# Patient Record
Sex: Female | Born: 1965
Health system: Southern US, Community
[De-identification: ages and names within clinical notes are randomized; demographics above are authoritative.]

## PROBLEM LIST (undated history)

## (undated) DIAGNOSIS — G4700 Insomnia, unspecified: Secondary | ICD-10-CM

## (undated) DIAGNOSIS — M8430XA Stress fracture, unspecified site, initial encounter for fracture: Secondary | ICD-10-CM

## (undated) DIAGNOSIS — N946 Dysmenorrhea, unspecified: Secondary | ICD-10-CM

## (undated) DIAGNOSIS — E782 Mixed hyperlipidemia: Secondary | ICD-10-CM

## (undated) DIAGNOSIS — F411 Generalized anxiety disorder: Secondary | ICD-10-CM

## (undated) DIAGNOSIS — F329 Major depressive disorder, single episode, unspecified: Secondary | ICD-10-CM

## (undated) DIAGNOSIS — K760 Fatty (change of) liver, not elsewhere classified: Secondary | ICD-10-CM

## (undated) DIAGNOSIS — F32A Depression, unspecified: Secondary | ICD-10-CM

## (undated) DIAGNOSIS — T7840XA Allergy, unspecified, initial encounter: Secondary | ICD-10-CM

## (undated) DIAGNOSIS — N951 Menopausal and female climacteric states: Secondary | ICD-10-CM

## (undated) DIAGNOSIS — D259 Leiomyoma of uterus, unspecified: Secondary | ICD-10-CM

## (undated) DIAGNOSIS — M47816 Spondylosis without myelopathy or radiculopathy, lumbar region: Secondary | ICD-10-CM

## (undated) DIAGNOSIS — R1013 Epigastric pain: Secondary | ICD-10-CM

## (undated) DIAGNOSIS — J302 Other seasonal allergic rhinitis: Secondary | ICD-10-CM

## (undated) DIAGNOSIS — O24419 Gestational diabetes mellitus in pregnancy, unspecified control: Secondary | ICD-10-CM

## (undated) DIAGNOSIS — M542 Cervicalgia: Secondary | ICD-10-CM

## (undated) DIAGNOSIS — B009 Herpesviral infection, unspecified: Secondary | ICD-10-CM

## (undated) DIAGNOSIS — N920 Excessive and frequent menstruation with regular cycle: Secondary | ICD-10-CM

## (undated) DIAGNOSIS — R140 Abdominal distension (gaseous): Secondary | ICD-10-CM

## (undated) DIAGNOSIS — K589 Irritable bowel syndrome without diarrhea: Secondary | ICD-10-CM

## (undated) DIAGNOSIS — Z86010 Personal history of colonic polyps: Secondary | ICD-10-CM

## (undated) DIAGNOSIS — M533 Sacrococcygeal disorders, not elsewhere classified: Secondary | ICD-10-CM

## (undated) HISTORY — DX: Insomnia, unspecified: G47.00

## (undated) HISTORY — DX: Excessive and frequent menstruation with regular cycle: N92.0

## (undated) HISTORY — DX: Major depressive disorder, single episode, unspecified: F32.9

## (undated) HISTORY — DX: Generalized anxiety disorder: F41.1

## (undated) HISTORY — DX: Abdominal distension (gaseous): R14.0

## (undated) HISTORY — DX: Gestational diabetes mellitus in pregnancy, unspecified control: O24.419

## (undated) HISTORY — DX: Leiomyoma of uterus, unspecified: D25.9

## (undated) HISTORY — DX: Personal history of colonic polyps: Z86.010

## (undated) HISTORY — DX: Herpesviral infection, unspecified: B00.9

## (undated) HISTORY — DX: Sacrococcygeal disorders, not elsewhere classified: M53.3

## (undated) HISTORY — DX: Fatty (change of) liver, not elsewhere classified: K76.0

## (undated) HISTORY — DX: Irritable bowel syndrome, unspecified: K58.9

## (undated) HISTORY — DX: Menopausal and female climacteric states: N95.1

## (undated) HISTORY — DX: Allergy, unspecified, initial encounter: T78.40XA

## (undated) HISTORY — DX: Dysmenorrhea, unspecified: N94.6

## (undated) HISTORY — DX: Depression, unspecified: F32.A

## (undated) HISTORY — DX: Epigastric pain: R10.13

## (undated) HISTORY — PX: EYE SURGERY: SHX253

## (undated) HISTORY — DX: Stress fracture, unspecified site, initial encounter for fracture: M84.30XA

## (undated) HISTORY — DX: Cervicalgia: M54.2

## (undated) HISTORY — DX: Mixed hyperlipidemia: E78.2

## (undated) HISTORY — DX: Spondylosis without myelopathy or radiculopathy, lumbar region: M47.816

## (undated) HISTORY — DX: Other seasonal allergic rhinitis: J30.2

---

## 1998-01-11 ENCOUNTER — Other Ambulatory Visit: Admission: RE | Admit: 1998-01-11 | Discharge: 1998-01-11 | Payer: Self-pay | Admitting: Gynecology

## 1999-01-31 ENCOUNTER — Other Ambulatory Visit: Admission: RE | Admit: 1999-01-31 | Discharge: 1999-01-31 | Payer: Self-pay | Admitting: Gynecology

## 2000-02-09 ENCOUNTER — Other Ambulatory Visit: Admission: RE | Admit: 2000-02-09 | Discharge: 2000-02-09 | Payer: Self-pay | Admitting: Obstetrics and Gynecology

## 2001-04-04 ENCOUNTER — Other Ambulatory Visit: Admission: RE | Admit: 2001-04-04 | Discharge: 2001-04-04 | Payer: Self-pay | Admitting: Gynecology

## 2002-04-24 ENCOUNTER — Other Ambulatory Visit: Admission: RE | Admit: 2002-04-24 | Discharge: 2002-04-24 | Payer: Self-pay | Admitting: Obstetrics and Gynecology

## 2002-11-26 ENCOUNTER — Encounter: Payer: Self-pay | Admitting: Gynecology

## 2002-11-26 ENCOUNTER — Ambulatory Visit (HOSPITAL_COMMUNITY): Admission: RE | Admit: 2002-11-26 | Discharge: 2002-11-26 | Payer: Self-pay | Admitting: Gynecology

## 2003-06-15 ENCOUNTER — Other Ambulatory Visit: Admission: RE | Admit: 2003-06-15 | Discharge: 2003-06-15 | Payer: Self-pay | Admitting: Gynecology

## 2004-07-06 ENCOUNTER — Other Ambulatory Visit: Admission: RE | Admit: 2004-07-06 | Discharge: 2004-07-06 | Payer: Self-pay | Admitting: Gynecology

## 2004-07-06 ENCOUNTER — Ambulatory Visit (HOSPITAL_COMMUNITY): Admission: RE | Admit: 2004-07-06 | Discharge: 2004-07-06 | Payer: Self-pay | Admitting: Obstetrics and Gynecology

## 2005-09-01 ENCOUNTER — Other Ambulatory Visit: Admission: RE | Admit: 2005-09-01 | Discharge: 2005-09-01 | Payer: Self-pay | Admitting: Gynecology

## 2006-01-26 ENCOUNTER — Ambulatory Visit (HOSPITAL_COMMUNITY): Admission: RE | Admit: 2006-01-26 | Discharge: 2006-01-26 | Payer: Self-pay | Admitting: Obstetrics and Gynecology

## 2006-07-03 DIAGNOSIS — M8430XA Stress fracture, unspecified site, initial encounter for fracture: Secondary | ICD-10-CM

## 2006-07-03 HISTORY — DX: Stress fracture, unspecified site, initial encounter for fracture: M84.30XA

## 2006-09-07 ENCOUNTER — Other Ambulatory Visit: Admission: RE | Admit: 2006-09-07 | Discharge: 2006-09-07 | Payer: Self-pay | Admitting: Gynecology

## 2007-04-22 ENCOUNTER — Ambulatory Visit (HOSPITAL_COMMUNITY): Admission: RE | Admit: 2007-04-22 | Discharge: 2007-04-22 | Payer: Self-pay | Admitting: Gynecology

## 2008-02-13 ENCOUNTER — Other Ambulatory Visit: Admission: RE | Admit: 2008-02-13 | Discharge: 2008-02-13 | Payer: Self-pay | Admitting: Gynecology

## 2008-05-04 ENCOUNTER — Ambulatory Visit: Payer: Self-pay | Admitting: Women's Health

## 2008-05-19 ENCOUNTER — Ambulatory Visit (HOSPITAL_COMMUNITY): Admission: RE | Admit: 2008-05-19 | Discharge: 2008-05-19 | Payer: Self-pay | Admitting: Gynecology

## 2008-06-30 ENCOUNTER — Ambulatory Visit: Payer: Self-pay | Admitting: Women's Health

## 2008-07-03 HISTORY — PX: ENDOMETRIAL ABLATION: SHX621

## 2008-07-16 ENCOUNTER — Ambulatory Visit: Payer: Self-pay | Admitting: Gynecology

## 2008-07-23 ENCOUNTER — Ambulatory Visit: Payer: Self-pay | Admitting: Gynecology

## 2008-08-27 ENCOUNTER — Ambulatory Visit: Payer: Self-pay | Admitting: Gynecology

## 2008-08-28 ENCOUNTER — Ambulatory Visit: Payer: Self-pay | Admitting: Gynecology

## 2008-09-15 ENCOUNTER — Ambulatory Visit: Payer: Self-pay | Admitting: Gynecology

## 2009-02-25 ENCOUNTER — Ambulatory Visit: Payer: Self-pay | Admitting: Women's Health

## 2009-02-25 ENCOUNTER — Other Ambulatory Visit: Admission: RE | Admit: 2009-02-25 | Discharge: 2009-02-25 | Payer: Self-pay | Admitting: Gynecology

## 2009-02-25 ENCOUNTER — Encounter: Payer: Self-pay | Admitting: Women's Health

## 2009-06-02 ENCOUNTER — Ambulatory Visit (HOSPITAL_COMMUNITY): Admission: RE | Admit: 2009-06-02 | Discharge: 2009-06-02 | Payer: Self-pay | Admitting: Gynecology

## 2010-01-31 LAB — CONVERTED CEMR LAB

## 2010-03-02 ENCOUNTER — Other Ambulatory Visit: Admission: RE | Admit: 2010-03-02 | Discharge: 2010-03-02 | Payer: Self-pay | Admitting: Gynecology

## 2010-03-02 ENCOUNTER — Encounter (INDEPENDENT_AMBULATORY_CARE_PROVIDER_SITE_OTHER): Payer: Self-pay | Admitting: *Deleted

## 2010-03-02 ENCOUNTER — Ambulatory Visit: Payer: Self-pay | Admitting: Women's Health

## 2010-03-02 LAB — CONVERTED CEMR LAB
Glucose, Bld: 86 mg/dL
HCT: 40.5 %
Hemoglobin: 13.3 g/dL
Lymphocytes, automated: 27 %
MCV: 90.1 fL
Platelets: 329 10*3/uL
RBC: 4.5 M/uL
RDW: 13.8 %
TSH: 0.54 microintl units/mL
WBC: 7 10*3/uL

## 2010-03-28 ENCOUNTER — Ambulatory Visit: Payer: Self-pay | Admitting: Women's Health

## 2010-06-15 ENCOUNTER — Ambulatory Visit (HOSPITAL_COMMUNITY)
Admission: RE | Admit: 2010-06-15 | Discharge: 2010-06-15 | Payer: Self-pay | Source: Home / Self Care | Attending: Gynecology | Admitting: Gynecology

## 2010-06-15 LAB — HM MAMMOGRAPHY: HM Mammogram: NEGATIVE

## 2010-07-05 ENCOUNTER — Ambulatory Visit
Admission: RE | Admit: 2010-07-05 | Discharge: 2010-07-05 | Payer: Self-pay | Source: Home / Self Care | Attending: Family Medicine | Admitting: Family Medicine

## 2010-07-05 ENCOUNTER — Encounter: Payer: Self-pay | Admitting: Family Medicine

## 2010-07-05 ENCOUNTER — Telehealth: Payer: Self-pay | Admitting: Family Medicine

## 2010-07-05 DIAGNOSIS — J329 Chronic sinusitis, unspecified: Secondary | ICD-10-CM | POA: Insufficient documentation

## 2010-07-05 DIAGNOSIS — F341 Dysthymic disorder: Secondary | ICD-10-CM | POA: Insufficient documentation

## 2010-07-12 ENCOUNTER — Encounter (INDEPENDENT_AMBULATORY_CARE_PROVIDER_SITE_OTHER): Payer: Self-pay | Admitting: *Deleted

## 2010-07-12 ENCOUNTER — Telehealth (INDEPENDENT_AMBULATORY_CARE_PROVIDER_SITE_OTHER): Payer: Self-pay | Admitting: *Deleted

## 2010-08-02 ENCOUNTER — Encounter: Payer: Self-pay | Admitting: Family Medicine

## 2010-08-04 NOTE — Letter (Signed)
Summary: Records Dated 09-04-96 thru 06-15-10/[facility]  Records Dated 09-04-96 thru 06-15-10/[facility]   Imported By: Lanelle Bal 07/20/2010 08:28:38  _____________________________________________________________________  External Attachment:    Type:   Image     Comment:   External Document

## 2010-08-04 NOTE — Progress Notes (Signed)
Summary: Lab results    Mammogram  Procedure date:  06/15/2010  Findings:       Assessment: BIRADS 1-negative   Comments:      Screening mammogram in 1 year.     Mammogram  Procedure date:  06/02/2009  Findings:       Assessment: BIRADS 1-negative   Comments:      Screening mammogram in 1 year.     -  Date:  03/02/2010    BG Random: 86    WBC: 7.0    HGB: 13.3    HCT: 40.5    RBC: 4.50    PLT: 329    MCV: 90.1    RDW: 13.8    Lymphs: 27.0    TSH: 0.54

## 2010-08-04 NOTE — Progress Notes (Signed)
Summary: Medical Record Request  Phone Note Outgoing Call Call back at [phone]   Call placed by: Lannette Donath,  July 05, 2010 5:01 PM Summary of Call: Faxed medical record request 07-05-10 Initial call taken by: Lannette Donath,  July 05, 2010 5:02 PM

## 2010-08-04 NOTE — Assessment & Plan Note (Signed)
Summary: CPX/VFW   Vital Signs:  Patient profile:   45 year old female Menstrual status:  regular LMP:     06/28/2010 Height:      63.25 inches (160.66 cm) Weight:      123.50 pounds (56.14 kg) BMI:     21.78 O2 Sat:      97 % on Room air Temp:     98.4 degrees F (36.89 degrees C) oral Pulse rate:   74 / minute BP sitting:   130 / 84  (right arm) Cuff size:   regular  Vitals Entered By: Josph Macho RMA (July 05, 2010 1:20 PM)  O2 Flow:  Room air CC: Establish new patient/ physical - no pap/ CF Is Patient Diabetic? No LMP (date): 06/28/2010     Menstrual Status regular Enter LMP: 06/28/2010 Last PAP Result historical   History of Present Illness: patient is a 45 year old Caucasian female in today to establish care. She previously has been seen by [facility] gynecology pregnancy down and has been seen in urgent care several times over the last couple years for sinus infections. At this point she feels she needs a primary care doctor to help her manage her conditions. Her biggest problem over the last year subsequent stress and anxiety. She is having a lot of marital stress segment elevations on for counseling and she's also. Her 2 teenagers in her home and working as well. She found counseling was helpful at a time but does feel she's doing better at this time. Her gynecologist was kind enough to put her on some sertraline 50 mg daily and she takes a very infrequent dose of Xanax and that she feels is controlling her symptoms well. She has a strong family history of anxiety and depression as well as substance abuse and is aware that she needs to be proactive regarding these conditions. She reports otherwise been in good health other than her recurrent sinus infections. She notes several sinus infections requiring antibiotics occurred in the last couple years and she is considering referral to ENT for further evaluation. She does not want one yet today secondary to she hasn't had  an infection in several months. She denies fevers, chills, headache, congestion, chest pain, palpitations, shortness of breath, GI or GU complaints at this time. She did struggle with Gestational Diabetes, was diet controlled and she has not had any futher trouble with her sugar since then. Reports her previous fasting labs have always been acceptable.  Preventive Screening-Counseling & Management  Alcohol-Tobacco     Smoking Status: never  Caffeine-Diet-Exercise     Does Patient Exercise: yes      Drug Use:  no.    Current Medications (verified): 1)  Xanax 0.25 Mg Tabs (Alprazolam) .... As Needed 2)  Zoloft 50 Mg Tabs (Sertraline Hcl) .... Once Daily  Allergies (verified): No Known Drug Allergies  Past History:  Past Surgical History: endometrial ablation, 2010 for menorrhaghia Caesarean section x 1 in 1997 right eye surgery for strabismus at age 45   Family History: Father: 37, A&W, SAD Mother: 75, DMII, HTN, hyperlipidemia, elevated BMI, depression Siblings:  Brother: 65, multiple addictions, cocaine, vicodin, pain killers, bipolar disorder MGM: deceased in 44s, gyn cancer possibly cervical or uterine MGF: deceased@82 , DMII, elevated BMI, HTN, depression, substance dependence. PGM: deceased in 81s or 25s, unknown causes PGF: deceased in 45s or 82s, alcoholism Children: Son: 47 yo, ADHD, dysgraphia, depression Daughter: 70yo, A&W  Social History: Occupation: Psychologist, prison and probation services  Married Never Smoked Drug  use-no Regular exercise-no Alcohol use-yes, rare, special occasions Wears seat belt No dietary restrictionsOccupation:  employed Smoking Status:  never Drug Use:  no Does Patient Exercise:  yes  Review of Systems       The patient complains of depression.  The patient denies anorexia, fever, weight loss, weight gain, vision loss, decreased hearing, hoarseness, chest pain, syncope, dyspnea on exertion, peripheral edema, prolonged cough, headaches, hemoptysis,  abdominal pain, melena, hematochezia, severe indigestion/heartburn, hematuria, incontinence, genital sores, muscle weakness, suspicious skin lesions, transient blindness, difficulty walking, unusual weight change, abnormal bleeding, and enlarged lymph nodes.    Physical Exam  General:  Well-developed,well-nourished,in no acute distress; alert,appropriate and cooperative throughout examination Head:  Normocephalic and atraumatic without obvious abnormalities. No apparent alopecia or balding. Eyes:  No corneal or conjunctival inflammation noted. EOMI. Perrla. Funduscopic exam benign, without hemorrhages, exudates or papilledema. Vision grossly normal. Nose:  External nasal examination shows no deformity or inflammation. Nasal mucosa are pink and moist without lesions or exudates. Mouth:  Oral mucosa and oropharynx without lesions or exudates.  Teeth in good repair. Neck:  No deformities, masses, or tenderness noted. Lungs:  Normal respiratory effort, chest expands symmetrically. Lungs are clear to auscultation, no crackles or wheezes. Heart:  Normal rate and regular rhythm. S1 and S2 normal without gallop, murmur, click, rub or other extra sounds. Abdomen:  Bowel sounds positive,abdomen soft and non-tender without masses, organomegaly or hernias noted. Msk:  No deformity or scoliosis noted of thoracic or lumbar spine.   Pulses:  R and L carotid,radial,femoral,dorsalis pedis and posterior tibial pulses are full and equal bilaterally Extremities:  No clubbing, cyanosis, edema, or deformity noted with normal full range of motion of all joints.   Neurologic:  No cranial nerve deficits noted. Station and gait are normal. Plantar reflexes are down-going bilaterally. DTRs are symmetrical throughout. Sensory, motor and coordinative functions appear intact. Skin:  Intact without suspicious lesions or rashes Cervical Nodes:  No lymphadenopathy noted Psych:  Cognition and judgment appear intact. Alert and  cooperative with normal attention span and concentration. No apparent delusions, illusions, hallucinations   Impression & Recommendations:  Problem # 1:  ANXIETY DEPRESSION (ICD-300.4) Patient feels her symptoms are well controlled at this time on Sertraline 50mg  by mouth once daily and a very rare dose of Alprazolam, both of which are prescribed by her OB/GYN. She has done some counselling in the past year when she was struggling with some marital difficulties but at this time feels she is doing better and is not undergoing any further counselling she will return to this as needed.  Problem # 2:  SINUSITIS, RECURRENT (ICD-473.9) Has been seen in Urgent Care several times over the past several years for sinus infections. Is considering a referral to ENT for futher evaluation but has not had an infection in several months so would like to wait on referral unless another infection develops.  Complete Medication List: 1)  Xanax 0.25 Mg Tabs (Alprazolam) .... As needed 2)  Zoloft 50 Mg Tabs (Sertraline hcl) .... Once daily  Patient Instructions: 1)  Please schedule a follow-up appointment in 1 year or as needed. 2)  Release of Records from OB/GYN   Orders Added: 1)  New Patient Level III [29528]    Preventive Care Screening  Mammogram:    Date:  06/02/2010    Results:  historical   Pap Smear:    Date:  01/31/2010    Results:  historical

## 2010-10-20 ENCOUNTER — Other Ambulatory Visit: Payer: Self-pay | Admitting: Plastic Surgery

## 2010-12-02 HISTORY — PX: OTHER SURGICAL HISTORY: SHX169

## 2011-01-16 ENCOUNTER — Ambulatory Visit (INDEPENDENT_AMBULATORY_CARE_PROVIDER_SITE_OTHER): Payer: BC Managed Care – PPO | Admitting: Family Medicine

## 2011-01-16 ENCOUNTER — Encounter: Payer: Self-pay | Admitting: Family Medicine

## 2011-01-16 VITALS — BP 105/65 | HR 89 | Temp 98.3°F | Ht 63.25 in | Wt 127.0 lb

## 2011-01-16 DIAGNOSIS — H60399 Other infective otitis externa, unspecified ear: Secondary | ICD-10-CM

## 2011-01-16 DIAGNOSIS — H6090 Unspecified otitis externa, unspecified ear: Secondary | ICD-10-CM

## 2011-01-16 DIAGNOSIS — H6092 Unspecified otitis externa, left ear: Secondary | ICD-10-CM

## 2011-01-16 MED ORDER — HYDROCORTISONE-ACETIC ACID 1-2 % OT SOLN: 3.0000 [drp] | Freq: Two times a day (BID) | OTIC | Status: AC

## 2011-01-16 NOTE — Progress Notes (Signed)
Karen Oconnell [id number] [birth year] 01/16/2011      Progress Note-Follow Up  Subjective  Chief Complaint  Chief Complaint  Patient presents with  . Otitis Media    X 1 week - both ears mostly left    HPI  Patient is a 45 year old Caucasian female who is in today for evaluation of ear symptoms. She'll been struggling with left ear pressure and a sense of some swelling for about a week. She describes is a muffled sound. Has been swimming a lot and in and out of the water. Has some mild itching is also noted but no discharge. Some pain when she presses on it is noted. She denies similar symptoms on the right until the last day or so when she's begun to have some mild pressure in the right ear. No tinnitus no change in hearing. No fevers, chills, headache. She does have some mild nasal congestion but no rhinorrhea. Denies sore throat, cough, chest pain, palpitations, shortness of breath, GI or GU complaints. She has tried cleaning her ears with Q-tips and alcohol and this has not been notably helpful so she is here today for evaluation.  Past Medical History  Diagnosis Date  . Depression   . Anxiety   . Allergy     seasonal  . Chicken pox as a child  . Gestational diabetes   . Otitis externa 01/16/2011    Past Surgical History  Procedure Date  . Cesarean section 1997    X 1  . Eye surgery age 66    right eye for strabismus  . Endometrial ablation 2010    menorrhaghia  . Cyst removed from back of right leg 6-12    Family History  Problem Relation Age of Onset  . Diabetes Mother     Type 2  . Hypertension Mother   . Hyperlipidemia Mother   . Depression Mother   . Seasonal affective disorder Father   . Bipolar disorder Brother   . Drug abuse Brother     cocaine, vicodin, pain killers  . ADD / ADHD Son   . Depression Son   . Cancer Maternal Grandmother     gyn possibly cervical or uterine  . Diabetes Maternal Grandfather     Type 2  . Hypertension Maternal Grandfather    . Depression Maternal Grandfather   . Alcohol abuse Paternal Grandfather     History   Social History  . Marital Status: Married    Spouse Name: N/A    Number of Children: N/A  . Years of Education: N/A   Occupational History  . Not on file.   Social History Main Topics  . Smoking status: Never Smoker   . Smokeless tobacco: Never Used  . Alcohol Use: 0.0 oz/week    0 drink(s) per week     occasionaly  . Drug Use: No  . Sexually Active: Not on file   Other Topics Concern  . Not on file   Social History Narrative  . No narrative on file    Current Outpatient Prescriptions on File Prior to Visit  Medication Sig Dispense Refill  . ALPRAZolam (XANAX) 0.25 MG tablet Take 0.25 mg by mouth at bedtime as needed.        . sertraline (ZOLOFT) 50 MG tablet Take 50 mg by mouth daily.          No Known Allergies  Review of Systems  Review of Systems  Constitutional: Negative for fever, chills and malaise/fatigue.  HENT:  Positive for ear pain and congestion. Negative for hearing loss, nosebleeds, sore throat, tinnitus and ear discharge.        Has been swimming frequently and the pressure in the left ear has been building for a week andnow the right ear is having some mild increased pressure as well  Eyes: Negative for pain and discharge.  Respiratory: Negative for cough, hemoptysis, sputum production and shortness of breath.   Cardiovascular: Negative for chest pain, palpitations and leg swelling.  Gastrointestinal: Negative for nausea, abdominal pain and diarrhea.  Genitourinary: Negative for dysuria.  Musculoskeletal: Negative for falls.  Skin: Negative for rash.  Neurological: Negative for loss of consciousness and headaches.  Endo/Heme/Allergies: Negative for polydipsia.  Psychiatric/Behavioral: Negative for depression and suicidal ideas. The patient is not nervous/anxious and does not have insomnia.     Objective  BP 105/65  Pulse 89  Temp(Src) 98.3 F (36.8 C)  (Oral)  Ht 5' 3.25" (1.607 m)  Wt 127 lb (57.607 kg)  BMI 22.32 kg/m2  SpO2 97%  LMP 01/02/2011  Physical Exam  Physical Exam  Constitutional: She is oriented to person, place, and time and well-developed, well-nourished, and in no distress. No distress.  HENT:  Head: Normocephalic and atraumatic.  Right Ear: External ear normal.  Left Ear: External ear normal.  Nose: Nose normal.  Mouth/Throat: Oropharynx is clear and moist. No oropharyngeal exudate.       Slight clear fluid behind b/l TMs, TMs retracted but not dull or erythematous. Skin in left external canal mildly injected and swollen  Eyes: Conjunctivae are normal.  Neck: Neck supple. No thyromegaly present.  Cardiovascular: Normal rate, regular rhythm and normal heart sounds.   No murmur heard. Pulmonary/Chest: Effort normal and breath sounds normal. She has no wheezes.  Abdominal: She exhibits no distension and no mass.  Musculoskeletal: She exhibits no edema.  Lymphadenopathy:    She has no cervical adenopathy.  Neurological: She is alert and oriented to person, place, and time.  Skin: Skin is warm and dry. No rash noted. She is not diaphoretic.  Psychiatric: Memory, affect and judgment normal.    Lab Results  Component Value Date   TSH 0.54 03/02/2010   Lab Results  Component Value Date   WBC 7.0 03/02/2010   HGB 13.3 03/02/2010   HCT 40.5 03/02/2010   MCV 90.1 03/02/2010   PLT 329 03/02/2010     Assessment & Plan  Otitis externa Mild, given an rx for Vosol HC, if this becomes recurrent try Swimmer's ear drops, is struggling with mild nasal congestion as well but no rhinorrhea or pruritus, try nasal saline prn for this and call if symptoms worsen.

## 2011-01-16 NOTE — Patient Instructions (Signed)
Swimmer's Ear (Otitis Externa) Otitis externa ("swimmer's ear") is a germ (bacterial) or fungal infection of the outer ear canal (from the eardrum to the outside of the ear). Swimming in dirty water may cause swimmer's ear. It also may be caused by moisture in the ear from water remaining after swimming or bathing. Often the first signs of infection may be itching in the ear canal. This may progress to ear canal swelling, redness, and pus drainage which may be signs of infection. HOME CARE INSTRUCTIONS  Apply the antibiotic drops to the ear canal as prescribed by your doctor.   This can be a very painful medical condition. A strong pain reliever may be prescribed.   Only take over-the-counter or prescription medicines for pain, discomfort, or fever as directed by your caregiver.   If your caregiver has given you a follow-up appointment, it is very important to keep that appointment. Not keeping the appointment could result in a chronic or permanent injury, pain, hearing loss and disability. If there is any problem keeping the appointment, you must call back to this facility for assistance.  PREVENTION  It is important to keep your ear dry. Use the corner of a towel to wick water out of the ear canal after swimming or bathing.   Avoid scratching in your ear. This can damage the ear canal or remove the protective wax lining the canal and make it easier for germs (bacteria) or a fungus to grow.   You may use ear drops made of rubbing alcohol and vinegar after swimming to prevent future "swimmer ear" infections. Make up a small bottle of equal parts white vinegar and alcohol. Put 3 or 4 drops into each ear after swimming.   Avoid swimming in lakes, polluted water, or poorly chlorinated pools.  SEEK MEDICAL CARE IF:  An oral temperature above 104 develops.   Your ear is still painful after 3 days and shows signs of getting worse (redness, swelling, pain, or pus).  MAKE SURE YOU:   Understand  these instructions.   Will watch your condition.   Will get help right away if you are not doing well or get worse.  Document Released: 06/19/2005 Document Re-Released: 06/01/2008 [facility] Patient Information 2011 [place], [place].  Consider nasal saline twice daily for nasal congestion In future if this recures consider Swimmer's ear drops after each swim

## 2011-01-16 NOTE — Assessment & Plan Note (Signed)
Mild, given an rx for Vosol HC, if this becomes recurrent try Swimmer's ear drops, is struggling with mild nasal congestion as well but no rhinorrhea or pruritus, try nasal saline prn for this and call if symptoms worsen.

## 2011-02-06 ENCOUNTER — Encounter: Payer: Self-pay | Admitting: Family Medicine

## 2011-02-06 ENCOUNTER — Ambulatory Visit (INDEPENDENT_AMBULATORY_CARE_PROVIDER_SITE_OTHER): Payer: BC Managed Care – PPO | Admitting: Family Medicine

## 2011-02-06 DIAGNOSIS — M542 Cervicalgia: Secondary | ICD-10-CM

## 2011-02-06 DIAGNOSIS — T7840XA Allergy, unspecified, initial encounter: Secondary | ICD-10-CM

## 2011-02-06 DIAGNOSIS — H6090 Unspecified otitis externa, unspecified ear: Secondary | ICD-10-CM

## 2011-02-06 DIAGNOSIS — H60399 Other infective otitis externa, unspecified ear: Secondary | ICD-10-CM

## 2011-02-06 HISTORY — DX: Cervicalgia: M54.2

## 2011-02-06 MED ORDER — LORATADINE 10 MG PO TABS: 10.0000 mg | ORAL_TABLET | Freq: Every day | ORAL | Status: DC | PRN

## 2011-02-06 MED ORDER — MELOXICAM 7.5 MG PO TABS: ORAL_TABLET | ORAL | Status: DC

## 2011-02-06 MED ORDER — CYCLOBENZAPRINE HCL 10 MG PO TABS: 10.0000 mg | ORAL_TABLET | Freq: Three times a day (TID) | ORAL | Status: DC | PRN

## 2011-02-06 MED ORDER — GUAIFENESIN ER 600 MG PO TB12: ORAL_TABLET | ORAL | Status: DC

## 2011-02-06 NOTE — Progress Notes (Signed)
Karen Oconnell [id number] [birth year] 02/06/2011      Progress Note-Follow Up  Subjective  Chief Complaint  Chief Complaint  Patient presents with  . Neck Pain    hurts to turn neck either way X 1 week    HPI  45 year old Caucasian female who is in today for evaluation of neck pain. 2 weeks ago she was in [place] visiting her in-laws and does not about mid week ago when she developed a mild sore throat. She did not have any pain fevers or chills but she did have some nasal congestion with pruritus. She denies cough, chest pain, palpitations, shortness of breath or GI complaints at that time. She does believe the apartment was very dusty and she had a response. She was taking some Claritin but is no longer taking that now. The sore throat is improving somewhat but the congestion persists. Neck pain began about a week ago. She did drive to and from [place] and shortly after coming home started having spells in the left side of her neck. She notes it is difficult to find a comfortable position she denies any radicular symptoms. She notes turning her head is difficult and driving is becoming more difficult as a result. She has trouble finding a comfortable position at night and when she changes positions it will awaken her. She denies any acute trauma or injury So far she has tried Aleve 440mg  bid with only partial relief and she had some left over Morphine at home from a Dermatology procedure and that did help her sleep through the night one night but then the pain was still present in the am.  Past Medical History  Diagnosis Date  . Depression   . Anxiety   . Allergy     seasonal  . Chicken pox as a child  . Gestational diabetes   . Otitis externa 01/16/2011  . Allergic state 02/06/2011  . Neck pain, musculoskeletal 02/06/2011    Past Surgical History  Procedure Date  . Cesarean section 1997    X 1  . Eye surgery age 5    right eye for strabismus  . Endometrial ablation 2010   menorrhaghia  . Cyst removed from back of right leg 6-12    Family History  Problem Relation Age of Onset  . Diabetes Mother     Type 2  . Hypertension Mother   . Hyperlipidemia Mother   . Depression Mother   . Seasonal affective disorder Father   . Bipolar disorder Brother   . Drug abuse Brother     cocaine, vicodin, pain killers  . ADD / ADHD Son   . Depression Son   . Cancer Maternal Grandmother     gyn possibly cervical or uterine  . Diabetes Maternal Grandfather     Type 2  . Hypertension Maternal Grandfather   . Depression Maternal Grandfather   . Alcohol abuse Paternal Grandfather     History   Social History  . Marital Status: Married    Spouse Name: N/A    Number of Children: N/A  . Years of Education: N/A   Occupational History  . Not on file.   Social History Main Topics  . Smoking status: Never Smoker   . Smokeless tobacco: Never Used  . Alcohol Use: 0.0 oz/week    0 drink(s) per week     occasionaly  . Drug Use: No  . Sexually Active: Not on file   Other Topics Concern  .  Not on file   Social History Narrative  . No narrative on file    Current Outpatient Prescriptions on File Prior to Visit  Medication Sig Dispense Refill  . ALPRAZolam (XANAX) 0.25 MG tablet Take 0.25 mg by mouth at bedtime as needed.        . sertraline (ZOLOFT) 50 MG tablet Take 50 mg by mouth daily.          No Known Allergies  Review of Systems  Review of Systems  Constitutional: Negative for fever, chills and malaise/fatigue.  HENT: Positive for congestion, sore throat and neck pain. Negative for ear pain, nosebleeds and ear discharge.   Eyes: Negative for discharge.  Respiratory: Negative for cough, sputum production and shortness of breath.   Cardiovascular: Negative for chest pain, palpitations and leg swelling.  Gastrointestinal: Negative for nausea, abdominal pain and diarrhea.  Genitourinary: Negative for dysuria.  Musculoskeletal: Negative for back pain  and falls.  Skin: Negative for rash.  Neurological: Negative for tingling, sensory change, focal weakness, loss of consciousness and headaches.  Endo/Heme/Allergies: Negative for polydipsia.  Psychiatric/Behavioral: Negative for depression and suicidal ideas. The patient is not nervous/anxious and does not have insomnia.     Objective  BP 127/87  Pulse 83  Temp(Src) 98.3 F (36.8 C) (Oral)  Ht 5' 3.25" (1.607 m)  Wt 26 lb 6.4 oz (11.975 kg)  BMI 4.64 kg/m2  SpO2 97%  LMP 01/30/2011  Physical Exam  Physical Exam  Constitutional: She is oriented to person, place, and time and well-developed, well-nourished, and in no distress. No distress.  HENT:  Head: Normocephalic and atraumatic.  Eyes: Conjunctivae are normal.  Neck: Neck supple. No thyromegaly present.       Left cervical LN enlarged and mildly tender to palp. SCM muscle tight and tender to palp on left side of neck  Cardiovascular: Normal rate, regular rhythm and normal heart sounds.   No murmur heard. Pulmonary/Chest: Effort normal and breath sounds normal. She has no wheezes.  Abdominal: She exhibits no distension and no mass.  Musculoskeletal: She exhibits no edema.  Lymphadenopathy:    She has no cervical adenopathy.  Neurological: She is alert and oriented to person, place, and time.  Skin: Skin is warm and dry. No rash noted. She is not diaphoretic.  Psychiatric: Memory, affect and judgment normal.    Lab Results  Component Value Date   TSH 0.54 03/02/2010   Lab Results  Component Value Date   WBC 7.0 03/02/2010   HGB 13.3 03/02/2010   HCT 40.5 03/02/2010   MCV 90.1 03/02/2010   PLT 329 03/02/2010     Assessment & Plan  Allergic state From a trip to [place] to visit her in months and had an allergic flare with lots of dust in the environment. She is encouraged to take Claritin daily for the next 1-2 weeks and Mucinex twice a day as well. Call if symptoms worsen congestion persists fevers green  rhinorrhea develop for further treatment.  Otitis externa Resolved, ear looks clear today  Neck pain, musculoskeletal Pain has been present about a week, muscle spasm noted in SCM muscle, encouraged moist heat and gentle stretching. Meloxicam and Flexeril are prescribed and call if symptoms persist

## 2011-02-06 NOTE — Assessment & Plan Note (Signed)
Pain has been present about a week, muscle spasm noted in SCM muscle, encouraged moist heat and gentle stretching. Meloxicam and Flexeril are prescribed and call if symptoms persist

## 2011-02-06 NOTE — Assessment & Plan Note (Signed)
From a trip to [place] to visit her in months and had an allergic flare with lots of dust in the environment. She is encouraged to take Claritin daily for the next 1-2 weeks and Mucinex twice a day as well. Call if symptoms worsen congestion persists fevers green rhinorrhea develop for further treatment.

## 2011-02-06 NOTE — Patient Instructions (Signed)
Back Pain (Lumbosacral Strain) Back pain is one of the most common causes of pain. There are many causes of back pain. Most are not serious conditions.  CAUSES Your backbone (spinal column) is made up of 24 main vertebral bodies, the sacrum, and the coccyx. These are held together by muscles and tough, fibrous tissue (ligaments). Nerve roots pass through the openings between the vertebrae. A sudden move or injury to the back may cause injury to, or pressure on, these nerves. This may result in localized back pain or pain movement (radiation) into the buttocks, down the leg, and into the foot. Sharp, shooting pain from the buttock down the back of the leg (sciatica) is frequently associated with a ruptured (herniated) disc. Pain may be caused by muscle spasm alone. Your caregiver can often find the cause of your pain by the details of your symptoms and an exam. In some cases, you may need tests (such as X-rays). Your caregiver will work with you to decide if any tests are needed based on your specific exam. HOME CARE INSTRUCTIONS  Avoid an underactive lifestyle. Active exercise, as directed by your caregiver, is your greatest weapon against back pain.   Avoid hard physical activities (tennis, racquetball, water-skiing) if you are not in proper physical condition for it. This may aggravate and/or create problems.   If you have a back problem, avoid sports requiring sudden body movements. Swimming and walking are generally safer activities.   Maintain good posture.   Avoid becoming overweight (obese).   Use bed rest for only the most extreme, sudden (acute) episode. Your caregiver will help you determine how much bed rest is necessary.   For acute conditions, you may put ice on the injured area.   Put ice in a plastic bag.   Place a towel between your skin and the bag.   Leave the ice on for 15 minutes at a time, every 2 hours, or as needed.   After you are improved and more active, it may  help to apply heat for 30 minutes before activities.  See your caregiver if you are having pain that lasts longer than expected. Your caregiver can advise appropriate exercises and/or therapy if needed. With conditioning, most back problems can be avoided. SEEK IMMEDIATE MEDICAL CARE IF:  You have numbness, tingling, weakness, or problems with the use of your arms or legs.   You experience severe back pain not relieved with medicines.   There is a change in bowel or bladder control.   You have increasing pain in any area of the body, including your belly (abdomen).   You notice shortness of breath, dizziness, or feel faint.   You feel sick to your stomach (nauseous), are throwing up (vomiting), or become sweaty.   You notice discoloration of your toes or legs, or your feet get very cold.   Your back pain is getting worse.   You have an oral temperature above 104, not controlled by medicine.  MAKE SURE YOU:   Understand these instructions.   Will watch your condition.   Will get help right away if you are not doing well or get worse.  Document Released: 03/29/2005 Document Re-Released: 09/13/2009   Start with moist heat and gentle stretching twice daily. Consider massage, chiropractic or PT if symptoms persist. Call with any concerns or if your respiratory symptoms worsen [facility] Patient Information 2011 [place], [place].

## 2011-02-06 NOTE — Assessment & Plan Note (Signed)
Resolved, ear looks clear today

## 2011-02-28 DIAGNOSIS — B009 Herpesviral infection, unspecified: Secondary | ICD-10-CM | POA: Insufficient documentation

## 2011-02-28 DIAGNOSIS — O24419 Gestational diabetes mellitus in pregnancy, unspecified control: Secondary | ICD-10-CM | POA: Insufficient documentation

## 2011-02-28 DIAGNOSIS — T7840XA Allergy, unspecified, initial encounter: Secondary | ICD-10-CM | POA: Insufficient documentation

## 2011-03-08 ENCOUNTER — Ambulatory Visit (INDEPENDENT_AMBULATORY_CARE_PROVIDER_SITE_OTHER): Payer: BC Managed Care – PPO | Admitting: Women's Health

## 2011-03-08 ENCOUNTER — Other Ambulatory Visit (HOSPITAL_COMMUNITY)
Admission: RE | Admit: 2011-03-08 | Discharge: 2011-03-08 | Disposition: A | Discharge status: Home / Self Care | Payer: BC Managed Care – PPO | Source: Ambulatory Visit | Attending: Obstetrics and Gynecology | Admitting: Obstetrics and Gynecology

## 2011-03-08 ENCOUNTER — Encounter: Payer: Self-pay | Admitting: Women's Health

## 2011-03-08 VITALS — BP 130/70 | Ht 63.5 in | Wt 126.0 lb

## 2011-03-08 DIAGNOSIS — Z01419 Encounter for gynecological examination (general) (routine) without abnormal findings: Secondary | ICD-10-CM | POA: Insufficient documentation

## 2011-03-08 DIAGNOSIS — F32A Depression, unspecified: Secondary | ICD-10-CM

## 2011-03-08 DIAGNOSIS — F329 Major depressive disorder, single episode, unspecified: Secondary | ICD-10-CM

## 2011-03-08 DIAGNOSIS — Z833 Family history of diabetes mellitus: Secondary | ICD-10-CM

## 2011-03-08 MED ORDER — ALPRAZOLAM 0.25 MG PO TABS: 0.2500 mg | ORAL_TABLET | Freq: Every evening | ORAL | Status: DC | PRN

## 2011-03-08 MED ORDER — SERTRALINE HCL 100 MG PO TABS: 100.0000 mg | ORAL_TABLET | Freq: Every day | ORAL | Status: DC

## 2011-03-08 NOTE — Progress Notes (Signed)
Karen Oconnell June 14, 1966 [id number]    History:    The patient presents for annual exam.  Director of a preschool program. This past year her 45 yo son Karen Oconnell has had some problems with depression with a suicide attempt. In counseling and on medication and is stable at this time.   Past medical history, past surgical history, family history and social history were all reviewed and documented in the EPIC chart.   ROS:  A  ROS was performed and pertinent positives and negatives are included in the history.  Exam:  Filed Vitals:   03/08/11 1441  BP: 130/70    General appearance:  Normal Head/Neck:  Normal, without cervical or supraclavicular adenopathy. Thyroid:  Symmetrical, normal in size, without palpable masses or nodularity. Respiratory  Effort:  Normal  Auscultation:  Clear without wheezing or rhonchi Cardiovascular  Auscultation:  Regular rate, without rubs, murmurs or gallops  Edema/varicosities:  Not grossly evident Abdominal  Soft,nontender, without masses, guarding or rebound.  Liver/spleen:  No organomegaly noted  Hernia:  None appreciated  Skin  Inspection:  Grossly normal  Palpation:  Grossly normal Neurologic/psychiatric  Orientation:  Normal with appropriate conversation.  Mood/affect:  Normal  Genitourinary    Breasts: Examined lying and sitting.     Right: Without masses, retractions, discharge or axillary adenopathy.     Left: Without masses, retractions, discharge or axillary adenopathy.   Inguinal/mons:  Normal without inguinal adenopathy  External genitalia:  Normal  BUS/Urethra/Skene's glands:  Normal  Bladder:  Normal  Vagina:  Normal  Cervix:  Normal  Uterus:   normal in size, shape and contour.  Midline and mobile  Adnexa/parametria:     Rt: Without masses or tenderness.   Lt: Without masses or tenderness.  Anus and perineum: Normal  Digital rectal exam: Normal sphincter tone without palpated masses or tenderness  Assessment/Plan:  45  y.o. MWF G2P2 for annual exam. Monthly 3-5 day cycle medium flow, had HER option ablation February of 2010 with good relief. Husband with vasectomy. Has had problems with anxiety and depression, currently on this Zoloft 50 mg, requested to have dose increased to 100 mg. She is currently in counseling due to family issues with her 3 yo son., no feelings of harming self.  Normal GYN exam, anxiety and depression.  Zoloft 100 mg by mouth daily prescription proper use was given will call if no relief, will continue with counseling. Uses an occasional Xanax 0.25 for rest. Prescription proper use was given reviewed, reviewed addictive properties, use  sparingly and keep it in safe place. Reviewed importance of sleep hygiene. SBEs, has had normal mammograms in the past, due in December of 2012. Continue exercise, calcium rich diet encouraged. Encourage leisure activities. CBC, glucose, UA and Pap she had a normal lipid profile and TSH in 2011.  Harrington Challenger [facility], 4:52 PM 03/08/2011

## 2011-06-20 ENCOUNTER — Encounter: Payer: Self-pay | Admitting: Family Medicine

## 2011-06-20 ENCOUNTER — Ambulatory Visit (INDEPENDENT_AMBULATORY_CARE_PROVIDER_SITE_OTHER): Payer: BC Managed Care – PPO | Admitting: Family Medicine

## 2011-06-20 DIAGNOSIS — R6889 Other general symptoms and signs: Secondary | ICD-10-CM

## 2011-06-20 DIAGNOSIS — J019 Acute sinusitis, unspecified: Secondary | ICD-10-CM

## 2011-06-20 DIAGNOSIS — J111 Influenza due to unidentified influenza virus with other respiratory manifestations: Secondary | ICD-10-CM

## 2011-06-20 MED ORDER — AMOXICILLIN-POT CLAVULANATE 875-125 MG PO TABS: 1.0000 | ORAL_TABLET | Freq: Two times a day (BID) | ORAL | Status: AC

## 2011-06-20 MED ORDER — HYDROCODONE-HOMATROPINE 5-1.5 MG/5ML PO SYRP: ORAL_SOLUTION | ORAL | Status: AC

## 2011-06-20 MED ORDER — OSELTAMIVIR PHOSPHATE 75 MG PO CAPS: ORAL_CAPSULE | ORAL | Status: DC

## 2011-06-20 NOTE — Progress Notes (Signed)
OFFICE NOTE  06/20/2011  CC:  Chief Complaint  Patient presents with  . URI     HPI: Patient is a 45 y.o. Caucasian female who is here for flu-like illness. Reports having sinusitis dx'd 06/05/11 and was rx'd amoxil and flonase.  Symptoms were improving but never completely resolved. About 6 days ago began getting worsening of nasal mucous/facial and head congestion feeling, sinus HA, and then started getting significant cough a couple of days later.  Then last night developed temp 100-101, diffuse muscle achiness, extreme fatigue. GI sx's: yes, a couple of loose BMs per day the last couple of days.  No n/v.  No abd pains.  Prominent myalgias: yes Prominent fatigue: yes. Flu vaccine received this season at least 2 weeks ago: no. 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  ROS:  no rash, no neck stiffness, no shortness of breath, no chest pain.  LMP 06/16/11 (regular menses)    Pertinent PMH:  Past Medical History  Diagnosis Date  . Depression   . Allergy     seasonal  . Chicken pox as a child  . Otitis externa 01/16/2011  . Allergic state 02/06/2011  . Neck pain, musculoskeletal 02/06/2011  . Anxiety   . Stress fracture 2008  . Gestational diabetes   . HSV infection   . Stress fracture 2008    left ankle   Past Surgical History  Procedure Date  . Cesarean section 1997    X 1  . Eye surgery age 34    right eye for strabismus  . Cyst removed from back of right leg 6-12  . Endometrial ablation 2010    HER OPTION ABLATION FOR MENORRHAGIA   Pertinent Meds: Ibuprofen 400mg  bid-tid prn, flonase qd, generic afrin prn, xanax qhs prn, zoloft 100mg  qd  PE: Blood pressure 107/79, pulse 115, temperature 100.2 F (37.9 C), temperature source Temporal, height 5' 3.25" (1.607 m), weight 122 lb (55.339 kg), SpO2 98.00%. VS: noted. Gen: alert, NAD, tired/sick but NONTOXIC APPEARING. HEENT: eyes with mild diffuse injection.  No drainage  or swelling.  Ears: EACs clear, TMs with normal light reflex and landmarks.  Nose: Clear rhinorrhea, with some dried, crusty exudate adherent to mildly injected mucosa.  No purulent d/c.  Diffuse paranasal and frontal sinus TTP.  No facial swelling.  Throat and mouth without focal lesion.  No pharyngial swelling, erythema, or exudate.   Neck: supple, no LAD.   LUNGS: CTA bilat, nonlabored resps.   CV: RRR, no m/r/g. EXT: no c/c/e SKIN: no rash  LABS: none today  IMPRESSION AND PLAN: 1) Flu-like illness, with recurrent sinusitis as well. Discussed daytime symptomatic care with fluids, nasal spray, ibuprofen, and mucinex DM.  Rx'd hycodan susp 1-2 tsp for nighttime cough use and after discussion of risks/benefits decided to start both tamiflu 75mg  bid x 5d and augmentin 875mg  bid x 7d.  Therapeutic expectations and side effect profile of medication discussed today.  Patient's questions answered.  Needs to return for flu vaccine when feeling well.  FOLLOW UP: prn

## 2011-07-04 HISTORY — PX: BREAST BIOPSY: SHX20

## 2011-09-06 ENCOUNTER — Encounter: Payer: Self-pay | Admitting: Family Medicine

## 2011-09-06 ENCOUNTER — Ambulatory Visit (INDEPENDENT_AMBULATORY_CARE_PROVIDER_SITE_OTHER): Payer: BC Managed Care – PPO | Admitting: Family Medicine

## 2011-09-06 VITALS — BP 118/85 | HR 73 | Temp 99.2°F | Ht 63.25 in | Wt 122.4 lb

## 2011-09-06 DIAGNOSIS — J329 Chronic sinusitis, unspecified: Secondary | ICD-10-CM

## 2011-09-06 MED ORDER — GUAIFENESIN ER 600 MG PO TB12: 600.0000 mg | ORAL_TABLET | Freq: Two times a day (BID) | ORAL | Status: AC

## 2011-09-06 MED ORDER — CETIRIZINE HCL 10 MG PO TABS: 10.0000 mg | ORAL_TABLET | Freq: Every day | ORAL | Status: DC

## 2011-09-06 MED ORDER — AMOXICILLIN-POT CLAVULANATE 875-125 MG PO TABS: 1.0000 | ORAL_TABLET | Freq: Two times a day (BID) | ORAL | Status: AC

## 2011-09-06 NOTE — Assessment & Plan Note (Signed)
Referred to ENT for further evaluation. Started on Augmentin XR 875 mg po bid, Mucinex bid, Cetirizine daily and push clear fluids

## 2011-09-06 NOTE — Assessment & Plan Note (Signed)
Patient using Flonase and a Netty pot routinely but not an Antihistamine, is encouraged to start some Cetirizine once to twice daily until seen by ENT

## 2011-09-06 NOTE — Progress Notes (Signed)
Patient ID: Karen Oconnell, female   DOB: 19-Aug-1965, 46 y.o.   MRN: [id number] KETURA SIREK [id number] March 10, 1966 09/06/2011      Progress Note-Follow Up  Subjective  Chief Complaint  Chief Complaint  Patient presents with  . Sinusitis    X 7 days- head congestion  . Cough     X 2 days w/ phlegm (green)  . Otalgia    left ear X 7 days  . Sore Throat    X 7 days    HPI  Patient is an 58 and 12 Caucasian female who is in today with a one-week history of worsening head congestion and discomfort. Symptoms she says are generally localized on the left and that is true then this time. She describes pain and pressure in the sinus above her eye but also behind her eyes and in her cheeks this time. She denies any fevers or chills but has had an increasing cough and nasal congestion productive of greenish phlegm over the last 2 days. She notes a mild sore throat. She's also had some conjunctivitis with yellow discharge from her eyes but denies photophobia or eye pain. She is using her Flonase daily. She did try some Mucinex DM and says it caused her agitation. She denies any chest pain, palpitations, shortness of breath, GI or GU complaints otherwise. She is frustrated about the recurrent nature of her sinus infections and is willing to accept referral today for further evaluation. She has been using Sudafed frequently to help manage her symptoms and does find that helps temporarily.  Past Medical History  Diagnosis Date  . Depression   . Allergy     seasonal  . Chicken pox as a child  . Otitis externa 01/16/2011  . Allergic state 02/06/2011  . Neck pain, musculoskeletal 02/06/2011  . Anxiety   . Stress fracture 2008  . Gestational diabetes   . HSV infection   . Stress fracture 2008    left ankle    Past Surgical History  Procedure Date  . Cesarean section 1997    X 1  . Eye surgery age 48    right eye for strabismus  . Cyst removed from back of right leg 6-12  . Endometrial  ablation 2010    HER OPTION ABLATION FOR MENORRHAGIA    Family History  Problem Relation Age of Onset  . Diabetes Mother     Type 2  . Hypertension Mother   . Hyperlipidemia Mother   . Depression Mother   . Seasonal affective disorder Father   . Cancer Father     MELANOMA  . Bipolar disorder Brother   . Drug abuse Brother     cocaine, vicodin, pain killers  . ADD / ADHD Son   . Depression Son   . Cancer Maternal Grandmother     gyn possibly cervical or uterine  . Diabetes Maternal Grandfather     Type 2  . Hypertension Maternal Grandfather   . Depression Maternal Grandfather   . Alcohol abuse Paternal Grandfather     History   Social History  . Marital Status: Married    Spouse Name: N/A    Number of Children: N/A  . Years of Education: N/A   Occupational History  . Not on file.   Social History Main Topics  . Smoking status: Never Smoker   . Smokeless tobacco: Never Used  . Alcohol Use: 0.0 oz/week    0 drink(s) per week  occasionaly  . Drug Use: No  . Sexually Active: Yes -- Female partner(s)    Birth Control/ Protection: Other-see comments     husband vasectomy   Other Topics Concern  . Not on file   Social History Narrative  . No narrative on file    Current Outpatient Prescriptions on File Prior to Visit  Medication Sig Dispense Refill  . ALPRAZolam (XANAX) 0.25 MG tablet Take 1 tablet (0.25 mg total) by mouth at bedtime as needed.  30 tablet  1  . fluticasone (FLONASE) 50 MCG/ACT nasal spray Place 2 sprays into both nostrils daily.        Marland Kitchen ibuprofen (ADVIL,MOTRIN) 200 MG tablet Take 600 mg by mouth every 6 (six) hours as needed.        Marland Kitchen oxymetazoline (AFRIN) 0.05 % nasal spray Place 2 sprays into the nose 2 (two) times daily.        . sertraline (ZOLOFT) 100 MG tablet Take 1 tablet (100 mg total) by mouth daily.  30 tablet  12    No Known Allergies  Review of Systems  Review of Systems  Constitutional: Negative for fever, chills and  malaise/fatigue.  HENT: Positive for ear pain, congestion and sore throat.   Eyes: Negative for discharge.  Respiratory: Positive for sputum production. Negative for shortness of breath.   Cardiovascular: Negative for chest pain, palpitations and leg swelling.  Gastrointestinal: Negative for nausea, abdominal pain and diarrhea.  Genitourinary: Negative for dysuria.  Musculoskeletal: Negative for falls.  Skin: Negative for rash.  Neurological: Positive for headaches. Negative for loss of consciousness.  Endo/Heme/Allergies: Negative for polydipsia.  Psychiatric/Behavioral: Negative for depression and suicidal ideas. The patient is not nervous/anxious and does not have insomnia.     Objective  BP 118/85  Pulse 73  Temp(Src) 99.2 F (37.3 C) (Temporal)  Ht 5' 3.25" (1.607 m)  Wt 122 lb 6.4 oz (55.52 kg)  BMI 21.51 kg/m2  SpO2 96%  LMP 09/03/2011  Physical Exam  Physical Exam  Constitutional: She is oriented to person, place, and time and well-developed, well-nourished, and in no distress. No distress.  HENT:  Head: Normocephalic and atraumatic.       Nasal mucosa is erythematous and boggy  Eyes: Conjunctivae are normal.  Neck: Neck supple. No thyromegaly present.  Cardiovascular: Normal rate, regular rhythm and normal heart sounds.   No murmur heard. Pulmonary/Chest: Effort normal and breath sounds normal. She has no wheezes.  Abdominal: She exhibits no distension and no mass.  Musculoskeletal: She exhibits no edema.  Lymphadenopathy:    She has no cervical adenopathy.  Neurological: She is alert and oriented to person, place, and time.  Skin: Skin is warm and dry. No rash noted. She is not diaphoretic.  Psychiatric: Memory, affect and judgment normal.    Lab Results  Component Value Date   TSH 0.54 03/02/2010   Lab Results  Component Value Date   WBC 7.0 03/02/2010   HGB 13.3 03/02/2010   HCT 40.5 03/02/2010   MCV 90.1 03/02/2010   PLT 329 03/02/2010       Assessment & Plan  SINUSITIS, RECURRENT Referred to ENT for further evaluation. Started on Augmentin XR 875 mg po bid, Mucinex bid, Cetirizine daily and push clear fluids  Allergy Patient using Flonase and a Netty pot routinely but not an Antihistamine, is encouraged to start some Cetirizine once to twice daily until seen by ENT

## 2011-09-06 NOTE — Patient Instructions (Signed)

## 2011-09-07 ENCOUNTER — Other Ambulatory Visit: Payer: Self-pay | Admitting: Women's Health

## 2011-09-07 DIAGNOSIS — Z1231 Encounter for screening mammogram for malignant neoplasm of breast: Secondary | ICD-10-CM

## 2011-10-10 ENCOUNTER — Other Ambulatory Visit: Payer: BC Managed Care – PPO

## 2011-10-10 ENCOUNTER — Ambulatory Visit (HOSPITAL_COMMUNITY)
Admission: RE | Admit: 2011-10-10 | Discharge: 2011-10-10 | Disposition: A | Discharge status: Home / Self Care | Payer: BC Managed Care – PPO | Source: Ambulatory Visit | Attending: Women's Health | Admitting: Women's Health

## 2011-10-10 ENCOUNTER — Other Ambulatory Visit: Payer: Self-pay | Admitting: Otolaryngology

## 2011-10-10 DIAGNOSIS — Z1231 Encounter for screening mammogram for malignant neoplasm of breast: Secondary | ICD-10-CM | POA: Insufficient documentation

## 2011-10-11 ENCOUNTER — Ambulatory Visit
Admission: RE | Admit: 2011-10-11 | Discharge: 2011-10-11 | Disposition: A | Discharge status: Home / Self Care | Payer: BC Managed Care – PPO | Source: Ambulatory Visit | Attending: Otolaryngology | Admitting: Otolaryngology

## 2012-03-21 ENCOUNTER — Other Ambulatory Visit: Payer: Self-pay | Admitting: Women's Health

## 2012-03-28 ENCOUNTER — Encounter: Payer: Self-pay | Admitting: Women's Health

## 2012-03-28 ENCOUNTER — Ambulatory Visit (INDEPENDENT_AMBULATORY_CARE_PROVIDER_SITE_OTHER): Payer: BC Managed Care – PPO | Admitting: Women's Health

## 2012-03-28 VITALS — BP 120/78 | Ht 64.0 in | Wt 123.0 lb

## 2012-03-28 DIAGNOSIS — Z13220 Encounter for screening for lipoid disorders: Secondary | ICD-10-CM

## 2012-03-28 DIAGNOSIS — F329 Major depressive disorder, single episode, unspecified: Secondary | ICD-10-CM

## 2012-03-28 DIAGNOSIS — Z833 Family history of diabetes mellitus: Secondary | ICD-10-CM

## 2012-03-28 DIAGNOSIS — Z01419 Encounter for gynecological examination (general) (routine) without abnormal findings: Secondary | ICD-10-CM

## 2012-03-28 DIAGNOSIS — F32A Depression, unspecified: Secondary | ICD-10-CM

## 2012-03-28 LAB — CBC WITH DIFFERENTIAL/PLATELET
Basophils Absolute: 0 10*3/uL (ref 0.0–0.1)
Basophils Relative: 0 % (ref 0–1)
Eosinophils Absolute: 0.1 10*3/uL (ref 0.0–0.7)
Eosinophils Relative: 1 % (ref 0–5)
HCT: 40.7 % (ref 36.0–46.0)
Hemoglobin: 13.8 g/dL (ref 12.0–15.0)
Lymphocytes Relative: 37 % (ref 12–46)
Lymphs Abs: 3.3 10*3/uL (ref 0.7–4.0)
MCH: 30 pg (ref 26.0–34.0)
MCHC: 33.9 g/dL (ref 30.0–36.0)
MCV: 88.5 fL (ref 78.0–100.0)
Monocytes Absolute: 0.5 10*3/uL (ref 0.1–1.0)
Monocytes Relative: 6 % (ref 3–12)
Neutro Abs: 5.1 10*3/uL (ref 1.7–7.7)
Neutrophils Relative %: 56 % (ref 43–77)
Platelets: 321 10*3/uL (ref 150–400)
RBC: 4.6 MIL/uL (ref 3.87–5.11)
RDW: 13.1 % (ref 11.5–15.5)
WBC: 9 10*3/uL (ref 4.0–10.5)

## 2012-03-28 LAB — LIPID PANEL
Cholesterol: 236 mg/dL — ABNORMAL HIGH (ref 0–200)
HDL: 86 mg/dL (ref >39)
LDL Cholesterol: 132 mg/dL — ABNORMAL HIGH (ref 0–99)
Total CHOL/HDL Ratio: 2.7 Ratio
Triglycerides: 89 mg/dL (ref <150)
VLDL: 18 mg/dL (ref 0–40)

## 2012-03-28 LAB — GLUCOSE, RANDOM: Glucose, Bld: 86 mg/dL (ref 70–99)

## 2012-03-28 MED ORDER — SERTRALINE HCL 100 MG PO TABS: 100.0000 mg | ORAL_TABLET | Freq: Every day | ORAL | Status: DC

## 2012-03-28 NOTE — Patient Instructions (Signed)

## 2012-03-28 NOTE — Progress Notes (Signed)
Karen Oconnell 05-06-1966 [id number]    History:    The patient presents for annual exam.  Light monthly cycle/HER option endometrial ablation in 2010/vasectomy. History of allergies and has had some problems with chronic sinusitis/negative MRI currently on Zyrtec. History of depression stable on Zoloft 100 daily. Has had counseling with Berniece Andreas and returns periodically. History of normal Paps and mammograms.  Past medical history, past surgical history, family history and social history were all reviewed and documented in the EPIC chart. 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 Daughter Allie 15,doing well. Both have had gardasil. Father history of melanoma has had negative yearly skin checks. History of GDM, mother history of adult onset diabetes.  ROS:  A  ROS was performed and pertinent positives and negatives are included in the history.  Exam:  Filed Vitals:   03/28/12 1354  BP: 120/78    General appearance:  Normal Head/Neck:  Normal, without cervical or supraclavicular adenopathy. Thyroid:  Symmetrical, normal in size, without palpable masses or nodularity. Respiratory  Effort:  Normal  Auscultation:  Clear without wheezing or rhonchi Cardiovascular  Auscultation:  Regular rate, without rubs, murmurs or gallops  Edema/varicosities:  Not grossly evident Abdominal  Soft,nontender, without masses, guarding or rebound.  Liver/spleen:  No organomegaly noted  Hernia:  None appreciated  Skin  Inspection:  Grossly normal  Palpation:  Grossly normal Neurologic/psychiatric  Orientation:  Normal with appropriate conversation.  Mood/affect:  Normal  Genitourinary    Breasts: Examined lying and sitting.     Right: Without masses, retractions, discharge or axillary adenopathy.     Left: Without masses, retractions, discharge or axillary adenopathy.   Inguinal/mons:  Normal  without inguinal adenopathy  External genitalia:  Normal  BUS/Urethra/Skene's glands:  Normal  Bladder:  Normal  Vagina:  Normal  Cervix:  Normal  Uterus:   normal in size, shape and contour.  Midline and mobile  Adnexa/parametria:     Rt: Without masses or tenderness.   Lt: Without masses or tenderness.  Anus and perineum: Normal  Digital rectal exam: Normal sphincter tone without palpated masses or tenderness  Assessment/Plan:  45 y.o. MWF G2 P2 for annual exam with no complaints.  Chronic sinusitis Normal GYN exam Depression stable on Zoloft 100  Plan: CBC, glucose, lipid panel, UA. No Pap history of normal Paps new screening guidelines reviewed. Zoloft 100 by mouth daily prescription, proper use, given and reviewed, counseling as needed encouraged. SBE's, continue annual mammogram, calcium rich diet, vitamin D 1000 daily, exercise encouraged. Continue annual skin checks/father history of melanoma.     Harrington Challenger WHNP, 2:26 PM 03/28/2012

## 2012-03-29 LAB — URINALYSIS W MICROSCOPIC + REFLEX CULTURE
Bacteria, UA: NONE SEEN
Bilirubin Urine: NEGATIVE
Casts: NONE SEEN
Crystals: NONE SEEN
Glucose, UA: NEGATIVE mg/dL
Hgb urine dipstick: NEGATIVE
Ketones, ur: NEGATIVE mg/dL
Leukocytes, UA: NEGATIVE
Nitrite: NEGATIVE
Protein, ur: NEGATIVE mg/dL
Specific Gravity, Urine: 1.014 (ref 1.005–1.030)
Squamous Epithelial / LPF: NONE SEEN
Urobilinogen, UA: 0.2 mg/dL (ref 0.0–1.0)
pH: 6 (ref 5.0–8.0)

## 2012-04-01 ENCOUNTER — Encounter: Payer: Self-pay | Admitting: Gynecology

## 2012-04-03 ENCOUNTER — Other Ambulatory Visit: Payer: Self-pay | Admitting: *Deleted

## 2012-04-03 DIAGNOSIS — F32A Depression, unspecified: Secondary | ICD-10-CM

## 2012-04-03 DIAGNOSIS — F329 Major depressive disorder, single episode, unspecified: Secondary | ICD-10-CM

## 2012-04-03 MED ORDER — ALPRAZOLAM 0.25 MG PO TABS: 0.2500 mg | ORAL_TABLET | Freq: Every evening | ORAL | Status: DC | PRN

## 2012-04-03 NOTE — Telephone Encounter (Signed)
Yes please call in for pt #[phone] with no refills

## 2012-04-03 NOTE — Telephone Encounter (Signed)
Pt calling requesting refill on xanax 0.25 mg tablets. Okay to fill?

## 2012-04-04 NOTE — Telephone Encounter (Signed)
rx called into [facility] 

## 2012-04-05 ENCOUNTER — Encounter: Payer: Self-pay | Admitting: Women's Health

## 2012-04-05 ENCOUNTER — Other Ambulatory Visit: Payer: Self-pay | Admitting: Women's Health

## 2012-04-11 ENCOUNTER — Ambulatory Visit (INDEPENDENT_AMBULATORY_CARE_PROVIDER_SITE_OTHER): Payer: BC Managed Care – PPO | Admitting: Gynecology

## 2012-04-11 ENCOUNTER — Other Ambulatory Visit: Payer: Self-pay | Admitting: Gynecology

## 2012-04-11 ENCOUNTER — Encounter: Payer: Self-pay | Admitting: Gynecology

## 2012-04-11 ENCOUNTER — Telehealth: Payer: Self-pay | Admitting: *Deleted

## 2012-04-11 VITALS — BP 108/68

## 2012-04-11 DIAGNOSIS — N6310 Unspecified lump in the right breast, unspecified quadrant: Secondary | ICD-10-CM | POA: Insufficient documentation

## 2012-04-11 DIAGNOSIS — Z23 Encounter for immunization: Secondary | ICD-10-CM

## 2012-04-11 DIAGNOSIS — N644 Mastodynia: Secondary | ICD-10-CM | POA: Insufficient documentation

## 2012-04-11 DIAGNOSIS — N63 Unspecified lump in unspecified breast: Secondary | ICD-10-CM

## 2012-04-11 NOTE — Telephone Encounter (Signed)
Message copied by Aura Camps on Thu Apr 11, 2012 11:46 AM ------      Message from: Ok Edwards      Created: Thu Apr 11, 2012 11:39 AM       Please schedule a diagnostic mammogram at breast center next week. Right breast mass ant 11 o'clock position 2 fingerbreadths from areolar region. All c/o axillary (r) tenderness.

## 2012-04-11 NOTE — Patient Instructions (Addendum)
You can purchase vitamin E 600 units. Take one daily and cut down on caffeine to help with tender breast.  Breast Tenderness Breast tenderness is a common complaint made by women of all ages. It is also called mastalgia or mastodynia, which means breast pain. The condition can range from mild discomfort to severe pain. It has a variety of causes. Your caregiver will find out the likely cause of your breast tenderness by examining your breasts, asking you about symptoms and perhaps ordering some tests. Breast tenderness usually does not mean you have breast cancer. CAUSES  Breast tenderness has many possible causes. They include:  Premenstrual changes. A week to 10 days before your period, your breasts might ache or feel tender.  Other hormonal causes. These include:  When sexual and physical traits mature (puberty).  Pregnancy.  The time right before and the year after menopause (perimenopause).  The day when it has been 12 months since your last period (menopause).  Large breasts.  Infection (also called mastitis).  Birth control pills.  Breastfeeding. Tenderness can occur if the breasts are overfull with milk or if a milk duct is blocked.  Injury.  Fibrocystic breast changes. This is not cancer (benign). It causes painful breasts that feel lumpy.  Fluid-filled sacs (cysts). Often cysts can be drained in your healthcare provider's office.  Fibroadenoma. This is a tumor that is not cancerous.  Medication side effects. Blood pressure drugs and diuretics (which increase urine flow) sometimes cause breast tenderness.  Previous breast surgery, such as a breast reduction.  Breast cancer. Cancer is rarely the reason breasts are tender. In most women, tenderness is caused by something else. DIAGNOSIS  Several methods can be used to find out why your breasts are tender. They include:  Visual inspection of the breasts.  Examination by hand.  Tests, such  as:  Mammogram.  Ultrasound.  Biopsy.  Lab test of any fluid coming from the nipple.  Blood tests.  MRI. TREATMENT  Treatment is directed to the cause of the breast tenderness from doing nothing for minor discomfort, wearing a good support bra but also may include:  Taking over-the-counter medicines for pain or discomfort as directed by your caregiver.  Prescription medicine for breast tenderness related to:  Premenstrual.  Fibrocystic.  Puberty.  Pregnancy.  Menopause.  Previous breast surgery.  Large breasts.  Antibiotics for infection.  Birth control pills for fibrocystic and premenstrual changes.  More frequent feedings or pumping of the breasts and warm compresses for breast engorgement when nursing.  Cold and warm compresses and a good support bra for most breast injuries.  Breast cysts are sometimes drained with a needle (aspiration) or removed with minor surgery.  Fibroadenomas are usually removed with minor surgery.  Changing or stopping the medicine when it is responsible for causing the breast tenderness.  When breast cancer is present with or without causing pain, it is usually treated with major surgery (with or without radiation) and chemotherapy. HOME CARE INSTRUCTIONS  Breast tenderness often can be handled at home. You can try:  Getting fitted for a new bra that provides more support, especially during exercise.  Wearing a more supportive or sports bra while sleeping when your breasts are very tender.  If you have a breast injury, using an ice pack for 15 to 20 minutes. Wrap the pack in a towel. Do not put the ice pack directly on your breast.  If your breasts are too full of milk as a result of breastfeeding,  try:  Expressing milk either by hand or with a breast pump.  Applying a warm compress for relief.  Taking over-the-counter pain relievers, if this is OK with your caregiver.  Taking medicine that your caregiver prescribes. These  might include antibiotics or birth control pills. Over the long term, your breast tenderness might be eased if you:  Cut down on caffeine.  Reduce the amount of fat in your diet. Also, learn how to do breast examinations at home. This will help you tell when you have an unusual growth or lump that could cause tenderness. And keep a log of the days and times when your breasts are most tender. This will help you and your caregiver find the right solution. SEEK MEDICAL CARE IF:   Any part of your breast is hard, red and hot to the touch. This could be a sign of infection.  Fluid is coming out of your nipples (and you are not breastfeeding). Especially watch for blood or pus.  You have a fever as well as breast tenderness.  You have a new or painful lump in your breast that remains after your period ends.  You have tried to take care of the pain at home, but it has not gone away.  Your breast pain is getting worse. Or, the pain is making it hard to do the things you usually do during your day. Document Released: 06/01/2008 Document Revised: 09/11/2011 Document Reviewed: 06/01/2008 [facility] Patient Information 2013 [place], [place].

## 2012-04-11 NOTE — Telephone Encounter (Signed)
Order place for diag. Mammo.

## 2012-04-11 NOTE — Progress Notes (Signed)
Patient is a 46 year old who presented to the office stating that 3 days ago she noticed tenderness on her upper outer quadrant of the right breast and she specifically felt a tender area and thought she felt a lump there as well. Patient had a normal mammogram in April of this year. Patient denies any family history of breast cancer. Patient denies any recent trauma or nipple discharge.  Exam: The breasts were examined in sitting and supine position her left breast is slightly larger than the right from childbirth. There was no nipple retraction no skin discoloration there was no supraclavicular or axillary lymphadenopathy left breast no palpable masses or tenderness right breast at the 11:00 position 2 fingerbreadths from the areolar region was an irregular ridge area a centimeter and a half in size and tender on palpation as was the right tail of Spence.  Assessment/plan: Patient with mastodynia currently on her menses who noted a tender raised area on her right breast at the 11:00 position. A true mass not felt a slightly irregular ridge 1-1/2 cm in size and tender along her tail of Spence as well. She will be sent for diagnostic mammogram of this area. I will encourage her also to cut down or caffeine-containing products and begin taking vitamin D 600 units daily. She wanted to receive the flu vaccine and received after she was counseled.

## 2012-04-15 NOTE — Telephone Encounter (Signed)
Appointment 04/17/12 @ 10;20 am

## 2012-04-17 ENCOUNTER — Ambulatory Visit
Admission: RE | Admit: 2012-04-17 | Discharge: 2012-04-17 | Disposition: A | Discharge status: Home / Self Care | Payer: BC Managed Care – PPO | Source: Ambulatory Visit | Attending: Gynecology | Admitting: Gynecology

## 2012-04-17 ENCOUNTER — Other Ambulatory Visit: Payer: Self-pay | Admitting: Gynecology

## 2012-04-17 DIAGNOSIS — N6310 Unspecified lump in the right breast, unspecified quadrant: Secondary | ICD-10-CM

## 2012-04-24 ENCOUNTER — Ambulatory Visit
Admission: RE | Admit: 2012-04-24 | Discharge: 2012-04-24 | Disposition: A | Discharge status: Home / Self Care | Payer: BC Managed Care – PPO | Source: Ambulatory Visit | Attending: Gynecology | Admitting: Gynecology

## 2012-04-24 ENCOUNTER — Other Ambulatory Visit: Payer: Self-pay | Admitting: Gynecology

## 2012-04-24 ENCOUNTER — Other Ambulatory Visit: Payer: BC Managed Care – PPO

## 2012-04-24 DIAGNOSIS — N6310 Unspecified lump in the right breast, unspecified quadrant: Secondary | ICD-10-CM

## 2012-04-26 ENCOUNTER — Other Ambulatory Visit: Payer: BC Managed Care – PPO

## 2012-07-09 ENCOUNTER — Ambulatory Visit (INDEPENDENT_AMBULATORY_CARE_PROVIDER_SITE_OTHER): Payer: BC Managed Care – PPO | Admitting: Gynecology

## 2012-07-09 ENCOUNTER — Encounter: Payer: Self-pay | Admitting: Gynecology

## 2012-07-09 DIAGNOSIS — N390 Urinary tract infection, site not specified: Secondary | ICD-10-CM

## 2012-07-09 DIAGNOSIS — R300 Dysuria: Secondary | ICD-10-CM

## 2012-07-09 DIAGNOSIS — N898 Other specified noninflammatory disorders of vagina: Secondary | ICD-10-CM

## 2012-07-09 DIAGNOSIS — L293 Anogenital pruritus, unspecified: Secondary | ICD-10-CM

## 2012-07-09 LAB — WET PREP FOR TRICH, YEAST, CLUE
Clue Cells Wet Prep HPF POC: NONE SEEN
Trich, Wet Prep: NONE SEEN
Yeast Wet Prep HPF POC: NONE SEEN

## 2012-07-09 LAB — URINALYSIS W MICROSCOPIC + REFLEX CULTURE
Bilirubin Urine: NEGATIVE
Casts: NONE SEEN
Crystals: NONE SEEN
Glucose, UA: NEGATIVE mg/dL
Nitrite: POSITIVE — AB
Protein, ur: 30 mg/dL — AB
Specific Gravity, Urine: 1.025 (ref 1.005–1.030)
Urobilinogen, UA: 0.2 mg/dL (ref 0.0–1.0)
pH: 6.5 (ref 5.0–8.0)

## 2012-07-09 MED ORDER — NITROFURANTOIN MONOHYD MACRO 100 MG PO CAPS: 100.0000 mg | ORAL_CAPSULE | Freq: Two times a day (BID) | ORAL | Status: DC

## 2012-07-09 NOTE — Progress Notes (Signed)
Patient is a 47 year old who presented to the office today complaining of some frequency and dysuria or the past few days. Patient was also having some suprapubic tenderness. Patient any fever chills nausea or vomiting and very little back discomfort. Last week she was treated yeast infection with Diflucan and has some mild vaginal discomfort in one to make sure the infection that cleared. She is in a monogamous relationship. Her partner has had a vasectomy. Her menstrual cycles are regular.  Exam: Back: No CVA tenderness Abdomen: Soft nontender no rebound or guarding some slight suprapubic discomfort Pelvic: Bartholin urethra Skene was within normal limits Vagina: No lesions or discharge Cervix: No lesions or discharge  Wet prep negative  Urinalysis: White blood cells 21-50, red blood cell 7-10, bacteria many  Assessment/plan: Urinary tract infection. Patient will be treated with Macrobid one by mouth twice a day for 7 days. As an anti-spasmodic agent she will be given sample of Uribell to take 1 by mouth 4 times a day for 2 days. She was encouraged to increase her fluid intake. If she would develop fever chills with a without back pain she should report to the office immediately or to the emergency room after hours.

## 2012-07-09 NOTE — Patient Instructions (Addendum)
Asymptomatic Bacteriuria, Female  Your urine study shows bacteria in your urine. You do not have the usual symptoms of burning or frequent urination. This is why it is called asymptomatic. You may need treatment with antibiotics. Treatment is especially important if you are pregnant. Sometimes this condition can progress to a more severe bladder or kidney infection. Symptoms include burning when urinating, back pain, fever, nausea, or vomiting.  Take your antibiotics as directed. Finish them even if you start to feel better. Drink enough water and fluids to keep your urine clear or pale yellow. Go to the bathroom more frequently to keep your bladder empty. Keep the area around the vagina and rectum clean. Wipe yourself from front to back after urinating. Call your caregiver to arrange for follow-up care.   SEEK IMMEDIATE MEDICAL CARE IF:   You develop repeated vomiting.   You develop severe back or abdominal pain.   You have abnormal vaginal discharge or bleeding.   You have blood in the urine.   You develop cramping or abdominal pain.   You have a fever.  If you are pregnant and develop any of the above problems see your caregiver or seek care immediately.  Document Released: 06/19/2005 Document Revised: 09/11/2011 Document Reviewed: 05/05/2009  [employer] Patient Information 2013 [employer].

## 2012-07-12 LAB — URINE CULTURE: Colony Count: >=100000

## 2012-08-23 ENCOUNTER — Other Ambulatory Visit: Payer: Self-pay | Admitting: Women's Health

## 2012-08-23 NOTE — Telephone Encounter (Signed)
rx called into pharmacy

## 2012-10-10 ENCOUNTER — Ambulatory Visit (INDEPENDENT_AMBULATORY_CARE_PROVIDER_SITE_OTHER): Payer: BC Managed Care – PPO | Admitting: Women's Health

## 2012-10-10 DIAGNOSIS — N760 Acute vaginitis: Secondary | ICD-10-CM

## 2012-10-10 DIAGNOSIS — B9689 Other specified bacterial agents as the cause of diseases classified elsewhere: Secondary | ICD-10-CM

## 2012-10-10 DIAGNOSIS — A499 Bacterial infection, unspecified: Secondary | ICD-10-CM

## 2012-10-10 LAB — URINALYSIS W MICROSCOPIC + REFLEX CULTURE
Bilirubin Urine: NEGATIVE
Glucose, UA: NEGATIVE mg/dL
Hgb urine dipstick: NEGATIVE
Ketones, ur: NEGATIVE mg/dL
Leukocytes, UA: NEGATIVE
Nitrite: NEGATIVE
Protein, ur: NEGATIVE mg/dL
Specific Gravity, Urine: 1.025 (ref 1.005–1.030)
Urobilinogen, UA: 0.2 mg/dL (ref 0.0–1.0)
pH: 5.5 (ref 5.0–8.0)

## 2012-10-10 LAB — WET PREP FOR TRICH, YEAST, CLUE
Trich, Wet Prep: NONE SEEN
Yeast Wet Prep HPF POC: NONE SEEN

## 2012-10-10 MED ORDER — METRONIDAZOLE 0.75 % VA GEL: VAGINAL | Status: DC

## 2012-10-10 NOTE — Patient Instructions (Addendum)
Bacterial Vaginosis Bacterial vaginosis (BV) is a vaginal infection where the normal balance of bacteria in the vagina is disrupted. The normal balance is then replaced by an overgrowth of certain bacteria. There are several different kinds of bacteria that can cause BV. BV is the most common vaginal infection in women of childbearing age. CAUSES   The cause of BV is not fully understood. BV develops when there is an increase or imbalance of harmful bacteria.  Some activities or behaviors can upset the normal balance of bacteria in the vagina and put women at increased risk including:  Having a new sex partner or multiple sex partners.  Douching.  Using an intrauterine device (IUD) for contraception.  It is not clear what role sexual activity plays in the development of BV. However, women that have never had sexual intercourse are rarely infected with BV. Women do not get BV from toilet seats, bedding, swimming pools or from touching objects around them.  SYMPTOMS   Grey vaginal discharge.  A fish-like odor with discharge, especially after sexual intercourse.  Itching or burning of the vagina and vulva.  Burning or pain with urination.  Some women have no signs or symptoms at all. DIAGNOSIS  Your caregiver must examine the vagina for signs of BV. Your caregiver will perform lab tests and look at the sample of vaginal fluid through a microscope. They will look for bacteria and abnormal cells (clue cells), a pH test higher than 4.5, and a positive amine test all associated with BV.  RISKS AND COMPLICATIONS   Pelvic inflammatory disease (PID).  Infections following gynecology surgery.  Developing HIV.  Developing herpes virus. TREATMENT  Sometimes BV will clear up without treatment. However, all women with symptoms of BV should be treated to avoid complications, especially if gynecology surgery is planned. Female partners generally do not need to be treated. However, BV may spread  between female sex partners so treatment is helpful in preventing a recurrence of BV.   BV may be treated with antibiotics. The antibiotics come in either pill or vaginal cream forms. Either can be used with nonpregnant or pregnant women, but the recommended dosages differ. These antibiotics are not harmful to the baby.  BV can recur after treatment. If this happens, a second round of antibiotics will often be prescribed.  Treatment is important for pregnant women. If not treated, BV can cause a premature delivery, especially for a pregnant woman who had a premature birth in the past. All pregnant women who have symptoms of BV should be checked and treated.  For chronic reoccurrence of BV, treatment with a type of prescribed gel vaginally twice a week is helpful. HOME CARE INSTRUCTIONS   Finish all medication as directed by your caregiver.  Do not have sex until treatment is completed.  Tell your sexual partner that you have a vaginal infection. They should see their caregiver and be treated if they have problems, such as a mild rash or itching.  Practice safe sex. Use condoms. Only have 1 sex partner. PREVENTION  Basic prevention steps can help reduce the risk of upsetting the natural balance of bacteria in the vagina and developing BV:  Do not have sexual intercourse (be abstinent).  Do not douche.  Use all of the medicine prescribed for treatment of BV, even if the signs and symptoms go away.  Tell your sex partner if you have BV. That way, they can be treated, if needed, to prevent reoccurrence. SEEK MEDICAL CARE IF:     Your symptoms are not improving after 3 days of treatment.  You have increased discharge, pain, or fever. MAKE SURE YOU:   Understand these instructions.  Will watch your condition.  Will get help right away if you are not doing well or get worse. FOR MORE INFORMATION  Division of STD Prevention (DSTDP), Centers for Disease Control and Prevention:  www.cdc.gov/std American Social Health Association (ASHA): www.ashastd.org  Document Released: 06/19/2005 Document Revised: 09/11/2011 Document Reviewed: 12/10/2008 [employer] Patient Information 2013 [employer].  

## 2012-10-10 NOTE — Addendum Note (Signed)
Addended by: Harrington Challenger on: 10/10/2012 12:48 PM   Modules accepted: Orders

## 2012-10-10 NOTE — Progress Notes (Signed)
Patient ID: Karen Oconnell, female   DOB: 02/16/1966, 47 y.o.   MRN: [id number] Presents with the complaint of vaginal discharge with questionable odor and some burning with urination. Questions if she has a tear near her clitoris. Denies a fever or abdominal pain. Regular monthly cycle.  Exam: Appears well, external genitalia slightly erythematous, and no visible tear or irritation, ulceration noted. Speculum exam moderate amount of a white discharge noted wet prep positive for clues and TNTC bacteria. Bimanual no CMT or adnexal fullness or tenderness.UA negative.  Bacteria vaginosis  Plan: MetroGel vaginal cream 1 applicator at bedtime x5, alcohol precautions reviewed. Instructed to call if no relief of symptoms.

## 2013-01-20 ENCOUNTER — Other Ambulatory Visit: Payer: Self-pay | Admitting: *Deleted

## 2013-01-20 ENCOUNTER — Other Ambulatory Visit: Payer: BC Managed Care – PPO

## 2013-01-20 DIAGNOSIS — N390 Urinary tract infection, site not specified: Secondary | ICD-10-CM

## 2013-01-21 LAB — URINALYSIS
Bilirubin Urine: NEGATIVE
Glucose, UA: NEGATIVE mg/dL
Hgb urine dipstick: NEGATIVE
Ketones, ur: NEGATIVE mg/dL
Leukocytes, UA: NEGATIVE
Nitrite: NEGATIVE
Protein, ur: NEGATIVE mg/dL
Specific Gravity, Urine: 1.01 (ref 1.005–1.030)
Urobilinogen, UA: 0.2 mg/dL (ref 0.0–1.0)
pH: 7.5 (ref 5.0–8.0)

## 2013-02-08 ENCOUNTER — Other Ambulatory Visit: Payer: Self-pay | Admitting: Women's Health

## 2013-04-30 ENCOUNTER — Other Ambulatory Visit: Payer: Self-pay | Admitting: Women's Health

## 2013-04-30 DIAGNOSIS — Z1231 Encounter for screening mammogram for malignant neoplasm of breast: Secondary | ICD-10-CM

## 2013-05-08 ENCOUNTER — Other Ambulatory Visit: Payer: Self-pay

## 2013-05-15 ENCOUNTER — Other Ambulatory Visit (HOSPITAL_COMMUNITY)
Admission: RE | Admit: 2013-05-15 | Discharge: 2013-05-15 | Disposition: A | Discharge status: Home / Self Care | Payer: BC Managed Care – PPO | Source: Ambulatory Visit | Attending: Gynecology | Admitting: Gynecology

## 2013-05-15 ENCOUNTER — Ambulatory Visit (HOSPITAL_COMMUNITY)
Admission: RE | Admit: 2013-05-15 | Discharge: 2013-05-15 | Disposition: A | Discharge status: Home / Self Care | Payer: BC Managed Care – PPO | Source: Ambulatory Visit | Attending: Women's Health | Admitting: Women's Health

## 2013-05-15 ENCOUNTER — Encounter: Payer: Self-pay | Admitting: Women's Health

## 2013-05-15 ENCOUNTER — Ambulatory Visit (INDEPENDENT_AMBULATORY_CARE_PROVIDER_SITE_OTHER): Payer: BC Managed Care – PPO | Admitting: Women's Health

## 2013-05-15 VITALS — BP 110/60 | Ht 63.0 in | Wt 128.8 lb

## 2013-05-15 DIAGNOSIS — F411 Generalized anxiety disorder: Secondary | ICD-10-CM

## 2013-05-15 DIAGNOSIS — E079 Disorder of thyroid, unspecified: Secondary | ICD-10-CM

## 2013-05-15 DIAGNOSIS — Z01419 Encounter for gynecological examination (general) (routine) without abnormal findings: Secondary | ICD-10-CM | POA: Insufficient documentation

## 2013-05-15 DIAGNOSIS — Z833 Family history of diabetes mellitus: Secondary | ICD-10-CM

## 2013-05-15 DIAGNOSIS — Z1231 Encounter for screening mammogram for malignant neoplasm of breast: Secondary | ICD-10-CM | POA: Insufficient documentation

## 2013-05-15 LAB — CBC WITH DIFFERENTIAL/PLATELET
Basophils Absolute: 0 10*3/uL (ref 0.0–0.1)
Basophils Relative: 0 % (ref 0–1)
Eosinophils Absolute: 0.1 10*3/uL (ref 0.0–0.7)
Eosinophils Relative: 1 % (ref 0–5)
HCT: 38.5 % (ref 36.0–46.0)
Hemoglobin: 13 g/dL (ref 12.0–15.0)
Lymphocytes Relative: 30 % (ref 12–46)
Lymphs Abs: 2.9 10*3/uL (ref 0.7–4.0)
MCH: 29.9 pg (ref 26.0–34.0)
MCHC: 33.8 g/dL (ref 30.0–36.0)
MCV: 88.5 fL (ref 78.0–100.0)
Monocytes Absolute: 0.6 10*3/uL (ref 0.1–1.0)
Monocytes Relative: 6 % (ref 3–12)
Neutro Abs: 6 10*3/uL (ref 1.7–7.7)
Neutrophils Relative %: 63 % (ref 43–77)
Platelets: 365 10*3/uL (ref 150–400)
RBC: 4.35 MIL/uL (ref 3.87–5.11)
RDW: 13.8 % (ref 11.5–15.5)
WBC: 9.6 10*3/uL (ref 4.0–10.5)

## 2013-05-15 LAB — GLUCOSE, RANDOM: Glucose, Bld: 78 mg/dL (ref 70–99)

## 2013-05-15 MED ORDER — SERTRALINE HCL 100 MG PO TABS: ORAL_TABLET | ORAL | Status: DC

## 2013-05-15 MED ORDER — ALPRAZOLAM 0.25 MG PO TABS: ORAL_TABLET | ORAL | Status: DC

## 2013-05-15 NOTE — Patient Instructions (Signed)

## 2013-05-15 NOTE — Progress Notes (Signed)
Karen Oconnell 1966-05-17 [id number]    History:    The patient presents for annual exam.  Monthly cycles/vasectomy. Her option Ablation 08/2008. Reports only one heavy day of cycle, manageable. Normal Pap and mammogram history. Had a benign breast biopsy 04/2012. Anxiety and depression on Zoloft 100 requests higher dose. States  has helped, also in counseling.  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 [age] year old depression issues, history of a suicide attempt, Allie 16 doing well. Father melanoma. Mother diabetes. Gestational diabetes.  ROS:  A  ROS was performed and pertinent positives and negatives are included in the history.  Exam:  Filed Vitals:   05/15/13 1514  BP: 110/60    General appearance:  Normal Head/Neck:  Normal, without cervical or supraclavicular adenopathy. Thyroid:  Symmetrical, normal in size, without palpable masses or nodularity. Respiratory  Effort:  Normal  Auscultation:  Clear without wheezing or rhonchi Cardiovascular  Auscultation:  Regular rate, without rubs, murmurs or gallops  Edema/varicosities:  Not grossly evident Abdominal  Soft,nontender, without masses, guarding or rebound.  Liver/spleen:  No organomegaly noted  Hernia:  None appreciated  Skin  Inspection:  Grossly normal  Palpation:  Grossly normal Neurologic/psychiatric  Orientation:  Normal with appropriate conversation.  Mood/affect:  Normal  Genitourinary    Breasts: Examined lying and sitting.     Right: Without masses, retractions, discharge or axillary adenopathy.     Left: Without masses, retractions, discharge or axillary adenopathy.   Inguinal/mons:  Normal without inguinal adenopathy  External genitalia:  Normal  BUS/Urethra/Skene's glands:  Normal  Bladder:  Normal  Vagina:  Normal  Cervix:  Normal  Uterus:   normal in size, shape and contour.  Midline and  mobile  Adnexa/parametria:     Rt: Without masses or tenderness.   Lt: Without masses or tenderness.  Anus and perineum: Normal  Digital rectal exam: Normal sphincter tone without palpated masses or tenderness  Assessment/Plan:  47 y.o. MWF G2P2 for annual exam.    Monthly cycles/vasectomy/ablation Anxiety/depression-Zoloft  Plan: SBE's, continue annual mammogram, 3-D tomography reviewed and encouraged dense breast. Will try Zoloft 150 prescription, proper use given and reviewed. Continue counseling. Uses rare Xanax prescription, proper use given and reviewed, aware of addictive properties. Regular exercise, calcium rich diet, vitamin D 2000 daily encouraged. CBC, glucose, TSH, UA, Pap. Pap normal 2012. Instructed to come fasting next year for lipid panel.   Harrington Challenger [facility], 4:37 PM 05/15/2013

## 2013-05-16 LAB — URINALYSIS W MICROSCOPIC + REFLEX CULTURE
Bacteria, UA: NONE SEEN
Bilirubin Urine: NEGATIVE
Casts: NONE SEEN
Glucose, UA: NEGATIVE mg/dL
Hgb urine dipstick: NEGATIVE
Ketones, ur: NEGATIVE mg/dL
Leukocytes, UA: NEGATIVE
Nitrite: NEGATIVE
Protein, ur: NEGATIVE mg/dL
Specific Gravity, Urine: 1.029 (ref 1.005–1.030)
Squamous Epithelial / LPF: NONE SEEN
Urobilinogen, UA: 0.2 mg/dL (ref 0.0–1.0)
pH: 6 (ref 5.0–8.0)

## 2013-05-16 LAB — TSH: TSH: 0.783 u[IU]/mL (ref 0.350–4.500)

## 2013-07-17 ENCOUNTER — Ambulatory Visit (INDEPENDENT_AMBULATORY_CARE_PROVIDER_SITE_OTHER): Payer: BC Managed Care – PPO | Admitting: Podiatry

## 2013-07-17 ENCOUNTER — Ambulatory Visit (INDEPENDENT_AMBULATORY_CARE_PROVIDER_SITE_OTHER): Payer: BC Managed Care – PPO

## 2013-07-17 ENCOUNTER — Ambulatory Visit: Payer: BC Managed Care – PPO | Admitting: Podiatry

## 2013-07-17 ENCOUNTER — Encounter: Payer: Self-pay | Admitting: Podiatry

## 2013-07-17 VITALS — BP 116/71 | HR 92 | Resp 16 | Ht 63.0 in | Wt 128.0 lb

## 2013-07-17 DIAGNOSIS — M722 Plantar fascial fibromatosis: Secondary | ICD-10-CM

## 2013-07-17 DIAGNOSIS — M2020 Hallux rigidus, unspecified foot: Secondary | ICD-10-CM

## 2013-07-17 NOTE — Progress Notes (Signed)
   Subjective:    Patient ID: Karen Oconnell, female    DOB: 08/08/1965, 48 y.o.   MRN: [id number]  HPI Comments: "I have pain in this heel"  Patient states that she has had burning on the lateral side of right heel for about 4-5 months. She notices its worse in the morning and after sitting for long periods of time. Walking and exercises like the treadmill aggravates it. She has tried "good" shoes, wears orthotics, avoids high heels, tried yoga and Aleve with no relief.  She is also concerned with the arthritis in the 1st MPJ right foot.  She states that the combination of heel pain and the pain around her right great toe joint reduce the amount of her elective physical activity especially the treadmill. She wears her existing orthotic and or athletic shoes on a daily basis.   Review of Systems  Constitutional: Positive for activity change.       Sweating   HENT: Positive for sore throat.   Musculoskeletal: Positive for arthralgias, back pain and gait problem.  Allergic/Immunologic: Positive for environmental allergies.  Hematological: Bruises/bleeds easily.  All other systems reviewed and are negative.       Objective:   Physical Exam  Orientated x70 48 year old white female  Vascular: The DP and PT pulses are two over four bilaterally.  Neurological: Knee and ankle reflexes equal and reactive bilaterally  Dermatological: Texture and turgor within normal limits  Musculoskeletal: Range of motion of the first MPJ or approximately 30-40 of dorsiflexion bilaterally without any crepitus. The right first MPJ is tender on range of motion and to palpation. The medial plantar right heel and mid fascial band on the right foot are tender to palpation without a palpable lesions.   X-ray report weightbearing right foot  Posterior and inferior heel spurs present. Small dorsal spurring noted on the head of the first metatarsal. The first metatarsal is relatively long. No obvious  decreased joint space noted in the first metatarsophalangeal joint. High-pitched calcaneus noted.  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 Posterior and inferior heel spurs No acute bony abnormality noted          Assessment:  Hallux limitus right Plantar fasciitis right  Plan: Patient was advised to that her right first metatarsal phalangeal joint was uncomfortable because of the relatively long first metatarsal resulting in jamming of the joint. Today I attached a pad to her existing orthotic to offload the right first MPJ. Had a discussion with patient about treatment options going forward including possible surgical correction.  The patient will continue to use the existing orthotics for fasciitis as well as wearing the fasciitis strap that was dispensed. Detailed pronounced arch provided for fasciitis. In addition I recommend patient take over-the-counter Aleve twice a day on a regular basis.  Because patient has some interest in possible surgical intervention in regards to the hallux limitus right I am going to refer to Dr. Paulla Dolly who she requested she requested for possible surgical evaluation and treatment.

## 2013-07-17 NOTE — Patient Instructions (Signed)

## 2013-08-03 HISTORY — PX: BUNIONECTOMY: SHX129

## 2013-08-14 ENCOUNTER — Ambulatory Visit (INDEPENDENT_AMBULATORY_CARE_PROVIDER_SITE_OTHER): Payer: BC Managed Care – PPO | Admitting: Podiatry

## 2013-08-14 ENCOUNTER — Encounter: Payer: Self-pay | Admitting: Podiatry

## 2013-08-14 VITALS — BP 115/76 | HR 94 | Resp 12

## 2013-08-14 DIAGNOSIS — M2020 Hallux rigidus, unspecified foot: Secondary | ICD-10-CM

## 2013-08-14 NOTE — Progress Notes (Signed)
Subjective:     Patient ID: Karen Oconnell, female   DOB: [birth year], 48 y.o.   MRN: [id number]  HPI patient presents stating my heel is doing pretty well but my big toe joint continues to bother me and I would like to go ahead and get it fixed. She states it's been going on for several years it is gradually getting worse and she knows she is losing motion in the joint with pain   Review of Systems     Objective:   Physical Exam Neurovascular status intact with muscle strength adequate and no other issues from the physical standpoint noted patient has limitation of motion first MPJ right with mild crepitus but no indications of joint impingement. 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 Plantar fasciitis improved upon palpation    Plan:     Reviewed conditions and x-ray. Patient would like to have this fixed and I discussed correction of the first metatarsal with biplanar osteotomy. I allowed patient to read consent form for correction and spent a great deal of time going over the osteotomy and correction and the fact that long-term she may require fusion or joint implantation procedure. She understands complications and the told recovery. We'll take 6 months to one year and there is no long-term guarantees of results. Cam Walker dispensed with instructions and scheduled for outpatient surgery in the next several weeks and is encouraged to call with any further questions

## 2013-08-14 NOTE — Patient Instructions (Signed)
Pre-Operative Instructions  Congratulations, you have decided to take an important step to improving your quality of life.  You can be assured that the doctors of [facility] will be with you every step of the way.  1. Plan to be at the surgery center/hospital at least 1 (one) hour prior to your scheduled time unless otherwise directed by the surgical center/hospital staff.  You must have a responsible adult accompany you, remain during the surgery and drive you home.  Make sure you have directions to the surgical center/hospital and know how to get there on time. 2. For hospital based surgery you will need to obtain a history and physical form from your family physician within 1 month prior to the date of surgery- we will give you a form for you primary physician.  3. We make every effort to accommodate the date you request for surgery.  There are however, times where surgery dates or times have to be moved.  We will contact you as soon as possible if a change in schedule is required.   4. No Aspirin/Ibuprofen for one week before surgery.  If you are on aspirin, any non-steroidal anti-inflammatory medications (Mobic, Aleve, Ibuprofen) you should stop taking it 7 days prior to your surgery.  You make take Tylenol  For pain prior to surgery.  5. Medications- If you are taking daily heart and blood pressure medications, seizure, reflux, allergy, asthma, anxiety, pain or diabetes medications, make sure the surgery center/hospital is aware before the day of surgery so they may notify you which medications to take or avoid the day of surgery. 6. No food or drink after midnight the night before surgery unless directed otherwise by surgical center/hospital staff. 7. No alcoholic beverages 24 hours prior to surgery.  No smoking 24 hours prior to or 24 hours after surgery. 8. Wear loose pants or shorts- loose enough to fit over bandages, boots, and casts. 9. No slip on shoes, sneakers are best. 10. Bring  your boot with you to the surgery center/hospital.  Also bring crutches or a walker if your physician has prescribed it for you.  If you do not have this equipment, it will be provided for you after surgery. 11. If you have not been contracted by the surgery center/hospital by the day before your surgery, call to confirm the date and time of your surgery. 12. Leave-time from work may vary depending on the type of surgery you have.  Appropriate arrangements should be made prior to surgery with your employer. 13. Prescriptions will be provided immediately following surgery by your doctor.  Have these filled as soon as possible after surgery and take the medication as directed. 14. Remove nail polish on the operative foot. 15. Wash the night before surgery.  The night before surgery wash the foot and leg well with the antibacterial soap provided and water paying special attention to beneath the toenails and in between the toes.  Rinse thoroughly with water and dry well with a towel.  Perform this wash unless told not to do so by your physician.  Enclosed: 1 Ice pack (please put in freezer the night before surgery)   1 Hibiclens skin cleaner   Pre-op Instructions  If you have any questions regarding the instructions, do not hesitate to call our office.  [place]: [address] [place], [place] [address] [phone]  [place]: [address] [place], [place] [address] [phone]  [place]: [address]  [place], [place] [address] [phone]  Dr. Richard   Tuchman DPM, Dr. Norman Regal DPM Dr. Richard Sikora DPM, Dr. M. Todd Hyatt DPM, Dr. Kathryn Egerton DPM 

## 2013-08-15 ENCOUNTER — Telehealth: Payer: Self-pay | Admitting: *Deleted

## 2013-08-15 NOTE — Telephone Encounter (Signed)
DR Paulla Dolly requested I call pt, to pick up a air fracture boot for surgery.  I informed pt and she will pick up a AFW for a size 5 women.  Small AFW placed at reception desk.

## 2013-08-21 ENCOUNTER — Encounter: Payer: Self-pay | Admitting: Family Medicine

## 2013-08-21 ENCOUNTER — Ambulatory Visit (INDEPENDENT_AMBULATORY_CARE_PROVIDER_SITE_OTHER): Payer: BC Managed Care – PPO | Admitting: Family Medicine

## 2013-08-21 VITALS — BP 128/88 | HR 73 | Temp 98.5°F | Resp 18 | Ht 63.25 in | Wt 130.0 lb

## 2013-08-21 DIAGNOSIS — H6990 Unspecified Eustachian tube disorder, unspecified ear: Secondary | ICD-10-CM | POA: Insufficient documentation

## 2013-08-21 DIAGNOSIS — H7290 Unspecified perforation of tympanic membrane, unspecified ear: Secondary | ICD-10-CM

## 2013-08-21 DIAGNOSIS — H6980 Other specified disorders of Eustachian tube, unspecified ear: Secondary | ICD-10-CM | POA: Insufficient documentation

## 2013-08-21 MED ORDER — NEOMYCIN-POLYMYXIN-HC 3.5-10000-1 OT SOLN: 4.0000 [drp] | Freq: Four times a day (QID) | OTIC | Status: DC

## 2013-08-21 NOTE — Progress Notes (Signed)
Pre visit review using our clinic review tool, if applicable. No additional management support is needed unless otherwise documented below in the visit note. 

## 2013-08-21 NOTE — Progress Notes (Signed)
OFFICE NOTE  08/21/2013  CC:  Chief Complaint  Patient presents with  . Otalgia    on a plane thinks ear popped     HPI: Patient is a 48 y.o. Caucasian female who is here for ear pain. Some congestion and ear stuffiness/popping and then went on airplane 6 d/a and felt sharp pain in right ear and it popped and ear began to drain.  Draining has stopped and ear feels better now but she has decreased hearing in right ear.  Left ear still feels full/muffled but not as bad as right ear. Right EAC with significant itching since she had the drainage, bothering the pt quite a bit.  Pertinent PMH:  Past medical, surgical, and social history reviewed and no changes are noted.  MEDS:  Outpatient Prescriptions Prior to Visit  Medication Sig Dispense Refill  . ALPRAZolam (XANAX) 0.25 MG tablet TAKE 1 TABLET BY MOUTH AT BEDTIME AS NEEDED  30 tablet  1  . fluticasone (FLONASE) 50 MCG/ACT nasal spray Place 2 sprays into both nostrils daily.        Marland Kitchen ibuprofen (ADVIL,MOTRIN) 200 MG tablet Take 600 mg by mouth every 6 (six) hours as needed.        Marland Kitchen oxymetazoline (AFRIN) 0.05 % nasal spray Place 2 sprays into the nose 2 (two) times daily.        . sertraline (ZOLOFT) 100 MG tablet Take 150mg  daily  60 tablet  12  . cetirizine (ZYRTEC) 10 MG tablet Take 1 tablet (10 mg total) by mouth daily.  30 tablet  0  . Pseudoephedrine-DM-GG (SUDAFED COUGH PO) Take by mouth.       No facility-administered medications prior to visit.    PE: Blood pressure 128/88, pulse 73, temperature 98.5 F (36.9 C), temperature source Temporal, resp. rate 18, height 5' 3.25" (1.607 m), weight 130 lb (58.968 kg), last menstrual period 08/01/2013, SpO2 99.00%. Gen: Alert, well appearing.  Patient is oriented to person, place, time, and situation. EACs appear normal, without d/c or drainage.  Left TM intact, normal landmarks, slightly bulging, no fluid in middle ear. Right TM intact with significant retracted appearance,  without middle ear fluid or TM erythema.  Central TM with questionable area of thinning/early scar. Nose: clear. Throat/mouth normal. Weber: lateralized to right ear ("bad" ear).  Rinne: air>bone conduction bilat.  IMPRESSION AND PLAN:  Eustachian tube dysfunction, with recent right TM perf associated with airplane flight. TM is now intact.  Ongoing itching in right EAC likely from recent irritation from the middle ear drainage. Cortisporin otic gtts rx'd today for right EAC itching. Reassured pt, told her to continue flonase and continue blowing against closed nostrils to open eust tubes up periodically.  An After Visit Summary was printed and given to the patient.  FOLLOW UP: prn

## 2013-08-26 ENCOUNTER — Encounter: Payer: Self-pay | Admitting: Podiatry

## 2013-08-26 DIAGNOSIS — M2020 Hallux rigidus, unspecified foot: Secondary | ICD-10-CM

## 2013-09-01 ENCOUNTER — Ambulatory Visit: Payer: BC Managed Care – PPO | Admitting: Podiatry

## 2013-09-01 ENCOUNTER — Ambulatory Visit (INDEPENDENT_AMBULATORY_CARE_PROVIDER_SITE_OTHER): Payer: BC Managed Care – PPO

## 2013-09-01 ENCOUNTER — Encounter: Payer: BC Managed Care – PPO | Admitting: Podiatry

## 2013-09-01 VITALS — BP 110/75 | HR 103 | Resp 15 | Ht 64.0 in | Wt 130.0 lb

## 2013-09-01 DIAGNOSIS — M2020 Hallux rigidus, unspecified foot: Secondary | ICD-10-CM

## 2013-09-01 DIAGNOSIS — Z9889 Other specified postprocedural states: Secondary | ICD-10-CM

## 2013-09-01 NOTE — Progress Notes (Signed)
Subjective:     Patient ID: Karen Oconnell, female   DOB: 31-Oct-1965, 48 y.o.   MRN: [id number]  HPI patient presents stating I'm doing very well with my foot surgery with mild discomfort and swelling at 5 been on my foot to much. 6 days after having biplanar Austin right   Review of Systems     Objective:   Physical Exam Neurovascular status intact with negative Homans sign noted and well-healing surgical site first metatarsal right with good range of motion of 25 dorsiflexion 20 plantar flexion with no pain no crepitus within the joint surface    Assessment:     Doing well post hallux limitus rigidus surgery right    Plan:     X-ray reviewed and advised on range of motion exercises and dispense Darco shoe and Ace wrap. Instructed on continued immobilization and reappoint 3 weeks earlier if any issues should occur

## 2013-09-01 NOTE — Progress Notes (Signed)
Pt present for POV 1 for 08/26/2013 surgery of right foot - austin bunionectomy.  Pt's right foot has slight swelling and bruising, with redness or drainage.

## 2013-09-08 NOTE — Progress Notes (Signed)
Surgery performed by Dr Paulla Dolly - Right Bi-planar Liane Comber bunionectomy with pin fixation.

## 2013-09-22 ENCOUNTER — Ambulatory Visit (INDEPENDENT_AMBULATORY_CARE_PROVIDER_SITE_OTHER): Payer: BC Managed Care – PPO | Admitting: Podiatry

## 2013-09-22 ENCOUNTER — Ambulatory Visit (INDEPENDENT_AMBULATORY_CARE_PROVIDER_SITE_OTHER): Payer: BC Managed Care – PPO

## 2013-09-22 VITALS — BP 113/72 | HR 96 | Resp 16 | Ht 64.0 in | Wt 130.0 lb

## 2013-09-22 DIAGNOSIS — Z9889 Other specified postprocedural states: Secondary | ICD-10-CM

## 2013-09-22 DIAGNOSIS — M2020 Hallux rigidus, unspecified foot: Secondary | ICD-10-CM

## 2013-09-22 NOTE — Progress Notes (Signed)
Subjective:     Patient ID: Karen Oconnell, female   DOB: Jun 19, 1966, 48 y.o.   MRN: [id number]  HPI patient presents stating she's doing pretty well but still having some swelling but so far the joint feels good. 4 weeks after surgery right big toe joint   Review of Systems     Objective:   Physical Exam Neurovascular status unchanged negative Homans sign was noted and well-healing surgical site right first metatarsal. Range of motion is excellent with 30 of dorsiflexion 20 of plantar flexion with no pain no crepitus noted upon    Assessment:     Doing well from foot surgery right for hallux limitus deformity    Plan:     Discussed results and advised this patient on continued range of motion exercises and gradual return to saw shoe gear. Reappoint her recheck again in 4 weeks

## 2013-09-22 NOTE — Progress Notes (Signed)
   Subjective:    Patient ID: Karen Oconnell, female    DOB: 12/03/1965, 48 y.o.   MRN: [id number] Pt states she doing fine, tried a tennis shoe on Sunday, but could tell it was still swollen. HPI    Review of Systems     Objective:   Physical Exam        Assessment & Plan:

## 2013-11-03 ENCOUNTER — Encounter: Payer: BC Managed Care – PPO | Admitting: Podiatry

## 2013-11-03 ENCOUNTER — Ambulatory Visit (INDEPENDENT_AMBULATORY_CARE_PROVIDER_SITE_OTHER): Payer: BC Managed Care – PPO | Admitting: Podiatry

## 2013-11-03 ENCOUNTER — Ambulatory Visit (INDEPENDENT_AMBULATORY_CARE_PROVIDER_SITE_OTHER): Payer: BC Managed Care – PPO

## 2013-11-03 ENCOUNTER — Encounter: Payer: Self-pay | Admitting: Podiatry

## 2013-11-03 VITALS — BP 122/76 | HR 88 | Resp 12

## 2013-11-03 DIAGNOSIS — M205X9 Other deformities of toe(s) (acquired), unspecified foot: Secondary | ICD-10-CM

## 2013-11-03 DIAGNOSIS — M779 Enthesopathy, unspecified: Secondary | ICD-10-CM

## 2013-11-03 NOTE — Progress Notes (Signed)
Subjective:     Patient ID: Karen Oconnell, female   DOB: 07-07-1965, 48 y.o.   MRN: [id number]  HPI patient presents stating I'm doing pretty well with some stiffness of the joint still noted at 5 been walking a lot   Review of Systems     Objective:   Physical Exam Neurovascular status intact with incision site first MPJ healing well with approximate 25 dorsiflexion 20 of plantarflexion with no crepitus within the joint. No pain when pressed into the joint does have moderate discomfort in the foot in general    Assessment:     Doing well post hallux limitus surgery right with good structural integrity noted and moderate diminishment of the arch with tendinitis    Plan:     Doing well post hallux limitus surgery with tendinitis and recommendation long-term orthotics to reduce stress against her feet. Patient is scanned for custom orthotics at this time

## 2013-11-25 ENCOUNTER — Other Ambulatory Visit: Payer: BC Managed Care – PPO

## 2013-12-15 ENCOUNTER — Encounter: Payer: Self-pay | Admitting: Podiatry

## 2013-12-15 ENCOUNTER — Ambulatory Visit (INDEPENDENT_AMBULATORY_CARE_PROVIDER_SITE_OTHER): Payer: BC Managed Care – PPO | Admitting: Podiatry

## 2013-12-15 VITALS — BP 104/64 | HR 93 | Resp 16

## 2013-12-15 DIAGNOSIS — M2010 Hallux valgus (acquired), unspecified foot: Secondary | ICD-10-CM

## 2013-12-15 NOTE — Progress Notes (Signed)
Subjective:     Patient ID: Karen Oconnell, female   DOB: September 13, 1965, 48 y.o.   MRN: [id number]  HPI patient presents she is doing well with orthotics and is satisfied with the correction of her first metatarsal right with significant diminishment of pain   Review of Systems     Objective:   Physical Exam Neurovascular status intact with first MPJ with incision sites have healed well with range of motion of approximate 25 dorsiflexion 15 plantar flexion with no pain or crepitus noted    Assessment:     Doing well post limitus surgery right with some restriction of motion still noted    Plan:     Reviewed the importance of exercises and trying to increase the motion of the joint. Instructed on the importance of this

## 2014-04-09 ENCOUNTER — Ambulatory Visit (INDEPENDENT_AMBULATORY_CARE_PROVIDER_SITE_OTHER): Payer: BC Managed Care – PPO | Admitting: Family Medicine

## 2014-04-09 ENCOUNTER — Encounter: Payer: Self-pay | Admitting: Family Medicine

## 2014-04-09 VITALS — BP 108/76 | HR 88 | Temp 98.6°F | Ht 63.5 in | Wt 132.0 lb

## 2014-04-09 DIAGNOSIS — R9431 Abnormal electrocardiogram [ECG] [EKG]: Secondary | ICD-10-CM

## 2014-04-09 DIAGNOSIS — Z23 Encounter for immunization: Secondary | ICD-10-CM

## 2014-04-09 DIAGNOSIS — M7582 Other shoulder lesions, left shoulder: Secondary | ICD-10-CM

## 2014-04-09 DIAGNOSIS — Z0000 Encounter for general adult medical examination without abnormal findings: Secondary | ICD-10-CM

## 2014-04-09 DIAGNOSIS — R002 Palpitations: Secondary | ICD-10-CM

## 2014-04-09 LAB — CBC WITH DIFFERENTIAL/PLATELET
Basophils Absolute: 0 10*3/uL (ref 0.0–0.1)
Basophils Relative: 0.5 % (ref 0.0–3.0)
Eosinophils Absolute: 0 10*3/uL (ref 0.0–0.7)
Eosinophils Relative: 0.9 % (ref 0.0–5.0)
HCT: 41.3 % (ref 36.0–46.0)
Hemoglobin: 13.6 g/dL (ref 12.0–15.0)
Lymphocytes Relative: 37.8 % (ref 12.0–46.0)
Lymphs Abs: 1.9 10*3/uL (ref 0.7–4.0)
MCHC: 32.9 g/dL (ref 30.0–36.0)
MCV: 90 fl (ref 78.0–100.0)
Monocytes Absolute: 0.3 10*3/uL (ref 0.1–1.0)
Monocytes Relative: 6.8 % (ref 3.0–12.0)
Neutro Abs: 2.8 10*3/uL (ref 1.4–7.7)
Neutrophils Relative %: 54 % (ref 43.0–77.0)
Platelets: 322 10*3/uL (ref 150.0–400.0)
RBC: 4.58 Mil/uL (ref 3.87–5.11)
RDW: 13.8 % (ref 11.5–15.5)
WBC: 5.1 10*3/uL (ref 4.0–10.5)

## 2014-04-09 LAB — COMPREHENSIVE METABOLIC PANEL
ALT: 16 U/L (ref 0–35)
AST: 21 U/L (ref 0–37)
Albumin: 3.9 g/dL (ref 3.5–5.2)
Alkaline Phosphatase: 40 U/L (ref 39–117)
BUN: 13 mg/dL (ref 6–23)
CO2: 28 mEq/L (ref 19–32)
Calcium: 9.2 mg/dL (ref 8.4–10.5)
Chloride: 105 mEq/L (ref 96–112)
Creatinine, Ser: 0.7 mg/dL (ref 0.4–1.2)
GFR: 96.34 mL/min (ref >60.00)
Glucose, Bld: 94 mg/dL (ref 70–99)
Potassium: 4.1 mEq/L (ref 3.5–5.1)
Sodium: 139 mEq/L (ref 135–145)
Total Bilirubin: 0.6 mg/dL (ref 0.2–1.2)
Total Protein: 7.1 g/dL (ref 6.0–8.3)

## 2014-04-09 LAB — LIPID PANEL
Cholesterol: 235 mg/dL — ABNORMAL HIGH (ref 0–200)
HDL: 87.7 mg/dL (ref >39.00)
LDL Cholesterol: 135 mg/dL — ABNORMAL HIGH (ref 0–99)
NonHDL: 147.3
Total CHOL/HDL Ratio: 3
Triglycerides: 61 mg/dL (ref 0.0–149.0)
VLDL: 12.2 mg/dL (ref 0.0–40.0)

## 2014-04-09 LAB — TSH: TSH: 0.48 u[IU]/mL (ref 0.35–4.50)

## 2014-04-09 NOTE — Progress Notes (Signed)
Pre visit review using our clinic review tool, if applicable. No additional management support is needed unless otherwise documented below in the visit note. 

## 2014-04-09 NOTE — Progress Notes (Signed)
Office Note 04/19/2014  CC:  Chief Complaint  Patient presents with  . Annual Exam    HPI:  Karen Oconnell is a 48 y.o. White female who is here for CPE.   Had bunion surgery 08/2013, gained some weight after and wants to start exercising and wanted physical first. Having some random "heavy palpitations" and intermittent left upper arm pain. No injury to left arm.  No known trigger to palpitations.  Arm hurts when she raises it for a couple of months at least, getting worse, bothers her at night only when she lays on left upper arm.  Some lighter pain extending down left arm.  Some paresthesias in same distribution.  Also some mild left sided stiffness in neck along the same timeline.  No meds taken, no stretching done, no heat or ice applied.  Palps last 30 sec, happen several times a day, and they make her feel anxious, they alter her breathing some. No preceding anxiety.  No presyncope, syncope, or falls.  No chest pain, no dizziness or lightheadedness.  Mild increased sweating.   She now lives in dread of this happening.  Drinks 2 cups coffee in morning, 1 cola in afternoon most days--no change from her pre-palpitation routine.  Drinks 3-4 alcoholic drinks per week, mostly on weekends.  Palp's occur with same frequency whether she drinks alcohol or not.   Past Medical History  Diagnosis Date  . Depression   . Neck pain, musculoskeletal 02/06/2011  . Anxiety   . Gestational diabetes   . HSV infection   . Stress fracture 2008    left ankle  . Seasonal allergic rhinitis   . SINUSITIS, RECURRENT 07/05/2010    Qualifier: Diagnosis of  By: Charlett Blake MD, Erline Levine      Past Surgical History  Procedure Laterality Date  . Cesarean section  1997    X 1  . Eye surgery  age 60    right eye for strabismus  . Cyst removed from back of right leg  6-12  . Endometrial ablation  2010    HER OPTION ABLATION FOR MENORRHAGIA  . Bunionectomy  08/2013    right    Family History  Problem  Relation Age of Onset  . Diabetes Mother     Type 2  . Hypertension Mother   . Hyperlipidemia Mother   . Depression Mother   . Seasonal affective disorder Father   . Cancer Father     MELANOMA  . Bipolar disorder Brother   . Drug abuse Brother     cocaine, vicodin, pain killers  . ADD / ADHD Son   . Depression Son   . Cancer Maternal Grandmother     gyn possibly cervical or uterine  . Diabetes Maternal Grandfather     Type 2  . Hypertension Maternal Grandfather   . Depression Maternal Grandfather   . Alcohol abuse Paternal Grandfather     History   Social History  . Marital Status: Married    Spouse Name: N/A    Number of Children: N/A  . Years of Education: N/A   Occupational History  . Not on file.   Social History Main Topics  . Smoking status: Never Smoker   . Smokeless tobacco: Never Used  . Alcohol Use: 0.0 oz/week    0 drink(s) per week     Comment: occasionaly  . Drug Use: No  . Sexual Activity: Yes    Partners: Male    Birth Control/ Protection: Other-see comments  Comment: husband vasectomy   Other Topics Concern  . Not on file   Social History Narrative  . No narrative on file    Outpatient Prescriptions Prior to Visit  Medication Sig Dispense Refill  . fluticasone (FLONASE) 50 MCG/ACT nasal spray Place 2 sprays into both nostrils daily.        Marland Kitchen ibuprofen (ADVIL,MOTRIN) 200 MG tablet Take 600 mg by mouth every 6 (six) hours as needed.        . sertraline (ZOLOFT) 100 MG tablet Take 150mg  daily  60 tablet  12  . ALPRAZolam (XANAX) 0.25 MG tablet TAKE 1 TABLET BY MOUTH AT BEDTIME AS NEEDED  30 tablet  1  . cetirizine (ZYRTEC) 10 MG tablet Take 1 tablet (10 mg total) by mouth daily.  30 tablet  0  . meperidine (DEMEROL) 50 MG tablet Take 50 mg by mouth every 4 (four) hours as needed for severe pain.      Marland Kitchen neomycin-polymyxin-hydrocortisone (CORTISPORIN) otic solution Place 4 drops into the right ear 4 (four) times daily.  10 mL  0  .  oxymetazoline (AFRIN) 0.05 % nasal spray Place 2 sprays into the nose 2 (two) times daily.        . promethazine (PHENERGAN) 25 MG tablet Take 25 mg by mouth every 6 (six) hours as needed for nausea or vomiting.      . Pseudoephedrine-DM-GG (SUDAFED COUGH PO) Take by mouth.       No facility-administered medications prior to visit.    No Known Allergies  ROS Review of Systems  Constitutional: Negative for fever, chills, appetite change and fatigue.  HENT: Negative for congestion, dental problem, ear pain and sore throat.   Eyes: Negative for discharge, redness and visual disturbance.  Respiratory: Negative for cough, chest tightness, shortness of breath and wheezing.   Cardiovascular: Positive for palpitations (as pe HPI). Negative for chest pain and leg swelling.  Gastrointestinal: Negative for nausea, vomiting, abdominal pain, diarrhea and blood in stool.  Genitourinary: Negative for dysuria, urgency, frequency, hematuria, flank pain and difficulty urinating.  Musculoskeletal: Positive for arthralgias (as per HPI). Negative for back pain, joint swelling, myalgias and neck stiffness.  Skin: Negative for pallor and rash.  Neurological: Negative for dizziness, speech difficulty, weakness and headaches.  Hematological: Negative for adenopathy. Does not bruise/bleed easily.  Psychiatric/Behavioral: Negative for confusion and sleep disturbance. The patient is nervous/anxious (general).    PE; Blood pressure 108/76, pulse 88, temperature 98.6 F (37 C), temperature source Temporal, height 5' 3.5" (1.613 m), weight 132 lb (59.875 kg), last menstrual period 03/20/2014, SpO2 95.00%. Gen: Alert, well appearing.  Patient is oriented to person, place, time, and situation. AFFECT: pleasant, lucid thought and speech. ENT: Ears: EACs clear, normal epithelium.  TMs with good light reflex and landmarks bilaterally.  Eyes: no injection, icteris, swelling, or exudate.  EOMI, PERRLA. Nose: no drainage or  turbinate edema/swelling.  No injection or focal lesion.  Mouth: lips without lesion/swelling.  Oral mucosa pink and moist.  Dentition intact and without obvious caries or gingival swelling.  Oropharynx without erythema, exudate, or swelling.  Neck: supple/nontender.  No LAD, mass, or TM.  Carotid pulses 2+ bilaterally, without bruits. CV: RRR, no m/r/g.   LUNGS: CTA bilat, nonlabored resps, good aeration in all lung fields. ABD: soft, NT, ND, BS normal.  No hepatospenomegaly or mass.  No bruits. EXT: no clubbing, cyanosis, or edema.  Musculoskeletal: no joint swelling, erythema, or warmth. She has TTP in left c-spine paraspinous  soft tissues, left trapezius muscle superiorly and area around left acromion hook and in posterolateral region of left deltoid.  UE strength intact prox/dist.  UE DTR's 2+ in all areas and symmetric.  Pain with passive ABduction and IR and with resistance against these same ROM's. Neg drop sign.  +empty can sign.  Spurling's neg. 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  No delta wave.  Otherwise normal. No old EKG available for comparison.  ASSESSMENT AND PLAN:   Palpitations These do sound benign, but with unclear significance of short PR syndrome on today's EKG will arrange for 24h Holter for further evaluation. Electrolytes and TSH check today as well.  Rotator cuff tendonitis Left. Discussed options, decided on conservative mgmt---rest, NSAIDS, ice prn, ROM exercises without and then with resistance.  Health maintenance examination Reviewed age and gender appropriate health maintenance issues (prudent diet, regular exercise, health risks of tobacco and excessive alcohol, use of seatbelts, fire alarms in home, use of sunscreen).  Also reviewed age and gender appropriate health screening as well as vaccine recommendations. HP labs  today. Tdap today and flu IM today. Mammogram normal 05/2013.  She has a GYN who will arrange for f/u mammo and also do cervical ca screening/pelvic. Colon ca screening starting at age 36 yrs.   An After Visit Summary was printed and given to the patient.  FOLLOW UP:  Return in about 6 months (around 10/09/2014) for routine chronic illness f/u.

## 2014-04-13 ENCOUNTER — Encounter (INDEPENDENT_AMBULATORY_CARE_PROVIDER_SITE_OTHER): Payer: BC Managed Care – PPO

## 2014-04-13 ENCOUNTER — Encounter: Payer: Self-pay | Admitting: *Deleted

## 2014-04-13 DIAGNOSIS — R002 Palpitations: Secondary | ICD-10-CM

## 2014-04-13 NOTE — Progress Notes (Signed)
Patient ID: Karen Oconnell, female   DOB: March 07, 1966, 48 y.o.   MRN: [id number] [employer] 24 hour holter monitor applied to patient.

## 2014-04-19 ENCOUNTER — Encounter: Payer: Self-pay | Admitting: Family Medicine

## 2014-04-19 DIAGNOSIS — R002 Palpitations: Secondary | ICD-10-CM | POA: Insufficient documentation

## 2014-04-19 DIAGNOSIS — M7580 Other shoulder lesions, unspecified shoulder: Secondary | ICD-10-CM | POA: Insufficient documentation

## 2014-04-19 NOTE — Assessment & Plan Note (Addendum)
Reviewed age and gender appropriate health maintenance issues (prudent diet, regular exercise, health risks of tobacco and excessive alcohol, use of seatbelts, fire alarms in home, use of sunscreen).  Also reviewed age and gender appropriate health screening as well as vaccine recommendations. HP labs today. Tdap today and flu IM today. Mammogram normal 05/2013.  She has a GYN who will arrange for f/u mammo and also do cervical ca screening/pelvic. Colon ca screening starting at age 78 yrs.

## 2014-04-19 NOTE — Assessment & Plan Note (Addendum)
These do sound benign, but with unclear significance of short PR syndrome on today's EKG will arrange for 24h Holter for further evaluation. Electrolytes and TSH check today as well.

## 2014-04-19 NOTE — Assessment & Plan Note (Signed)
Left. Discussed options, decided on conservative mgmt---rest, NSAIDS, ice prn, ROM exercises without and then with resistance.

## 2014-05-04 ENCOUNTER — Encounter: Payer: Self-pay | Admitting: Family Medicine

## 2014-05-20 ENCOUNTER — Other Ambulatory Visit (HOSPITAL_COMMUNITY)
Admission: RE | Admit: 2014-05-20 | Discharge: 2014-05-20 | Disposition: A | Discharge status: Home / Self Care | Payer: BC Managed Care – PPO | Source: Ambulatory Visit | Attending: Gynecology | Admitting: Gynecology

## 2014-05-20 ENCOUNTER — Ambulatory Visit (INDEPENDENT_AMBULATORY_CARE_PROVIDER_SITE_OTHER): Payer: BC Managed Care – PPO | Admitting: Women's Health

## 2014-05-20 ENCOUNTER — Encounter: Payer: Self-pay | Admitting: Women's Health

## 2014-05-20 VITALS — BP 110/80 | Ht 63.0 in | Wt 133.0 lb

## 2014-05-20 DIAGNOSIS — F411 Generalized anxiety disorder: Secondary | ICD-10-CM

## 2014-05-20 DIAGNOSIS — Z01419 Encounter for gynecological examination (general) (routine) without abnormal findings: Secondary | ICD-10-CM

## 2014-05-20 MED ORDER — ALPRAZOLAM 0.25 MG PO TABS: ORAL_TABLET | ORAL | Status: DC

## 2014-05-20 NOTE — Patient Instructions (Signed)

## 2014-05-20 NOTE — Progress Notes (Signed)
DALEY MOORADIAN Nov 21, 1965 [id number]    History:    Presents for annual exam.  Monthly cycle/vasectomy. History of normal Paps and mammograms. 2013 benign breast biopsy.GDM. Anxiety/depression stable on Zoloft per primary care.  Past medical history, past surgical history, family history and social history were all reviewed and documented in the EPIC chart. Works part-time at a preschool. Father melanoma. Mother diabetes. Son struggles with depression, doing better. Jon Gills 17 doing well process of choosing college.  ROS:  A  12 point ROS was performed and pertinent positives and negatives are included.  Exam:  Filed Vitals:   05/20/14 1004  BP: 110/80    General appearance:  Normal Thyroid:  Symmetrical, normal in size, without palpable masses or nodularity. Respiratory  Auscultation:  Clear without wheezing or rhonchi Cardiovascular  Auscultation:  Regular rate, without rubs, murmurs or gallops  Edema/varicosities:  Not grossly evident Abdominal  Soft,nontender, without masses, guarding or rebound.  Liver/spleen:  No organomegaly noted  Hernia:  None appreciated  Skin  Inspection:  Grossly normal   Breasts: Examined lying and sitting.     Right: Without masses, retractions, discharge or axillary adenopathy.     Left: Without masses, retractions, discharge or axillary adenopathy. Gentitourinary   Inguinal/mons:  Normal without inguinal adenopathy  External genitalia:  Normal  BUS/Urethra/Skene's glands:  Normal  Vagina:  Normal  Cervix:  Normal  Uterus:   normal in size, shape and contour.  Midline and mobile  Adnexa/parametria:     Rt: Without masses or tenderness.   Lt: Without masses or tenderness.  Anus and perineum: Normal  Digital rectal exam: Normal sphincter tone without palpated masses or tenderness  Assessment/Plan:  48 y.o. MWF G2P2 for annual exam.     Right foot problem has follow-up scheduled Normal GYN exam/vasectomy Anxiety/depression stable on  Zoloft per primary care Labs primary care  Plan: SBE's, continue annual mammogram, calcium rich diet, vitamin D 1000 daily encouraged.  UA, Pap normal 2014, new screening guidelines reviewed. Continue annual skin checks/father history of melanoma.    [place], 1:38 PM 05/20/2014

## 2014-05-21 LAB — URINALYSIS W MICROSCOPIC + REFLEX CULTURE
Bacteria, UA: NONE SEEN
Bilirubin Urine: NEGATIVE
Casts: NONE SEEN
Crystals: NONE SEEN
Glucose, UA: NEGATIVE mg/dL
Hgb urine dipstick: NEGATIVE
Ketones, ur: NEGATIVE mg/dL
Leukocytes, UA: NEGATIVE
Nitrite: NEGATIVE
Protein, ur: NEGATIVE mg/dL
Specific Gravity, Urine: 1.023 (ref 1.005–1.030)
Squamous Epithelial / LPF: NONE SEEN
Urobilinogen, UA: 0.2 mg/dL (ref 0.0–1.0)
pH: 5.5 (ref 5.0–8.0)

## 2014-05-21 LAB — CYTOLOGY - PAP

## 2014-06-10 ENCOUNTER — Other Ambulatory Visit: Payer: Self-pay

## 2014-06-10 DIAGNOSIS — F411 Generalized anxiety disorder: Secondary | ICD-10-CM

## 2014-06-10 MED ORDER — SERTRALINE HCL 100 MG PO TABS: ORAL_TABLET | ORAL | Status: DC

## 2014-07-17 ENCOUNTER — Other Ambulatory Visit: Payer: Self-pay | Admitting: Women's Health

## 2014-07-17 DIAGNOSIS — Z1231 Encounter for screening mammogram for malignant neoplasm of breast: Secondary | ICD-10-CM

## 2014-07-20 ENCOUNTER — Ambulatory Visit (HOSPITAL_COMMUNITY)
Admission: RE | Admit: 2014-07-20 | Discharge: 2014-07-20 | Disposition: A | Discharge status: Home / Self Care | Payer: BLUE CROSS/BLUE SHIELD | Source: Ambulatory Visit | Attending: Women's Health | Admitting: Women's Health

## 2014-07-20 DIAGNOSIS — Z1231 Encounter for screening mammogram for malignant neoplasm of breast: Secondary | ICD-10-CM | POA: Insufficient documentation

## 2014-07-21 ENCOUNTER — Encounter: Payer: Self-pay | Admitting: Women's Health

## 2014-07-30 ENCOUNTER — Other Ambulatory Visit: Payer: Self-pay

## 2014-07-30 DIAGNOSIS — F411 Generalized anxiety disorder: Secondary | ICD-10-CM

## 2014-07-30 MED ORDER — ALPRAZOLAM 0.25 MG PO TABS: ORAL_TABLET | ORAL | Status: DC

## 2014-07-30 NOTE — Telephone Encounter (Signed)
Called into pharmacy

## 2014-10-09 ENCOUNTER — Encounter: Payer: Self-pay | Admitting: Family Medicine

## 2014-10-09 ENCOUNTER — Ambulatory Visit (INDEPENDENT_AMBULATORY_CARE_PROVIDER_SITE_OTHER): Payer: No Typology Code available for payment source | Admitting: Family Medicine

## 2014-10-09 VITALS — BP 104/62 | HR 81 | Temp 98.8°F | Ht 63.5 in | Wt 139.0 lb

## 2014-10-09 DIAGNOSIS — N951 Menopausal and female climacteric states: Secondary | ICD-10-CM

## 2014-10-09 DIAGNOSIS — F32A Depression, unspecified: Secondary | ICD-10-CM

## 2014-10-09 DIAGNOSIS — F329 Major depressive disorder, single episode, unspecified: Secondary | ICD-10-CM

## 2014-10-09 DIAGNOSIS — M2550 Pain in unspecified joint: Secondary | ICD-10-CM | POA: Diagnosis not present

## 2014-10-09 DIAGNOSIS — F411 Generalized anxiety disorder: Secondary | ICD-10-CM | POA: Diagnosis not present

## 2014-10-09 DIAGNOSIS — F418 Other specified anxiety disorders: Secondary | ICD-10-CM

## 2014-10-09 DIAGNOSIS — G4700 Insomnia, unspecified: Secondary | ICD-10-CM | POA: Diagnosis not present

## 2014-10-09 DIAGNOSIS — F419 Anxiety disorder, unspecified: Secondary | ICD-10-CM

## 2014-10-09 MED ORDER — ZOLPIDEM TARTRATE 10 MG PO TABS: 10.0000 mg | ORAL_TABLET | Freq: Every evening | ORAL | Status: DC | PRN

## 2014-10-09 MED ORDER — ALPRAZOLAM 0.25 MG PO TABS: ORAL_TABLET | ORAL | Status: DC

## 2014-10-09 NOTE — Progress Notes (Signed)
Pre visit review using our clinic review tool, if applicable. No additional management support is needed unless otherwise documented below in the visit note. 

## 2014-10-09 NOTE — Progress Notes (Signed)
OFFICE NOTE  10/09/2014  CC:  Chief Complaint  Patient presents with  . Follow-up    6 month   HPI: Patient is a 49 y.o. Caucasian female who is here for 6 mo f/u anxiety and depression. Has been exercising a lot since last visit and is frustrated with the fact that she has gained some wt. Has some hot flashes on daily basis but still having regular menses (LMP 10/08/14).  Sleep is poor in quality (trouble initiating and maintaining).  Xanax helps anxiety but not sleep.  Benadryl has paradoxic effect. No daytime naps.  Describes good sleep hygiene.  No RLS.  No caffeine past 4 pm.  Some on/off shoulder pains as well as thumb and great toe pains.  Mom recently dx'd with RA. Pt says sometimes her thumbs swell and big toes swell. She wants just to keep an eye on this for now.  She feels like she is stable on the zoloft, doesn't want to change this. Takes xanax prn, rarely, last dose was 09/15/14 per her report today.  Asks for RF to have this on hand as it is helpful for prn anxiety use.  Pertinent PMH:  Past medical, surgical, social, and family history reviewed and no changes are noted since last office visit.  MEDS:  Outpatient Prescriptions Prior to Visit  Medication Sig Dispense Refill  . ALPRAZolam (XANAX) 0.25 MG tablet TAKE 1 TABLET BY MOUTH AT BEDTIME AS NEEDED 30 tablet 1  . ibuprofen (ADVIL,MOTRIN) 200 MG tablet Take 600 mg by mouth every 6 (six) hours as needed.      . sertraline (ZOLOFT) 100 MG tablet Take 150mg  daily 60 tablet 12  . fluticasone (FLONASE) 50 MCG/ACT nasal spray Place 2 sprays into both nostrils daily.       No facility-administered medications prior to visit.    PE: Blood pressure 104/62, pulse 81, temperature 98.8 F (37.1 C), temperature source Oral, height 5' 3.5" (1.613 m), weight 139 lb (63.05 kg), last menstrual period 10/08/2014, SpO2 96 %. Gen: Alert, well appearing.  Patient is oriented to person, place, time, and situation. Musculoskeletal: no  joint swelling, erythema, warmth, or tenderness.  ROM of all joints intact.   IMPRESSION AND PLAN:  1) Perimenopausal syndrome: reassurance given. Primary symptom that his bothering her is the sleep dysfunction/insomnia.  She would like to try a rx sleep aid so we decided to start trial of zolpidem 10mg  qhs prn.  2) Anxiety and depression: stable, she uses xanax occasionally and gets good benefit from this. Rx RF'd today.  She feels very comfortable with current sertraline dosing.  3) Arthralgia mult joints; no sign of inflammatory arthritis.  Mom recently dx'd with RA has pt worried but she wants to just keep an eye on her sx's, no testing at this time and I agree with this course of action.  An After Visit Summary was printed and given to the patient.  FOLLOW UP: 6 mo

## 2014-12-28 ENCOUNTER — Other Ambulatory Visit: Payer: Self-pay

## 2015-01-21 ENCOUNTER — Emergency Department (HOSPITAL_BASED_OUTPATIENT_CLINIC_OR_DEPARTMENT_OTHER): Payer: No Typology Code available for payment source

## 2015-01-21 ENCOUNTER — Emergency Department (HOSPITAL_BASED_OUTPATIENT_CLINIC_OR_DEPARTMENT_OTHER)
Admission: EM | Admit: 2015-01-21 | Discharge: 2015-01-21 | Disposition: A | Discharge status: Home / Self Care | Payer: No Typology Code available for payment source | Attending: Emergency Medicine | Admitting: Emergency Medicine

## 2015-01-21 ENCOUNTER — Encounter (HOSPITAL_BASED_OUTPATIENT_CLINIC_OR_DEPARTMENT_OTHER): Payer: Self-pay

## 2015-01-21 DIAGNOSIS — F419 Anxiety disorder, unspecified: Secondary | ICD-10-CM | POA: Diagnosis not present

## 2015-01-21 DIAGNOSIS — Y92009 Unspecified place in unspecified non-institutional (private) residence as the place of occurrence of the external cause: Secondary | ICD-10-CM | POA: Insufficient documentation

## 2015-01-21 DIAGNOSIS — Z8781 Personal history of (healed) traumatic fracture: Secondary | ICD-10-CM | POA: Diagnosis not present

## 2015-01-21 DIAGNOSIS — Z79899 Other long term (current) drug therapy: Secondary | ICD-10-CM | POA: Insufficient documentation

## 2015-01-21 DIAGNOSIS — S20221A Contusion of right back wall of thorax, initial encounter: Secondary | ICD-10-CM

## 2015-01-21 DIAGNOSIS — F329 Major depressive disorder, single episode, unspecified: Secondary | ICD-10-CM | POA: Insufficient documentation

## 2015-01-21 DIAGNOSIS — S99921A Unspecified injury of right foot, initial encounter: Secondary | ICD-10-CM | POA: Diagnosis present

## 2015-01-21 DIAGNOSIS — Z8632 Personal history of gestational diabetes: Secondary | ICD-10-CM | POA: Insufficient documentation

## 2015-01-21 DIAGNOSIS — S300XXA Contusion of lower back and pelvis, initial encounter: Secondary | ICD-10-CM | POA: Insufficient documentation

## 2015-01-21 DIAGNOSIS — Y9389 Activity, other specified: Secondary | ICD-10-CM | POA: Diagnosis not present

## 2015-01-21 DIAGNOSIS — Z8619 Personal history of other infectious and parasitic diseases: Secondary | ICD-10-CM | POA: Insufficient documentation

## 2015-01-21 DIAGNOSIS — Z8709 Personal history of other diseases of the respiratory system: Secondary | ICD-10-CM | POA: Insufficient documentation

## 2015-01-21 DIAGNOSIS — Y998 Other external cause status: Secondary | ICD-10-CM | POA: Diagnosis not present

## 2015-01-21 DIAGNOSIS — W108XXA Fall (on) (from) other stairs and steps, initial encounter: Secondary | ICD-10-CM | POA: Diagnosis not present

## 2015-01-21 MED ORDER — IBUPROFEN 400 MG PO TABS
600.0000 mg | ORAL_TABLET | Freq: Once | ORAL | Status: AC
  Administered 2015-01-21: 600 mg via ORAL
  Filled 2015-01-21 (×2): qty 1

## 2015-01-21 NOTE — ED Provider Notes (Signed)
CSN: [id number]     Arrival date & time 01/21/15  1252 History   First MD Initiated Contact with Patient 01/21/15 1301     Chief Complaint  Patient presents with  . Foot Injury     (Consider location/radiation/quality/duration/timing/severity/associated sxs/prior Treatment) HPI Comments: Pt. Is a 49 y/o F with a hx of arthritis / bunyonectomy of right foot here after experiencing a fall on the stairs at her home on 7/15. She says that she initially had bruising of her back, but on Monday (7/18) she says that she noticed swelling and pain in her right foot. She says that she did not have any bruising. She has been able to ambulate well, though she says that it is painful to ambulate. She has used ice and advil at home. She has been able to get around well enough, but was worried that she may have broken her foot given that it has been somewhat swollen since Monday. She has had surgery in that foot before for a bunyonectomy. She says she does not remember hitting her foot, twisting it, or landing on it at all whenever she fell on the 15th, and she says it did not initially bother her but only bothered her 3 days later on the 18th. She did not have any injuries in the interim.   The history is provided by the patient.    Past Medical History  Diagnosis Date  . Depression   . Neck pain, musculoskeletal 02/06/2011  . Anxiety   . Gestational diabetes   . HSV infection   . Stress fracture 2008    left ankle  . Seasonal allergic rhinitis   . SINUSITIS, RECURRENT 07/05/2010    Qualifier: Diagnosis of  By: Charlett Blake MD, Erline Levine     Past Surgical History  Procedure Laterality Date  . Cesarean section  1997    X 1  . Eye surgery  age 70    right eye for strabismus  . Cyst removed from back of right leg  6-12  . Endometrial ablation  2010    HER OPTION ABLATION FOR MENORRHAGIA  . Bunionectomy  08/2013    right   Family History  Problem Relation Age of Onset  . Diabetes Mother     Type 2  .  Hypertension Mother   . Hyperlipidemia Mother   . Depression Mother   . Seasonal affective disorder Father   . Cancer Father     MELANOMA  . Bipolar disorder Brother   . Drug abuse Brother     cocaine, vicodin, pain killers  . ADD / ADHD Son   . Depression Son   . Cancer Maternal Grandmother     gyn possibly cervical or uterine  . Diabetes Maternal Grandfather     Type 2  . Hypertension Maternal Grandfather   . Depression Maternal Grandfather   . Alcohol abuse Paternal Grandfather    History  Substance Use Topics  . Smoking status: Never Smoker   . Smokeless tobacco: Never Used  . Alcohol Use: 0.0 oz/week    0 drink(s) per week     Comment: occasionaly   OB History    Gravida Para Term Preterm AB TAB SAB Ectopic Multiple Living   2 2        2      Review of Systems  Constitutional: Negative.  Negative for fever, chills, activity change, fatigue and unexpected weight change.  HENT: Negative.  Negative for congestion.   Eyes: Negative.  Negative  for photophobia and pain.  Respiratory: Negative.  Negative for cough, chest tightness, shortness of breath and wheezing.   Cardiovascular: Negative.  Negative for chest pain, palpitations and leg swelling.  Gastrointestinal: Negative.  Negative for nausea, vomiting, abdominal pain, diarrhea and abdominal distention.  Endocrine: Negative.  Negative for cold intolerance, polydipsia and polyuria.  Genitourinary: Negative.  Negative for dysuria, urgency, frequency, hematuria, flank pain and dyspareunia.  Musculoskeletal: Positive for back pain, arthralgias and gait problem. Negative for myalgias and joint swelling.  Skin: Negative.  Negative for pallor and rash.  Allergic/Immunologic: Negative.   Neurological: Negative.  Negative for weakness and numbness.  Hematological: Negative.   Psychiatric/Behavioral: Negative.       Allergies  Review of patient's allergies indicates no known allergies.  Home Medications   Prior to  Admission medications   Medication Sig Start Date End Date Taking? Authorizing Provider  ALPRAZolam Duanne Moron) 0.25 MG tablet TAKE 1 TABLET BY MOUTH AT BEDTIME AS NEEDED 10/09/14   Tammi Sou, MD  ibuprofen (ADVIL,MOTRIN) 200 MG tablet Take 600 mg by mouth every 6 (six) hours as needed.      Historical Provider, MD  sertraline (ZOLOFT) 100 MG tablet Take 150mg  daily 06/10/14   Huel Cote, NP  zolpidem (AMBIEN) 10 MG tablet Take 1 tablet (10 mg total) by mouth at bedtime as needed for sleep. 10/09/14 11/08/14  Tammi Sou, MD   BP 154/87 mmHg  Pulse 80  Temp(Src) 98.2 F (36.8 C) (Oral)  Resp 18  Ht 5\' 4"  (1.626 m)  Wt 140 lb (63.504 kg)  BMI 24.02 kg/m2  SpO2 99%  LMP 01/20/2015 (Approximate) Physical Exam  Constitutional: She is oriented to person, place, and time. She appears well-developed and well-nourished. No distress.  HENT:  Head: Normocephalic and atraumatic.  Nose: Nose normal.  Mouth/Throat: Oropharynx is clear and moist. No oropharyngeal exudate.  Eyes: Conjunctivae and EOM are normal. Pupils are equal, round, and reactive to light.  Neck: Normal range of motion. No thyromegaly present.  Cardiovascular: Normal rate, regular rhythm, normal heart sounds and intact distal pulses.  Exam reveals no gallop and no friction rub.   No murmur heard. Pulmonary/Chest: Effort normal and breath sounds normal. No respiratory distress. She has no wheezes. She exhibits no tenderness.  Abdominal: Soft. Bowel sounds are normal. She exhibits no distension and no mass. There is no tenderness. There is no rebound and no guarding.  Musculoskeletal: Normal range of motion. She exhibits tenderness. She exhibits no edema.       Right ankle: She exhibits normal range of motion, no swelling and no ecchymosis. Tenderness. No lateral malleolus, no medial malleolus, no AITFL, no CF ligament, no posterior TFL, no head of 5th metatarsal and no proximal fibula tenderness found. Achilles tendon normal.        Lumbar back: She exhibits swelling. She exhibits normal range of motion, no tenderness, no bony tenderness, no edema, no deformity, no pain and no spasm.       Back:       Feet:  Lymphadenopathy:    She has no cervical adenopathy.  Neurological: She is alert and oriented to person, place, and time. No cranial nerve deficit. She exhibits normal muscle tone. Coordination normal.  Skin: Skin is warm and dry. No rash noted. She is not diaphoretic. No erythema.  Psychiatric: She has a normal mood and affect. Her behavior is normal.    ED Course  Procedures (including critical care time) Labs Review Labs  Reviewed - No data to display  Imaging Review Dg Foot Complete Right  01/21/2015   CLINICAL DATA:  Dorsal and plantar midfoot pain after falling 3 days ago. Initial encounter.  EXAM: RIGHT FOOT COMPLETE - 3+ VIEW  COMPARISON:  Office radiographs 11/03/2013 and 09/22/2013.  FINDINGS: The mineralization and alignment are normal. There is no evidence of acute fracture or dislocation. The alignment at the Lisfranc joint appears normal. There are postsurgical changes within the first metatarsal head with K-wires. There has been significant progression in arthropathic changes at the first metatarsal phalangeal joint with joint space loss, osteophytes and a erosions, especially in the first metatarsal head.  IMPRESSION: 1. No acute posttraumatic findings. 2. Significant progression of arthropathic changes at the first MTP joint suggesting gout. Correlate clinically.   Electronically Signed   By: Richardean Sale M.D.   On: 01/21/2015 13:32     EKG Interpretation None      MDM   Final diagnoses:  Right foot injury, initial encounter  Back contusion, right, initial encounter    Pt. Is a 49 y/o F here after injury of her right foot and back. She has some mild swelling of her right foot. XR of right foot without any acute fracture. No bony abnormalities. Likely soft tissue injury. RICE therapy  with NSAID's for inflammation and pain control. Low back on the right with slight soft tissue swelling and TTP. NSAIDS for pain. Follow up with PCP in 1 week to ensure that things are continuing to improve. Return precautions given. Safe for discharge to home.     Aquilla Hacker, MD 01/21/15 1421  Virgel Manifold, MD 01/22/15 [phone]

## 2015-01-21 NOTE — Discharge Instructions (Signed)
Contusion A contusion is a deep bruise. Contusions are the result of an injury that caused bleeding under the skin. The contusion may turn blue, purple, or yellow. Minor injuries will give you a painless contusion, but more severe contusions may stay painful and swollen for a few weeks.  CAUSES  A contusion is usually caused by a blow, trauma, or direct force to an area of the body. SYMPTOMS   Swelling and redness of the injured area.  Bruising of the injured area.  Tenderness and soreness of the injured area.  Pain. DIAGNOSIS  The diagnosis can be made by taking a history and physical exam. An X-ray, CT scan, or MRI may be needed to determine if there were any associated injuries, such as fractures. TREATMENT  Specific treatment will depend on what area of the body was injured. In general, the best treatment for a contusion is resting, icing, elevating, and applying cold compresses to the injured area. Over-the-counter medicines may also be recommended for pain control. Ask your caregiver what the best treatment is for your contusion. HOME CARE INSTRUCTIONS   Put ice on the injured area.  Put ice in a plastic bag.  Place a towel between your skin and the bag.  Leave the ice on for 15-20 minutes, 3-4 times a day, or as directed by your health care provider.  Only take over-the-counter or prescription medicines for pain, discomfort, or fever as directed by your caregiver. Your caregiver may recommend avoiding anti-inflammatory medicines (aspirin, ibuprofen, and naproxen) for 48 hours because these medicines may increase bruising.  Rest the injured area.  If possible, elevate the injured area to reduce swelling. SEEK IMMEDIATE MEDICAL CARE IF:   You have increased bruising or swelling.  You have pain that is getting worse.  Your swelling or pain is not relieved with medicines. MAKE SURE YOU:   Understand these instructions.  Will watch your condition.  Will get help right  away if you are not doing well or get worse. Document Released: 03/29/2005 Document Revised: 06/24/2013 Document Reviewed: 04/24/2011 [facility] Patient Information 2015 [place], [place]. This information is not intended to replace advice given to you by your health care provider. Make sure you discuss any questions you have with your health care provider.    RICE: Routine Care for Injuries The routine care of many injuries includes Rest, Ice, Compression, and Elevation (RICE). HOME CARE INSTRUCTIONS  Rest is needed to allow your body to heal. Routine activities can usually be resumed when comfortable. Injured tendons and bones can take up to 6 weeks to heal. Tendons are the cord-like structures that attach muscle to bone.  Ice following an injury helps keep the swelling down and reduces pain.  Put ice in a plastic bag.  Place a towel between your skin and the bag.  Leave the ice on for 15-20 minutes, 3-4 times a day, or as directed by your health care provider. Do this while awake, for the first 24 to 48 hours. After that, continue as directed by your caregiver.  Compression helps keep swelling down. It also gives support and helps with discomfort. If an elastic bandage has been applied, it should be removed and reapplied every 3 to 4 hours. It should not be applied tightly, but firmly enough to keep swelling down. Watch fingers or toes for swelling, bluish discoloration, coldness, numbness, or excessive pain. If any of these problems occur, remove the bandage and reapply loosely. Contact your caregiver if these problems continue.  Elevation  helps reduce swelling and decreases pain. With extremities, such as the arms, hands, legs, and feet, the injured area should be placed near or above the level of the heart, if possible. SEEK IMMEDIATE MEDICAL CARE IF:  You have persistent pain and swelling.  You develop redness, numbness, or unexpected weakness.  Your symptoms are getting worse rather  than improving after several days. These symptoms may indicate that further evaluation or further X-rays are needed. Sometimes, X-rays may not show a small broken bone (fracture) until 1 week or 10 days later. Make a follow-up appointment with your caregiver. Ask when your X-ray results will be ready. Make sure you get your X-ray results. Document Released: 10/01/2000 Document Revised: 06/24/2013 Document Reviewed: 11/18/2010 [facility] Patient Information 2015 [place], [place]. This information is not intended to replace advice given to you by your health care provider. Make sure you discuss any questions you have with your health care provider.

## 2015-01-21 NOTE — ED Notes (Signed)
Tripped over dog 6 days ago and fell down a few stairs injuring right foot and back.  Foot pain has worsened over the past 2 days.

## 2015-01-21 NOTE — ED Notes (Signed)
Denies numbness, tingling in extremities, bowel or bladder incontinence or saddle anesthesia. Limping gait due to right foot injury.

## 2015-03-12 ENCOUNTER — Encounter: Payer: Self-pay | Admitting: Gynecology

## 2015-03-12 ENCOUNTER — Ambulatory Visit (INDEPENDENT_AMBULATORY_CARE_PROVIDER_SITE_OTHER): Payer: No Typology Code available for payment source | Admitting: Gynecology

## 2015-03-12 VITALS — BP 118/78 | Ht 63.0 in | Wt 132.0 lb

## 2015-03-12 DIAGNOSIS — N898 Other specified noninflammatory disorders of vagina: Secondary | ICD-10-CM | POA: Diagnosis not present

## 2015-03-12 DIAGNOSIS — R350 Frequency of micturition: Secondary | ICD-10-CM

## 2015-03-12 LAB — URINALYSIS W MICROSCOPIC + REFLEX CULTURE
Bilirubin Urine: NEGATIVE
Casts: NONE SEEN [LPF]
Crystals: NONE SEEN [HPF]
Hgb urine dipstick: NEGATIVE
Leukocytes, UA: NEGATIVE
Nitrite: NEGATIVE
Protein, ur: NEGATIVE
RBC / HPF: NONE SEEN RBC/HPF (ref <=2)
Specific Gravity, Urine: 1.025 (ref 1.001–1.035)
Yeast: NONE SEEN [HPF]
pH: 5.5 (ref 5.0–8.0)

## 2015-03-12 LAB — WET PREP FOR TRICH, YEAST, CLUE
Clue Cells Wet Prep HPF POC: NONE SEEN
Trich, Wet Prep: NONE SEEN

## 2015-03-12 MED ORDER — SULFAMETHOXAZOLE-TRIMETHOPRIM 800-160 MG PO TABS: 1.0000 | ORAL_TABLET | Freq: Two times a day (BID) | ORAL | Status: DC

## 2015-03-12 MED ORDER — FLUCONAZOLE 150 MG PO TABS: 150.0000 mg | ORAL_TABLET | Freq: Once | ORAL | Status: DC

## 2015-03-12 NOTE — Patient Instructions (Signed)
Take the Septra antibiotic twice daily for 3 days Take the Diflucan pill daily for 3 days. Follow up if your symptoms persist, worsen or recur.

## 2015-03-12 NOTE — Progress Notes (Signed)
Karen Oconnell 07/12/1965 [id number]        49 y.o.  G2P2  Presents with one-week history of vaginal itching and discharge. Also notes some odor. Used OTC Monistat last week without relief.  Most recently over the last several days has developed frequency. No dysuria but some suprapubic discomfort. No low back pain or fevers.  Past medical history,surgical history, problem list, medications, allergies, family history and social history were all reviewed and documented in the EPIC chart.  Directed ROS with pertinent positives and negatives documented in the history of present illness/assessment and plan.  Exam: Wandra Scot assistant Filed Vitals:   03/12/15 1355  BP: 118/78  Height: 5\' 3"  (1.6 m)  Weight: 132 lb (59.875 kg)   General appearance:  Normal Spine straight without CVA tenderness Abdomen soft nontender without masses guarding rebound Pelvic external BUS vagina with white cakey discharge. Cervix normal. Uterus normal size midline mobile nontender. Adnexa without masses or tenderness.  Assessment/Plan:  49 y.o. G2P2 with symptoms, exam and wet prep consistent with yeast vaginitis. Urinalysis shows few bacteria. Going to cover her both for the yeast and possible early UTI as it is the weekend and will be several days before culture returns. Diflucan 150 mg 3 days Septra DS 1 by mouth twice a day 3 days. Follow up if symptoms persist, worsen or recur.    Anastasio Auerbach MD, 2:29 PM 03/12/2015

## 2015-03-15 LAB — URINE CULTURE: Colony Count: 7000

## 2015-04-06 ENCOUNTER — Other Ambulatory Visit: Payer: Self-pay | Admitting: *Deleted

## 2015-04-06 MED ORDER — ZOLPIDEM TARTRATE 10 MG PO TABS: 10.0000 mg | ORAL_TABLET | Freq: Every evening | ORAL | Status: DC | PRN

## 2015-04-06 NOTE — Telephone Encounter (Signed)
Rx faxed

## 2015-04-06 NOTE — Telephone Encounter (Signed)
RF request for zolpidem LOV: 10/09/14 Next ov: 04/09/15 Last written: unknown Please advise. Thanks.

## 2015-04-09 ENCOUNTER — Encounter: Payer: Self-pay | Admitting: Family Medicine

## 2015-04-09 ENCOUNTER — Ambulatory Visit (INDEPENDENT_AMBULATORY_CARE_PROVIDER_SITE_OTHER): Payer: No Typology Code available for payment source | Admitting: Family Medicine

## 2015-04-09 VITALS — BP 118/78 | HR 67 | Temp 98.3°F | Resp 16 | Ht 63.0 in | Wt 132.0 lb

## 2015-04-09 DIAGNOSIS — F411 Generalized anxiety disorder: Secondary | ICD-10-CM

## 2015-04-09 DIAGNOSIS — F418 Other specified anxiety disorders: Secondary | ICD-10-CM | POA: Diagnosis not present

## 2015-04-09 DIAGNOSIS — F329 Major depressive disorder, single episode, unspecified: Secondary | ICD-10-CM

## 2015-04-09 DIAGNOSIS — Z23 Encounter for immunization: Secondary | ICD-10-CM | POA: Diagnosis not present

## 2015-04-09 DIAGNOSIS — G4700 Insomnia, unspecified: Secondary | ICD-10-CM

## 2015-04-09 DIAGNOSIS — F32A Depression, unspecified: Secondary | ICD-10-CM

## 2015-04-09 DIAGNOSIS — F419 Anxiety disorder, unspecified: Secondary | ICD-10-CM

## 2015-04-09 MED ORDER — ALPRAZOLAM 0.25 MG PO TABS: ORAL_TABLET | ORAL | Status: DC

## 2015-04-09 NOTE — Addendum Note (Signed)
Addended by: Lanae Crumbly on: 04/09/2015 03:56 PM   Modules accepted: Orders

## 2015-04-09 NOTE — Progress Notes (Signed)
OFFICE VISIT  04/09/2015   CC:  Chief Complaint  Patient presents with  . Follow-up     HPI:    Patient is a 49 y.o. Caucasian female who presents for 6 mo f/u anxiety/depression and insomnia. Feeling really well since getting on ambien qhs.  Feels more energetic during day, mentally feels better off.  She is past due for routine GYN f/u, says she'll be making this appt soon. Still using xanax 0.25mg  qd prn--takes it most days, needs rx today. Doing well on current sertraline dosing.    Past Medical History  Diagnosis Date  . Depression   . Neck pain, musculoskeletal 02/06/2011  . Anxiety   . Gestational diabetes   . HSV infection   . Stress fracture 2008    left ankle  . Seasonal allergic rhinitis   . SINUSITIS, RECURRENT 07/05/2010    Qualifier: Diagnosis of  By: Charlett Blake MD, Erline Levine      Past Surgical History  Procedure Laterality Date  . Cesarean section  1997    X 1  . Eye surgery  age 72    right eye for strabismus  . Cyst removed from back of right leg  6-12  . Endometrial ablation  2010    HER OPTION ABLATION FOR MENORRHAGIA  . Bunionectomy  08/2013    right   MEDS: pt not taking diflucan or bactrim listed below Outpatient Prescriptions Prior to Visit  Medication Sig Dispense Refill  . ibuprofen (ADVIL,MOTRIN) 200 MG tablet Take 600 mg by mouth every 6 (six) hours as needed.      . sertraline (ZOLOFT) 100 MG tablet Take 150mg  daily 60 tablet 12  . zolpidem (AMBIEN) 10 MG tablet Take 1 tablet (10 mg total) by mouth at bedtime as needed. for sleep 30 tablet 5  . ALPRAZolam (XANAX) 0.25 MG tablet TAKE 1 TABLET BY MOUTH AT BEDTIME AS NEEDED 30 tablet 5  . fluconazole (DIFLUCAN) 150 MG tablet Take 1 tablet (150 mg total) by mouth once. (Patient not taking: Reported on 04/09/2015) 1 tablet 0  . sulfamethoxazole-trimethoprim (BACTRIM DS,SEPTRA DS) 800-160 MG per tablet Take 1 tablet by mouth 2 (two) times daily. (Patient not taking: Reported on 04/09/2015) 3 tablet 0    No facility-administered medications prior to visit.    No Known Allergies  ROS As per HPI  PE: Blood pressure 118/78, pulse 67, temperature 98.3 F (36.8 C), temperature source Oral, resp. rate 16, height 5\' 3"  (1.6 m), weight 132 lb (59.875 kg), last menstrual period 03/01/2015, SpO2 93 %. Gen: Alert, well appearing.  Patient is oriented to person, place, time, and situation. ZOX:WRUE: no injection, icteris, swelling, or exudate.  EOMI, PERRLA. Mouth: lips without lesion/swelling.  Oral mucosa pink and moist. Oropharynx without erythema, exudate, or swelling.  CV: RRR, no m/r/g.   LUNGS: CTA bilat, nonlabored resps, good aeration in all lung fields. EXT: no clubbing, cyanosis, or edema.    LABS:  none  IMPRESSION AND PLAN:  1) Anxiety/depression: doing well on current med regimen. Rx for alprazolam 0.25mg , 1 tab qd prn, #30, RF x 5 given to pt today.  2) Insomnia: doing very well on nightly ambien 10mg .  3) Prev health care: Tdap and flu vaccine today.  An After Visit Summary was printed and given to the patient.   FOLLOW UP: Return in about 6 months (around 10/08/2015) for annual CPE (fasting).

## 2015-04-09 NOTE — Progress Notes (Signed)
Pre visit review using our clinic review tool, if applicable. No additional management support is needed unless otherwise documented below in the visit note. 

## 2015-05-10 ENCOUNTER — Ambulatory Visit (INDEPENDENT_AMBULATORY_CARE_PROVIDER_SITE_OTHER): Payer: No Typology Code available for payment source

## 2015-05-10 ENCOUNTER — Encounter: Payer: Self-pay | Admitting: Podiatry

## 2015-05-10 ENCOUNTER — Ambulatory Visit (INDEPENDENT_AMBULATORY_CARE_PROVIDER_SITE_OTHER): Payer: No Typology Code available for payment source | Admitting: Podiatry

## 2015-05-10 VITALS — BP 124/88 | HR 81 | Resp 16

## 2015-05-10 DIAGNOSIS — S93601A Unspecified sprain of right foot, initial encounter: Secondary | ICD-10-CM

## 2015-05-10 DIAGNOSIS — S92301A Fracture of unspecified metatarsal bone(s), right foot, initial encounter for closed fracture: Secondary | ICD-10-CM | POA: Diagnosis not present

## 2015-05-10 DIAGNOSIS — M79671 Pain in right foot: Secondary | ICD-10-CM

## 2015-05-12 NOTE — Progress Notes (Signed)
Subjective:     Patient ID: Karen Oconnell, female   DOB: [birth year], 49 y.o.   MRN: [id number]  HPI patient presents with quite a bit of discomfort on the dorsum of the right foot that's been present for about a month and she does have significant obesity which is complicating and she does utilize a wheelchair   Review of Systems  All other systems reviewed and are negative.      Objective:   Physical Exam  Constitutional: She is oriented to person, place, and time.  Cardiovascular: Intact distal pulses.   Musculoskeletal: Normal range of motion.  Neurological: She is oriented to person, place, and time.  Skin: Skin is warm and dry.  Nursing note and vitals reviewed.  neurovascular status found to be intact with muscle strength reduced but adequate and range of motion diminished of the subtalar midtarsal joint. Patient's found to have quite a bit of discomfort in the dorsum of the right foot with inflammation of the tendon complex that's localized and does have reduced function of the extensor complex due to pain area she is found to have good digital perfusion is well oriented 3     Assessment:      inflammatory tendinitis dorsal right foot with moderate structural changes in both feet    Plan:      H&P and x-rays reviewed of both feet. Today I injected the dorsal sheath complex 3 mg Kenalog 5 mg Xylocaine advised on heat and ice therapy and reduced activity. We will see her back to judge results

## 2015-05-31 ENCOUNTER — Telehealth: Payer: Self-pay | Admitting: *Deleted

## 2015-05-31 ENCOUNTER — Ambulatory Visit (INDEPENDENT_AMBULATORY_CARE_PROVIDER_SITE_OTHER): Payer: No Typology Code available for payment source | Admitting: Podiatry

## 2015-05-31 DIAGNOSIS — S93601A Unspecified sprain of right foot, initial encounter: Secondary | ICD-10-CM | POA: Diagnosis not present

## 2015-05-31 DIAGNOSIS — S92301A Fracture of unspecified metatarsal bone(s), right foot, initial encounter for closed fracture: Secondary | ICD-10-CM | POA: Diagnosis not present

## 2015-05-31 MED ORDER — NONFORMULARY OR COMPOUNDED ITEM: Status: DC

## 2015-05-31 NOTE — Telephone Encounter (Signed)
Dr. Paulla Dolly ordered [facility] compound: Anti-inflammatory Cream:  Diclofenac 3%, Baclofen 2%, Cyclobenzaprine 2%, Lidocaine 2% dispense 120gram apply 1-2 grams to affected area 3-4 times daily +2refills. Faxed.

## 2015-06-01 NOTE — Progress Notes (Signed)
Subjective:     Patient ID: Karen Oconnell, female   DOB: [birth year], 49 y.o.   MRN: [id number]  HPI patient states she still having some discomfort but the pain is gradually improving and she is walking with a better gait pattern but without boot usage she still has pain   Review of Systems     Objective:   Physical Exam Neurovascular status intact mild dorsal edema noted within the tendon complex with no muscle strength loss or indication of tendon pathology. Moderate discomfort present    Assessment:     Tendinitis still present but improving    Plan:     Advised on physical therapy and we will begin topical anti-inflammatory cream and ice therapy and continued light immobilization with gradual reduction and reappoint to recheck

## 2015-06-04 ENCOUNTER — Other Ambulatory Visit: Payer: Self-pay

## 2015-06-15 ENCOUNTER — Encounter: Payer: Self-pay | Admitting: Family Medicine

## 2015-06-15 ENCOUNTER — Ambulatory Visit (INDEPENDENT_AMBULATORY_CARE_PROVIDER_SITE_OTHER): Payer: No Typology Code available for payment source | Admitting: Family Medicine

## 2015-06-15 VITALS — BP 119/83 | HR 86 | Temp 99.1°F | Resp 20 | Wt 130.5 lb

## 2015-06-15 DIAGNOSIS — J0100 Acute maxillary sinusitis, unspecified: Secondary | ICD-10-CM | POA: Diagnosis not present

## 2015-06-15 DIAGNOSIS — J320 Chronic maxillary sinusitis: Secondary | ICD-10-CM | POA: Insufficient documentation

## 2015-06-15 MED ORDER — NEOMYCIN-POLYMYXIN-HC 3.5-10000-1 OP SUSP: 3.0000 [drp] | Freq: Four times a day (QID) | OPHTHALMIC | Status: DC

## 2015-06-15 MED ORDER — AMOXICILLIN-POT CLAVULANATE 875-125 MG PO TABS: 1.0000 | ORAL_TABLET | Freq: Two times a day (BID) | ORAL | Status: DC

## 2015-06-15 NOTE — Progress Notes (Signed)
Subjective:    Patient ID: Karen Oconnell, female    DOB: 10/19/1965, 49 y.o.   MRN: [id number]  HPI   Cough: Pt presents to an acute office visit with complaints of sinus pressure, cough and left eye discomfort. Her symptoms started 1 week ago. She endorses cough and sinus green drainage, fever of 100F, red swollen matted shut eye (started this morning), nasal congestion, rhinorrhea and headache . She denies Nausea, Vomit, Diarrhea or Rash. She is tolerating PO.She works in a daycare. Husband is ill also with a sinus infection and has been prescribed abx. She has been taking mucinex DM and afrin for comfort.  She does wears contacts, but has not been wearing since the start of her symptoms 1 week ago, they are disposable. She does not have diabetes.   Past Medical History  Diagnosis Date  . Depression   . Neck pain, musculoskeletal 02/06/2011  . Anxiety   . Gestational diabetes   . HSV infection   . Stress fracture 2008    left ankle  . Seasonal allergic rhinitis   . SINUSITIS, RECURRENT 07/05/2010    Qualifier: Diagnosis of  By: Charlett Blake MD, Erline Levine     No Known Allergies Past Surgical History  Procedure Laterality Date  . Cesarean section  1997    X 1  . Eye surgery  age 89    right eye for strabismus  . Cyst removed from back of right leg  6-12  . Endometrial ablation  2010    HER OPTION ABLATION FOR MENORRHAGIA  . Bunionectomy  08/2013    right   Family History  Problem Relation Age of Onset  . Diabetes Mother     Type 2  . Hypertension Mother   . Hyperlipidemia Mother   . Depression Mother   . Seasonal affective disorder Father   . Cancer Father     MELANOMA  . Bipolar disorder Brother   . Drug abuse Brother     cocaine, vicodin, pain killers  . ADD / ADHD Son   . Depression Son   . Cancer Maternal Grandmother     gyn possibly cervical or uterine  . Diabetes Maternal Grandfather     Type 2  . Hypertension Maternal Grandfather   . Depression Maternal Grandfather    . Alcohol abuse Paternal Grandfather    Social History   Social History  . Marital Status: Married    Spouse Name: N/A  . Number of Children: N/A  . Years of Education: N/A   Occupational History  . Not on file.   Social History Main Topics  . Smoking status: Never Smoker   . Smokeless tobacco: Never Used  . Alcohol Use: 0.0 oz/week    0 drink(s) per week     Comment: occasionaly  . Drug Use: No  . Sexual Activity:    Partners: Male    Birth Control/ Protection: Other-see comments     Comment: husband vasectomy   Other Topics Concern  . 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   No T/A/Ds.     Review of Systems Negative, with the exception of above mentioned in HPI     Objective:   Physical Exam BP 119/83 mmHg  Pulse 86  Temp(Src) 99.1 F (37.3 C) (Oral)  Resp 20  Wt 130 lb 8 oz (59.194 kg)  SpO2 96%  LMP 06/13/2015 Gen: Afebrile. No acute distress.  Nontoxic in appearance, fatigued appearing. Pleasant female.  HENT: AT. [place]. Bilateral TM visualized shiny/full/pink, no erythema or bulging of membrane. MMM, no oral lesions. Bilateral nares with severe erythema, and swelling. Throat without erythema or exudates. Cough and hoarseness present on exam. Puffy/swollen eyes.  Eyes:Pupils Equal Round Reactive to light, Extraocular movements intact,  Conjunctiva without redness, discharge or icterus. Puffy/swollen eyes L>R.  Neck/lymp/endocrine: Supple,submandibular  Lymphadenopathy L>r CV: RRR  Chest: CTAB, no wheeze or crackles. Good air movement, normal resp effort.  Abd: Soft. NTND. BS present Skin: No rashes, purpura or petechiae.      Assessment & Plan:  1. Acute maxillary sinusitis, recurrence not specified - maxillary sinusitis, do not believe left eye is infectious. Will treat with augmentin, flonase, mucinex, nasal saline. Cortisporin to left eye, in the event it is an early signs of  conjunctivitis. No contact lenses until symptoms resolve.  - amoxicillin-clavulanate (AUGMENTIN) 875-125 MG tablet; Take 1 tablet by mouth 2 (two) times daily.  Dispense: 20 tablet; Refill: 0 - neomycin-polymyxin-hydrocortisone (CORTISPORIN) 3.5-10000-1 ophthalmic suspension; Place 3 drops into the left eye 4 (four) times daily.  Dispense: 7.5 mL; Refill: 0 - work excuse provided - F/U as needed

## 2015-06-15 NOTE — Patient Instructions (Addendum)
It was a pleasure meeting you today.  I believe you do have a sinus infection, and I have called in Augmentin and an eye drop (to be cautious). No contact lenses wearing. Pick up some mucinex (plain), use nasal saline a few times a day and flonase for a few weeks. You will need to get rest, remained well hydrated, wash your hands frequently. Follow up in 1-2 weeks if worsening symptoms.

## 2015-06-18 ENCOUNTER — Other Ambulatory Visit: Payer: Self-pay

## 2015-06-18 DIAGNOSIS — F411 Generalized anxiety disorder: Secondary | ICD-10-CM

## 2015-06-22 ENCOUNTER — Telehealth: Payer: Self-pay | Admitting: *Deleted

## 2015-06-22 NOTE — Telephone Encounter (Signed)
She should only have two more days of medication left. I would not start over with a new medication if she can tolerate it for a few more days. Make certain she is not taking on an empty stomach.  - If she reports she can not tolerate, we can call ina zpack for her.

## 2015-06-22 NOTE — Telephone Encounter (Signed)
Patient called and left message stating antibiotic is giving her stomach upset she wants to know if there is something else she can take instead > Patient was given Rx back on 06/15/15. Please advise

## 2015-06-23 NOTE — Telephone Encounter (Signed)
Spoke with patient she states the augmentin gave her severe diarrhea so she stopped it. She states she feels she is much better and doesn't need another antibiotic at this time. Added this information to patient allergy list as an intolerence.

## 2015-07-01 ENCOUNTER — Encounter: Payer: Self-pay | Admitting: Women's Health

## 2015-07-01 ENCOUNTER — Ambulatory Visit (INDEPENDENT_AMBULATORY_CARE_PROVIDER_SITE_OTHER): Payer: No Typology Code available for payment source | Admitting: Women's Health

## 2015-07-01 VITALS — BP 118/80 | Ht 63.0 in | Wt 133.0 lb

## 2015-07-01 DIAGNOSIS — Z01419 Encounter for gynecological examination (general) (routine) without abnormal findings: Secondary | ICD-10-CM | POA: Diagnosis not present

## 2015-07-01 NOTE — Patient Instructions (Signed)
Health Maintenance, Female Adopting a healthy lifestyle and getting preventive care can go a long way to promote health and wellness. Talk with your health care provider about what schedule of regular examinations is right for you. This is a good chance for you to check in with your provider about disease prevention and staying healthy. In between checkups, there are plenty of things you can do on your own. Experts have done a lot of research about which lifestyle changes and preventive measures are most likely to keep you healthy. Ask your health care provider for more information. WEIGHT AND DIET  Eat a healthy diet  Be sure to include plenty of vegetables, fruits, low-fat dairy products, and lean protein.  Do not eat a lot of foods high in solid fats, added sugars, or salt.  Get regular exercise. This is one of the most important things you can do for your health.  Most adults should exercise for at least 150 minutes each week. The exercise should increase your heart rate and make you sweat (moderate-intensity exercise).  Most adults should also do strengthening exercises at least twice a week. This is in addition to the moderate-intensity exercise.  Maintain a healthy weight  Body mass index (BMI) is a measurement that can be used to identify possible weight problems. It estimates body fat based on height and weight. Your health care provider can help determine your BMI and help you achieve or maintain a healthy weight.  For females 20 years of age and older:   A BMI below 18.5 is considered underweight.  A BMI of 18.5 to 24.9 is normal.  A BMI of 25 to 29.9 is considered overweight.  A BMI of 30 and above is considered obese.  Watch levels of cholesterol and blood lipids  You should start having your blood tested for lipids and cholesterol at 49 years of age, then have this test every 5 years.  You may need to have your cholesterol levels checked more often if:  Your lipid  or cholesterol levels are high.  You are older than 50 years of age.  You are at high risk for heart disease.  CANCER SCREENING   Lung Cancer  Lung cancer screening is recommended for adults 55-80 years old who are at high risk for lung cancer because of a history of smoking.  A yearly low-dose CT scan of the lungs is recommended for people who:  Currently smoke.  Have quit within the past 15 years.  Have at least a 30-pack-year history of smoking. A pack year is smoking an average of one pack of cigarettes a day for 1 year.  Yearly screening should continue until it has been 15 years since you quit.  Yearly screening should stop if you develop a health problem that would prevent you from having lung cancer treatment.  Breast Cancer  Practice breast self-awareness. This means understanding how your breasts normally appear and feel.  It also means doing regular breast self-exams. Let your health care provider know about any changes, no matter how small.  If you are in your 20s or 30s, you should have a clinical breast exam (CBE) by a health care provider every 1-3 years as part of a regular health exam.  If you are 40 or older, have a CBE every year. Also consider having a breast X-ray (mammogram) every year.  If you have a family history of breast cancer, talk to your health care provider about genetic screening.  If you   are at high risk for breast cancer, talk to your health care provider about having an MRI and a mammogram every year.  Breast cancer gene (BRCA) assessment is recommended for women who have family members with BRCA-related cancers. BRCA-related cancers include:  Breast.  Ovarian.  Tubal.  Peritoneal cancers.  Results of the assessment will determine the need for genetic counseling and BRCA1 and BRCA2 testing. Cervical Cancer Your health care provider may recommend that you be screened regularly for cancer of the pelvic organs (ovaries, uterus, and  vagina). This screening involves a pelvic examination, including checking for microscopic changes to the surface of your cervix (Pap test). You may be encouraged to have this screening done every 3 years, beginning at age 21.  For women ages 30-65, health care providers may recommend pelvic exams and Pap testing every 3 years, or they may recommend the Pap and pelvic exam, combined with testing for human papilloma virus (HPV), every 5 years. Some types of HPV increase your risk of cervical cancer. Testing for HPV may also be done on women of any age with unclear Pap test results.  Other health care providers may not recommend any screening for nonpregnant women who are considered low risk for pelvic cancer and who do not have symptoms. Ask your health care provider if a screening pelvic exam is right for you.  If you have had past treatment for cervical cancer or a condition that could lead to cancer, you need Pap tests and screening for cancer for at least 20 years after your treatment. If Pap tests have been discontinued, your risk factors (such as having a new sexual partner) need to be reassessed to determine if screening should resume. Some women have medical problems that increase the chance of getting cervical cancer. In these cases, your health care provider may recommend more frequent screening and Pap tests. Colorectal Cancer  This type of cancer can be detected and often prevented.  Routine colorectal cancer screening usually begins at 50 years of age and continues through 49 years of age.  Your health care provider may recommend screening at an earlier age if you have risk factors for colon cancer.  Your health care provider may also recommend using home test kits to check for hidden blood in the stool.  A small camera at the end of a tube can be used to examine your colon directly (sigmoidoscopy or colonoscopy). This is done to check for the earliest forms of colorectal  cancer.  Routine screening usually begins at age 50.  Direct examination of the colon should be repeated every 5-10 years through 49 years of age. However, you may need to be screened more often if early forms of precancerous polyps or small growths are found. Skin Cancer  Check your skin from head to toe regularly.  Tell your health care provider about any new moles or changes in moles, especially if there is a change in a mole's shape or color.  Also tell your health care provider if you have a mole that is larger than the size of a pencil eraser.  Always use sunscreen. Apply sunscreen liberally and repeatedly throughout the day.  Protect yourself by wearing long sleeves, pants, a wide-brimmed hat, and sunglasses whenever you are outside. HEART DISEASE, DIABETES, AND HIGH BLOOD PRESSURE   High blood pressure causes heart disease and increases the risk of stroke. High blood pressure is more likely to develop in:  People who have blood pressure in the high end   of the normal range (130-139/85-89 mm Hg).  People who are overweight or obese.  People who are African American.  If you are 38-23 years of age, have your blood pressure checked every 3-5 years. If you are 61 years of age or older, have your blood pressure checked every year. You should have your blood pressure measured twice--once when you are at a hospital or clinic, and once when you are not at a hospital or clinic. Record the average of the two measurements. To check your blood pressure when you are not at a hospital or clinic, you can use:  An automated blood pressure machine at a pharmacy.  A home blood pressure monitor.  If you are between 45 years and 39 years old, ask your health care provider if you should take aspirin to prevent strokes.  Have regular diabetes screenings. This involves taking a blood sample to check your fasting blood sugar level.  If you are at a normal weight and have a low risk for diabetes,  have this test once every three years after 49 years of age.  If you are overweight and have a high risk for diabetes, consider being tested at a younger age or more often. PREVENTING INFECTION  Hepatitis B  If you have a higher risk for hepatitis B, you should be screened for this virus. You are considered at high risk for hepatitis B if:  You were born in a country where hepatitis B is common. Ask your health care provider which countries are considered high risk.  Your parents were born in a high-risk country, and you have not been immunized against hepatitis B (hepatitis B vaccine).  You have HIV or AIDS.  You use needles to inject street drugs.  You live with someone who has hepatitis B.  You have had sex with someone who has hepatitis B.  You get hemodialysis treatment.  You take certain medicines for conditions, including cancer, organ transplantation, and autoimmune conditions. Hepatitis C  Blood testing is recommended for:  Everyone born from [birth year] through 1965.  Anyone with known risk factors for hepatitis C. Sexually transmitted infections (STIs)  You should be screened for sexually transmitted infections (STIs) including gonorrhea and chlamydia if:  You are sexually active and are younger than 49 years of age.  You are older than 49 years of age and your health care provider tells you that you are at risk for this type of infection.  Your sexual activity has changed since you were last screened and you are at an increased risk for chlamydia or gonorrhea. Ask your health care provider if you are at risk.  If you do not have HIV, but are at risk, it may be recommended that you take a prescription medicine daily to prevent HIV infection. This is called pre-exposure prophylaxis (PrEP). You are considered at risk if:  You are sexually active and do not regularly use condoms or know the HIV status of your partner(s).  You take drugs by injection.  You are sexually  active with a partner who has HIV. Talk with your health care provider about whether you are at high risk of being infected with HIV. If you choose to begin PrEP, you should first be tested for HIV. You should then be tested every 3 months for as long as you are taking PrEP.  PREGNANCY   If you are premenopausal and you may become pregnant, ask your health care provider about preconception counseling.  If you may  become pregnant, take 400 to 800 micrograms (mcg) of folic acid every day.  If you want to prevent pregnancy, talk to your health care provider about birth control (contraception). OSTEOPOROSIS AND MENOPAUSE   Osteoporosis is a disease in which the bones lose minerals and strength with aging. This can result in serious bone fractures. Your risk for osteoporosis can be identified using a bone density scan.  If you are 61 years of age or older, or if you are at risk for osteoporosis and fractures, ask your health care provider if you should be screened.  Ask your health care provider whether you should take a calcium or vitamin D supplement to lower your risk for osteoporosis.  Menopause may have certain physical symptoms and risks.  Hormone replacement therapy may reduce some of these symptoms and risks. Talk to your health care provider about whether hormone replacement therapy is right for you.  HOME CARE INSTRUCTIONS   Schedule regular health, dental, and eye exams.  Stay current with your immunizations.   Do not use any tobacco products including cigarettes, chewing tobacco, or electronic cigarettes.  If you are pregnant, do not drink alcohol.  If you are breastfeeding, limit how much and how often you drink alcohol.  Limit alcohol intake to no more than 1 drink per day for nonpregnant women. One drink equals 12 ounces of beer, 5 ounces of wine, or 1 ounces of hard liquor.  Do not use street drugs.  Do not share needles.  Ask your health care provider for help if  you need support or information about quitting drugs.  Tell your health care provider if you often feel depressed.  Tell your health care provider if you have ever been abused or do not feel safe at home.   This information is not intended to replace advice given to you by your health care provider. Make sure you discuss any questions you have with your health care provider.   Document Released: 01/02/2011 Document Revised: 07/10/2014 Document Reviewed: 05/21/2013 Elsevier Interactive Patient Education [employer].

## 2015-07-01 NOTE — Progress Notes (Signed)
Karen Oconnell 02-10-1966 [id number]    History:    Presents for annual exam.  Monthly cycles/vasectomy. Normal Pap and mammogram history. 2013 negative breast biopsy. History of gestational diabetes. Anxiety/depression on Zoloft and counseling. Primary care manages labs and meds.  Past medical history, past surgical history, family history and social history were all reviewed and documented in the EPIC chart. Works at a preschool. Son struggles with depression. Daughter 51 doing well at [employer]. Mother diabetes, hypercholesterolemia, hypertension.  ROS:  A ROS was performed and pertinent positives and negatives are included.  Exam:  Filed Vitals:   07/01/15 0928  BP: 118/80    General appearance:  Normal Thyroid:  Symmetrical, normal in size, without palpable masses or nodularity. Respiratory  Auscultation:  Clear without wheezing or rhonchi Cardiovascular  Auscultation:  Regular rate, without rubs, murmurs or gallops  Edema/varicosities:  Not grossly evident Abdominal  Soft,nontender, without masses, guarding or rebound.  Liver/spleen:  No organomegaly noted  Hernia:  None appreciated  Skin  Inspection:  Grossly normal   Breasts: Examined lying and sitting.     Right: Without masses, retractions, discharge or axillary adenopathy.     Left: Without masses, retractions, discharge or axillary adenopathy. Gentitourinary   Inguinal/mons:  Normal without inguinal adenopathy  External genitalia:  Normal  BUS/Urethra/Skene's glands:  Normal  Vagina:  Normal  Cervix:  Normal  Uterus:  normal in size, shape and contour.  Midline and mobile  Adnexa/parametria:     Rt: Without masses or tenderness.   Lt: Without masses or tenderness.  Anus and perineum: Normal  Digital rectal exam: Normal sphincter tone without palpated masses or tenderness  Assessment/Plan:  49 y.o. MWF G2 P2 for annual exam with no complaints.  Regular monthly cycles/vasectomy Anxiety/depression stable on  Zoloft Primary care manages labs and meds  Plan: SBE's, continue annual 3-D screening mammogram, calcium rich diet, vitamin D 1000 daily encouraged. Regular exercise, leisure activities encouraged. Pap normal 2015, new screening guidelines reviewed. UAHuel Cote WHNP, 10:16 AM 07/01/2015

## 2015-07-02 LAB — URINALYSIS W MICROSCOPIC + REFLEX CULTURE
Bacteria, UA: NONE SEEN [HPF]
Bilirubin Urine: NEGATIVE
Casts: NONE SEEN [LPF]
Crystals: NONE SEEN [HPF]
Glucose, UA: NEGATIVE
Hgb urine dipstick: NEGATIVE
Ketones, ur: NEGATIVE
Leukocytes, UA: NEGATIVE
Nitrite: NEGATIVE
Protein, ur: NEGATIVE
Specific Gravity, Urine: 1.027 (ref 1.001–1.035)
WBC, UA: NONE SEEN WBC/HPF (ref <=5)
Yeast: NONE SEEN [HPF]
pH: 6 (ref 5.0–8.0)

## 2015-07-03 LAB — URINE CULTURE: Colony Count: 70000

## 2015-09-09 ENCOUNTER — Other Ambulatory Visit: Payer: Self-pay

## 2015-09-09 DIAGNOSIS — F411 Generalized anxiety disorder: Secondary | ICD-10-CM

## 2015-09-09 MED ORDER — SERTRALINE HCL 100 MG PO TABS: ORAL_TABLET | ORAL | Status: DC

## 2015-09-27 ENCOUNTER — Other Ambulatory Visit: Payer: Self-pay

## 2015-09-27 DIAGNOSIS — Z1231 Encounter for screening mammogram for malignant neoplasm of breast: Secondary | ICD-10-CM

## 2015-09-30 ENCOUNTER — Other Ambulatory Visit: Payer: Self-pay | Admitting: *Deleted

## 2015-09-30 MED ORDER — ZOLPIDEM TARTRATE 10 MG PO TABS: 10.0000 mg | ORAL_TABLET | Freq: Every evening | ORAL | Status: DC | PRN

## 2015-09-30 NOTE — Telephone Encounter (Signed)
[facility] OR [phone]  RF request for Zolpidem LOV: 04/09/15 Next ov: 10/08/15 Last written: 04/06/15 #30 w/ 5RF  Please advise. Thanks.

## 2015-09-30 NOTE — Telephone Encounter (Signed)
Rx faxed

## 2015-10-08 ENCOUNTER — Ambulatory Visit (INDEPENDENT_AMBULATORY_CARE_PROVIDER_SITE_OTHER): Payer: No Typology Code available for payment source | Admitting: Family Medicine

## 2015-10-08 ENCOUNTER — Encounter: Payer: Self-pay | Admitting: Family Medicine

## 2015-10-08 VITALS — BP 110/76 | HR 63 | Temp 98.0°F | Resp 16 | Ht 63.25 in | Wt 126.2 lb

## 2015-10-08 DIAGNOSIS — Z0000 Encounter for general adult medical examination without abnormal findings: Secondary | ICD-10-CM | POA: Diagnosis not present

## 2015-10-08 DIAGNOSIS — Z1211 Encounter for screening for malignant neoplasm of colon: Secondary | ICD-10-CM

## 2015-10-08 LAB — COMPREHENSIVE METABOLIC PANEL
ALT: 11 U/L (ref 0–35)
AST: 15 U/L (ref 0–37)
Albumin: 4.5 g/dL (ref 3.5–5.2)
Alkaline Phosphatase: 43 U/L (ref 39–117)
BUN: 11 mg/dL (ref 6–23)
CO2: 28 mEq/L (ref 19–32)
Calcium: 9.5 mg/dL (ref 8.4–10.5)
Chloride: 102 mEq/L (ref 96–112)
Creatinine, Ser: 0.68 mg/dL (ref 0.40–1.20)
GFR: 97.38 mL/min (ref >60.00)
Glucose, Bld: 97 mg/dL (ref 70–99)
Potassium: 4.5 mEq/L (ref 3.5–5.1)
Sodium: 136 mEq/L (ref 135–145)
Total Bilirubin: 0.5 mg/dL (ref 0.2–1.2)
Total Protein: 6.8 g/dL (ref 6.0–8.3)

## 2015-10-08 LAB — LIPID PANEL
Cholesterol: 235 mg/dL — ABNORMAL HIGH (ref 0–200)
HDL: 85.3 mg/dL (ref >39.00)
LDL Cholesterol: 134 mg/dL — ABNORMAL HIGH (ref 0–99)
NonHDL: 149.54
Total CHOL/HDL Ratio: 3
Triglycerides: 77 mg/dL (ref 0.0–149.0)
VLDL: 15.4 mg/dL (ref 0.0–40.0)

## 2015-10-08 LAB — CBC WITH DIFFERENTIAL/PLATELET
Basophils Absolute: 0 10*3/uL (ref 0.0–0.1)
Basophils Relative: 0.8 % (ref 0.0–3.0)
Eosinophils Absolute: 0 10*3/uL (ref 0.0–0.7)
Eosinophils Relative: 0.5 % (ref 0.0–5.0)
HCT: 40.6 % (ref 36.0–46.0)
Hemoglobin: 13.6 g/dL (ref 12.0–15.0)
Lymphocytes Relative: 35 % (ref 12.0–46.0)
Lymphs Abs: 1.9 10*3/uL (ref 0.7–4.0)
MCHC: 33.7 g/dL (ref 30.0–36.0)
MCV: 86.8 fl (ref 78.0–100.0)
Monocytes Absolute: 0.3 10*3/uL (ref 0.1–1.0)
Monocytes Relative: 5.7 % (ref 3.0–12.0)
Neutro Abs: 3.2 10*3/uL (ref 1.4–7.7)
Neutrophils Relative %: 58 % (ref 43.0–77.0)
Platelets: 349 10*3/uL (ref 150.0–400.0)
RBC: 4.67 Mil/uL (ref 3.87–5.11)
RDW: 14.5 % (ref 11.5–15.5)
WBC: 5.5 10*3/uL (ref 4.0–10.5)

## 2015-10-08 LAB — TSH: TSH: 0.64 u[IU]/mL (ref 0.35–4.50)

## 2015-10-08 NOTE — Progress Notes (Signed)
Office Note 10/08/2015  CC:  Chief Complaint  Patient presents with  . Annual Exam    Pt is fasting.     HPI:  Karen Oconnell is a 50 y.o. female who is here for annual health maintenance exam. Not much exercise due to foot injury about 9 mo ago.    Diet is healthy. No acute complaints.   Past Medical History  Diagnosis Date  . Neck pain, musculoskeletal 02/06/2011  . Gestational diabetes   . HSV infection   . Stress fracture 2008    left ankle  . Seasonal allergic rhinitis     Past Surgical History  Procedure Laterality Date  . Cesarean section  1997    X 1  . Eye surgery  age 58    right eye for strabismus  . Cyst removed from back of right leg  6-12  . Endometrial ablation  2010    HER OPTION ABLATION FOR MENORRHAGIA  . Bunionectomy  08/2013    right    Family History  Problem Relation Age of Onset  . Diabetes Mother     Type 2  . Hypertension Mother   . Hyperlipidemia Mother   . Depression Mother   . Seasonal affective disorder Father   . Cancer Father     MELANOMA  . Bipolar disorder Brother   . Drug abuse Brother     cocaine, vicodin, pain killers  . ADD / ADHD Son   . Depression Son   . Cancer Maternal Grandmother     gyn possibly cervical or uterine  . Diabetes Maternal Grandfather     Type 2  . Hypertension Maternal Grandfather   . Depression Maternal Grandfather   . Alcohol abuse Paternal Grandfather     Social History   Social History  . Marital Status: Married    Spouse Name: N/A  . Number of Children: N/A  . Years of Education: N/A   Occupational History  . Not on file.   Social History Main Topics  . Smoking status: Never Smoker   . Smokeless tobacco: Never Used  . Alcohol Use: 0.0 oz/week    0 drink(s) per week     Comment: occasionaly  . Drug Use: No  . Sexual Activity:    Partners: Male    Birth Control/ Protection: Other-see comments     Comment: husband vasectomy   Other Topics Concern  . 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   No T/A/Ds.    Outpatient Prescriptions Prior to Visit  Medication Sig Dispense Refill  . ALPRAZolam (XANAX) 0.25 MG tablet TAKE 1 TABLET BY MOUTH DAILY AS NEEDED FOR ANXIETY 30 tablet 5  . ibuprofen (ADVIL,MOTRIN) 200 MG tablet Take 600 mg by mouth every 6 (six) hours as needed.      . sertraline (ZOLOFT) 100 MG tablet Take 150mg  daily 60 tablet 12  . zolpidem (AMBIEN) 10 MG tablet Take 1 tablet (10 mg total) by mouth at bedtime as needed. 30 tablet 5  . neomycin-polymyxin-hydrocortisone (CORTISPORIN) 3.5-10000-1 ophthalmic suspension Place 3 drops into the left eye 4 (four) times daily. (Patient not taking: Reported on 10/08/2015) 7.5 mL 0   No facility-administered medications prior to visit.    Allergies  Allergen Reactions  . Augmentin [Amoxicillin-Pot Clavulanate] Diarrhea    ROS Review of Systems  Constitutional: Negative for fever, chills, appetite change and fatigue.  HENT: Negative for congestion, dental  problem, ear pain and sore throat.   Eyes: Negative for discharge, redness and visual disturbance.  Respiratory: Negative for cough, chest tightness, shortness of breath and wheezing.   Cardiovascular: Negative for chest pain, palpitations and leg swelling.  Gastrointestinal: Negative for nausea, vomiting, abdominal pain, diarrhea and blood in stool.  Genitourinary: Negative for dysuria, urgency, frequency, hematuria, flank pain and difficulty urinating.  Musculoskeletal: Negative for myalgias, back pain, joint swelling, arthralgias and neck stiffness.  Skin: Negative for pallor and rash.  Neurological: Negative for dizziness, speech difficulty, weakness and headaches.  Hematological: Negative for adenopathy. Does not bruise/bleed easily.  Psychiatric/Behavioral: Negative for confusion and sleep disturbance. The patient is not nervous/anxious.     PE; Blood pressure 110/76,  pulse 63, temperature 98 F (36.7 C), temperature source Oral, resp. rate 16, height 5' 3.25" (1.607 m), weight 126 lb 4 oz (57.267 kg), last menstrual period 09/27/2015, SpO2 94 %.  Pt examined with Sharen Hones, CMA, as chaperone.  Gen: Alert, well appearing.  Patient is oriented to person, place, time, and situation. AFFECT: pleasant, lucid thought and speech. ENT: Ears: EACs clear, normal epithelium.  TMs with good light reflex and landmarks bilaterally.  Eyes: no injection, icteris, swelling, or exudate.  EOMI, PERRLA. Nose: no drainage or turbinate edema/swelling.  No injection or focal lesion.  Mouth: lips without lesion/swelling.  Oral mucosa pink and moist.  Dentition intact and without obvious caries or gingival swelling.  Oropharynx without erythema, exudate, or swelling.  Neck: supple/nontender.  No LAD, mass, or TM.  Carotid pulses 2+ bilaterally, without bruits. CV: RRR, no m/r/g.   LUNGS: CTA bilat, nonlabored resps, good aeration in all lung fields. ABD: soft, NT, ND, BS normal.  No hepatospenomegaly or mass.  No bruits. EXT: no clubbing, cyanosis, or edema.  Musculoskeletal: no joint swelling, erythema, warmth, or tenderness.  ROM of all joints intact. Skin - no sores or suspicious lesions or rashes or color changes  Pertinent labs:  Lab Results  Component Value Date   TSH 0.48 04/09/2014   Lab Results  Component Value Date   WBC 5.1 04/09/2014   HGB 13.6 04/09/2014   HCT 41.3 04/09/2014   MCV 90.0 04/09/2014   PLT 322.0 04/09/2014   Lab Results  Component Value Date   CREATININE 0.7 04/09/2014   BUN 13 04/09/2014   NA 139 04/09/2014   K 4.1 04/09/2014   CL 105 04/09/2014   CO2 28 04/09/2014   Lab Results  Component Value Date   ALT 16 04/09/2014   AST 21 04/09/2014   ALKPHOS 40 04/09/2014   BILITOT 0.6 04/09/2014   Lab Results  Component Value Date   CHOL 235* 04/09/2014   Lab Results  Component Value Date   HDL 87.70 04/09/2014   Lab Results   Component Value Date   LDLCALC 135* 04/09/2014   Lab Results  Component Value Date   TRIG 61.0 04/09/2014   Lab Results  Component Value Date   CHOLHDL 3 04/09/2014    ASSESSMENT AND PLAN:   Health maintenance exam: Reviewed age and gender appropriate health maintenance issues (prudent diet, regular exercise, health risks of tobacco and excessive alcohol, use of seatbelts, fire alarms in home, use of sunscreen).  Also reviewed age and gender appropriate health screening as well as vaccine recommendations. Vaccines UTD. Fasting HP labs drawn today. Colon ca screening: ordered GI referral for this (to be scheduled after she turns 50 next month). She gets cervical ca and breast ca screening  via her GYN provider.  An After Visit Summary was printed and given to the patient.   FOLLOW UP:  Return in about 6 months (around 04/08/2016) for routine chronic illness f/u.  Signed:  Crissie Sickles, MD           10/08/2015

## 2015-10-08 NOTE — Patient Instructions (Signed)
Health Maintenance, Female Adopting a healthy lifestyle and getting preventive care can go a long way to promote health and wellness. Talk with your health care provider about what schedule of regular examinations is right for you. This is a good chance for you to check in with your provider about disease prevention and staying healthy. In between checkups, there are plenty of things you can do on your own. Experts have done a lot of research about which lifestyle changes and preventive measures are most likely to keep you healthy. Ask your health care provider for more information. WEIGHT AND DIET  Eat a healthy diet  Be sure to include plenty of vegetables, fruits, low-fat dairy products, and lean protein.  Do not eat a lot of foods high in solid fats, added sugars, or salt.  Get regular exercise. This is one of the most important things you can do for your health.  Most adults should exercise for at least 150 minutes each week. The exercise should increase your heart rate and make you sweat (moderate-intensity exercise).  Most adults should also do strengthening exercises at least twice a week. This is in addition to the moderate-intensity exercise.  Maintain a healthy weight  Body mass index (BMI) is a measurement that can be used to identify possible weight problems. It estimates body fat based on height and weight. Your health care provider can help determine your BMI and help you achieve or maintain a healthy weight.  For females 20 years of age and older:   A BMI below 18.5 is considered underweight.  A BMI of 18.5 to 24.9 is normal.  A BMI of 25 to 29.9 is considered overweight.  A BMI of 30 and above is considered obese.  Watch levels of cholesterol and blood lipids  You should start having your blood tested for lipids and cholesterol at 50 years of age, then have this test every 5 years.  You may need to have your cholesterol levels checked more often if:  Your lipid  or cholesterol levels are high.  You are older than 50 years of age.  You are at high risk for heart disease.  CANCER SCREENING   Lung Cancer  Lung cancer screening is recommended for adults 55-80 years old who are at high risk for lung cancer because of a history of smoking.  A yearly low-dose CT scan of the lungs is recommended for people who:  Currently smoke.  Have quit within the past 15 years.  Have at least a 30-pack-year history of smoking. A pack year is smoking an average of one pack of cigarettes a day for 1 year.  Yearly screening should continue until it has been 15 years since you quit.  Yearly screening should stop if you develop a health problem that would prevent you from having lung cancer treatment.  Breast Cancer  Practice breast self-awareness. This means understanding how your breasts normally appear and feel.  It also means doing regular breast self-exams. Let your health care provider know about any changes, no matter how small.  If you are in your 20s or 30s, you should have a clinical breast exam (CBE) by a health care provider every 1-3 years as part of a regular health exam.  If you are 40 or older, have a CBE every year. Also consider having a breast X-ray (mammogram) every year.  If you have a family history of breast cancer, talk to your health care provider about genetic screening.  If you   are at high risk for breast cancer, talk to your health care provider about having an MRI and a mammogram every year.  Breast cancer gene (BRCA) assessment is recommended for women who have family members with BRCA-related cancers. BRCA-related cancers include:  Breast.  Ovarian.  Tubal.  Peritoneal cancers.  Results of the assessment will determine the need for genetic counseling and BRCA1 and BRCA2 testing. Cervical Cancer Your health care provider may recommend that you be screened regularly for cancer of the pelvic organs (ovaries, uterus, and  vagina). This screening involves a pelvic examination, including checking for microscopic changes to the surface of your cervix (Pap test). You may be encouraged to have this screening done every 3 years, beginning at age 21.  For women ages 30-65, health care providers may recommend pelvic exams and Pap testing every 3 years, or they may recommend the Pap and pelvic exam, combined with testing for human papilloma virus (HPV), every 5 years. Some types of HPV increase your risk of cervical cancer. Testing for HPV may also be done on women of any age with unclear Pap test results.  Other health care providers may not recommend any screening for nonpregnant women who are considered low risk for pelvic cancer and who do not have symptoms. Ask your health care provider if a screening pelvic exam is right for you.  If you have had past treatment for cervical cancer or a condition that could lead to cancer, you need Pap tests and screening for cancer for at least 20 years after your treatment. If Pap tests have been discontinued, your risk factors (such as having a new sexual partner) need to be reassessed to determine if screening should resume. Some women have medical problems that increase the chance of getting cervical cancer. In these cases, your health care provider may recommend more frequent screening and Pap tests. Colorectal Cancer  This type of cancer can be detected and often prevented.  Routine colorectal cancer screening usually begins at 50 years of age and continues through 50 years of age.  Your health care provider may recommend screening at an earlier age if you have risk factors for colon cancer.  Your health care provider may also recommend using home test kits to check for hidden blood in the stool.  A small camera at the end of a tube can be used to examine your colon directly (sigmoidoscopy or colonoscopy). This is done to check for the earliest forms of colorectal  cancer.  Routine screening usually begins at age 50.  Direct examination of the colon should be repeated every 5-10 years through 50 years of age. However, you may need to be screened more often if early forms of precancerous polyps or small growths are found. Skin Cancer  Check your skin from head to toe regularly.  Tell your health care provider about any new moles or changes in moles, especially if there is a change in a mole's shape or color.  Also tell your health care provider if you have a mole that is larger than the size of a pencil eraser.  Always use sunscreen. Apply sunscreen liberally and repeatedly throughout the day.  Protect yourself by wearing long sleeves, pants, a wide-brimmed hat, and sunglasses whenever you are outside. HEART DISEASE, DIABETES, AND HIGH BLOOD PRESSURE   High blood pressure causes heart disease and increases the risk of stroke. High blood pressure is more likely to develop in:  People who have blood pressure in the high end   of the normal range (130-139/85-89 mm Hg).  People who are overweight or obese.  People who are African American.  If you are 38-23 years of age, have your blood pressure checked every 3-5 years. If you are 61 years of age or older, have your blood pressure checked every year. You should have your blood pressure measured twice--once when you are at a hospital or clinic, and once when you are not at a hospital or clinic. Record the average of the two measurements. To check your blood pressure when you are not at a hospital or clinic, you can use:  An automated blood pressure machine at a pharmacy.  A home blood pressure monitor.  If you are between 45 years and 39 years old, ask your health care provider if you should take aspirin to prevent strokes.  Have regular diabetes screenings. This involves taking a blood sample to check your fasting blood sugar level.  If you are at a normal weight and have a low risk for diabetes,  have this test once every three years after 50 years of age.  If you are overweight and have a high risk for diabetes, consider being tested at a younger age or more often. PREVENTING INFECTION  Hepatitis B  If you have a higher risk for hepatitis B, you should be screened for this virus. You are considered at high risk for hepatitis B if:  You were born in a country where hepatitis B is common. Ask your health care provider which countries are considered high risk.  Your parents were born in a high-risk country, and you have not been immunized against hepatitis B (hepatitis B vaccine).  You have HIV or AIDS.  You use needles to inject street drugs.  You live with someone who has hepatitis B.  You have had sex with someone who has hepatitis B.  You get hemodialysis treatment.  You take certain medicines for conditions, including cancer, organ transplantation, and autoimmune conditions. Hepatitis C  Blood testing is recommended for:  Everyone born from [birth year] through 1965.  Anyone with known risk factors for hepatitis C. Sexually transmitted infections (STIs)  You should be screened for sexually transmitted infections (STIs) including gonorrhea and chlamydia if:  You are sexually active and are younger than 50 years of age.  You are older than 50 years of age and your health care provider tells you that you are at risk for this type of infection.  Your sexual activity has changed since you were last screened and you are at an increased risk for chlamydia or gonorrhea. Ask your health care provider if you are at risk.  If you do not have HIV, but are at risk, it may be recommended that you take a prescription medicine daily to prevent HIV infection. This is called pre-exposure prophylaxis (PrEP). You are considered at risk if:  You are sexually active and do not regularly use condoms or know the HIV status of your partner(s).  You take drugs by injection.  You are sexually  active with a partner who has HIV. Talk with your health care provider about whether you are at high risk of being infected with HIV. If you choose to begin PrEP, you should first be tested for HIV. You should then be tested every 3 months for as long as you are taking PrEP.  PREGNANCY   If you are premenopausal and you may become pregnant, ask your health care provider about preconception counseling.  If you may  become pregnant, take 400 to 800 micrograms (mcg) of folic acid every day.  If you want to prevent pregnancy, talk to your health care provider about birth control (contraception). OSTEOPOROSIS AND MENOPAUSE   Osteoporosis is a disease in which the bones lose minerals and strength with aging. This can result in serious bone fractures. Your risk for osteoporosis can be identified using a bone density scan.  If you are 61 years of age or older, or if you are at risk for osteoporosis and fractures, ask your health care provider if you should be screened.  Ask your health care provider whether you should take a calcium or vitamin D supplement to lower your risk for osteoporosis.  Menopause may have certain physical symptoms and risks.  Hormone replacement therapy may reduce some of these symptoms and risks. Talk to your health care provider about whether hormone replacement therapy is right for you.  HOME CARE INSTRUCTIONS   Schedule regular health, dental, and eye exams.  Stay current with your immunizations.   Do not use any tobacco products including cigarettes, chewing tobacco, or electronic cigarettes.  If you are pregnant, do not drink alcohol.  If you are breastfeeding, limit how much and how often you drink alcohol.  Limit alcohol intake to no more than 1 drink per day for nonpregnant women. One drink equals 12 ounces of beer, 5 ounces of wine, or 1 ounces of hard liquor.  Do not use street drugs.  Do not share needles.  Ask your health care provider for help if  you need support or information about quitting drugs.  Tell your health care provider if you often feel depressed.  Tell your health care provider if you have ever been abused or do not feel safe at home.   This information is not intended to replace advice given to you by your health care provider. Make sure you discuss any questions you have with your health care provider.   Document Released: 01/02/2011 Document Revised: 07/10/2014 Document Reviewed: 05/21/2013 Elsevier Interactive Patient Education [employer].

## 2015-10-08 NOTE — Progress Notes (Signed)
Pre visit review using our clinic review tool, if applicable. No additional management support is needed unless otherwise documented below in the visit note. 

## 2015-10-18 ENCOUNTER — Ambulatory Visit
Admission: RE | Admit: 2015-10-18 | Discharge: 2015-10-18 | Disposition: A | Discharge status: Home / Self Care | Payer: No Typology Code available for payment source | Source: Ambulatory Visit

## 2015-10-18 DIAGNOSIS — Z1231 Encounter for screening mammogram for malignant neoplasm of breast: Secondary | ICD-10-CM | POA: Diagnosis not present

## 2015-10-19 ENCOUNTER — Other Ambulatory Visit: Payer: Self-pay | Admitting: Women's Health

## 2015-10-19 DIAGNOSIS — R928 Other abnormal and inconclusive findings on diagnostic imaging of breast: Secondary | ICD-10-CM

## 2015-11-03 ENCOUNTER — Ambulatory Visit
Admission: RE | Admit: 2015-11-03 | Discharge: 2015-11-03 | Disposition: A | Discharge status: Home / Self Care | Payer: BLUE CROSS/BLUE SHIELD | Source: Ambulatory Visit | Attending: Women's Health | Admitting: Women's Health

## 2015-11-03 ENCOUNTER — Encounter: Payer: Self-pay | Admitting: Women's Health

## 2015-11-03 DIAGNOSIS — N6489 Other specified disorders of breast: Secondary | ICD-10-CM | POA: Diagnosis not present

## 2015-11-03 DIAGNOSIS — R928 Other abnormal and inconclusive findings on diagnostic imaging of breast: Secondary | ICD-10-CM

## 2015-11-12 ENCOUNTER — Other Ambulatory Visit: Payer: Self-pay | Admitting: *Deleted

## 2015-11-12 DIAGNOSIS — F411 Generalized anxiety disorder: Secondary | ICD-10-CM

## 2015-11-12 MED ORDER — ALPRAZOLAM 0.25 MG PO TABS: ORAL_TABLET | ORAL | Status: DC

## 2015-11-12 NOTE — Telephone Encounter (Signed)
[facility] Fax #: [phone]  RF request for alprazolam LOV: 10/08/15 Next ov: 04/07/16 Last written: 04/09/15 #30 w/ 5RF  Please advise. Thanks.

## 2015-11-12 NOTE — Telephone Encounter (Signed)
Rx faxed

## 2015-11-17 DIAGNOSIS — M7752 Other enthesopathy of left foot: Secondary | ICD-10-CM | POA: Diagnosis not present

## 2015-11-17 DIAGNOSIS — M659 Synovitis and tenosynovitis, unspecified: Secondary | ICD-10-CM | POA: Diagnosis not present

## 2015-11-17 DIAGNOSIS — M19071 Primary osteoarthritis, right ankle and foot: Secondary | ICD-10-CM | POA: Diagnosis not present

## 2015-11-17 DIAGNOSIS — M19072 Primary osteoarthritis, left ankle and foot: Secondary | ICD-10-CM | POA: Diagnosis not present

## 2015-11-17 DIAGNOSIS — G5761 Lesion of plantar nerve, right lower limb: Secondary | ICD-10-CM | POA: Diagnosis not present

## 2015-11-24 DIAGNOSIS — G5761 Lesion of plantar nerve, right lower limb: Secondary | ICD-10-CM | POA: Diagnosis not present

## 2016-01-11 ENCOUNTER — Other Ambulatory Visit: Payer: Self-pay

## 2016-01-11 DIAGNOSIS — F411 Generalized anxiety disorder: Secondary | ICD-10-CM

## 2016-01-11 MED ORDER — SERTRALINE HCL 100 MG PO TABS: ORAL_TABLET | ORAL | Status: DC

## 2016-03-07 ENCOUNTER — Encounter: Payer: Self-pay | Admitting: Family Medicine

## 2016-03-07 ENCOUNTER — Ambulatory Visit (INDEPENDENT_AMBULATORY_CARE_PROVIDER_SITE_OTHER): Payer: BLUE CROSS/BLUE SHIELD | Admitting: Family Medicine

## 2016-03-07 VITALS — BP 123/84 | HR 81 | Temp 98.5°F | Resp 16 | Ht 63.0 in | Wt 129.8 lb

## 2016-03-07 DIAGNOSIS — N3000 Acute cystitis without hematuria: Secondary | ICD-10-CM

## 2016-03-07 DIAGNOSIS — R350 Frequency of micturition: Secondary | ICD-10-CM | POA: Diagnosis not present

## 2016-03-07 LAB — POCT URINALYSIS DIPSTICK
Bilirubin, UA: NEGATIVE
Blood, UA: NEGATIVE
Glucose, UA: NEGATIVE
Ketones, UA: NEGATIVE
Leukocytes, UA: NEGATIVE
Nitrite, UA: NEGATIVE
Spec Grav, UA: 1.025
Urobilinogen, UA: 0.2
pH, UA: 6

## 2016-03-07 MED ORDER — SULFAMETHOXAZOLE-TRIMETHOPRIM 800-160 MG PO TABS: 1.0000 | ORAL_TABLET | Freq: Two times a day (BID) | ORAL | 0 refills | Status: DC

## 2016-03-07 NOTE — Progress Notes (Signed)
OFFICE VISIT  03/07/2016   CC:  Chief Complaint  Patient presents with  . Urinary Frequency    possible uti, pain back pain   HPI:    Patient is a 50 y.o. Caucasian female who presents for pain and pressure in lower abd/suprapubic as well as diffuse lower back pain and pressure and feeling of needing to pee all the time.  No urgency.  Slight dysuria, onset lately and getting worse. Initial sx's began a couple weeks ago.   No fever but some nausea/indigestion.  No vomiting. Took advil but no AZO.  No abnormal BMs.  + Vag d/c last couple days.  Past Medical History:  Diagnosis Date  . Gestational diabetes   . HSV infection   . Neck pain, musculoskeletal 02/06/2011  . Seasonal allergic rhinitis   . Stress fracture 2008   left ankle    Past Surgical History:  Procedure Laterality Date  . BUNIONECTOMY  08/2013   right  . [place]   X 1  . cyst removed from back of right leg  6-12  . ENDOMETRIAL ABLATION  2010   HER OPTION ABLATION FOR MENORRHAGIA  . EYE SURGERY  age 52   right eye for strabismus    Outpatient Medications Prior to Visit  Medication Sig Dispense Refill  . ALPRAZolam (XANAX) 0.25 MG tablet TAKE 1 TABLET BY MOUTH DAILY AS NEEDED FOR ANXIETY 30 tablet 5  . ibuprofen (ADVIL,MOTRIN) 200 MG tablet Take 600 mg by mouth every 6 (six) hours as needed.      . sertraline (ZOLOFT) 100 MG tablet Take 150mg  daily 90 tablet 1  . zolpidem (AMBIEN) 10 MG tablet Take 1 tablet (10 mg total) by mouth at bedtime as needed. 30 tablet 5   No facility-administered medications prior to visit.     No Known Allergies  ROS As per HPI  PE: Blood pressure 123/84, pulse 81, temperature 98.5 F (36.9 C), temperature source Oral, resp. rate 16, height 5\' 3"  (1.6 m), weight 129 lb 12.8 oz (58.9 kg), last menstrual period 02/22/2016, SpO2 96 %.  Exam chaperoned by CMA Mickel Baas. Gen: Alert, well appearing.  Patient is oriented to person, place, time, and situation. AFFECT:  pleasant, lucid thought and speech. ABD: soft, diffuse TTP, with more signif TTP in lower quadrants and suprapubic region.   No CVA tenderness.   LABS:  CC UA today: normal   IMPRESSION AND PLAN:  UTI suspected based on clinical picture, but UA normal. Will start bactrim DS 1 bid x 5d and send urine for c/s. Signs/symptoms to call or return for were reviewed and pt expressed understanding.  An After Visit Summary was printed and given to the patient.  FOLLOW UP: Return if symptoms worsen or fail to improve.  Signed:  Crissie Sickles, MD           03/07/2016

## 2016-03-08 LAB — URINE CULTURE: Organism ID, Bacteria: 10000

## 2016-03-14 ENCOUNTER — Encounter: Payer: Self-pay | Admitting: Family Medicine

## 2016-03-27 ENCOUNTER — Other Ambulatory Visit: Payer: Self-pay | Admitting: Family Medicine

## 2016-03-27 NOTE — Telephone Encounter (Signed)
Pt requesting RF of ambien. Last OV 03/07/16 No upcoming appt. Last RX printed 09/30/15 x 5 RFS. Please advise.

## 2016-03-28 MED ORDER — ZOLPIDEM TARTRATE 10 MG PO TABS: 10.0000 mg | ORAL_TABLET | Freq: Every evening | ORAL | 5 refills | Status: DC | PRN

## 2016-03-28 NOTE — Telephone Encounter (Signed)
RX faxed

## 2016-04-07 ENCOUNTER — Ambulatory Visit: Payer: No Typology Code available for payment source | Admitting: Family Medicine

## 2016-05-01 ENCOUNTER — Other Ambulatory Visit: Payer: Self-pay | Admitting: Women's Health

## 2016-05-01 DIAGNOSIS — N6489 Other specified disorders of breast: Secondary | ICD-10-CM

## 2016-05-03 DIAGNOSIS — M47816 Spondylosis without myelopathy or radiculopathy, lumbar region: Secondary | ICD-10-CM

## 2016-05-03 HISTORY — DX: Spondylosis without myelopathy or radiculopathy, lumbar region: M47.816

## 2016-05-11 ENCOUNTER — Encounter: Payer: Self-pay | Admitting: Women's Health

## 2016-05-11 ENCOUNTER — Ambulatory Visit
Admission: RE | Admit: 2016-05-11 | Discharge: 2016-05-11 | Disposition: A | Discharge status: Home / Self Care | Payer: BLUE CROSS/BLUE SHIELD | Source: Ambulatory Visit | Attending: Women's Health | Admitting: Women's Health

## 2016-05-11 DIAGNOSIS — N6489 Other specified disorders of breast: Secondary | ICD-10-CM

## 2016-05-11 DIAGNOSIS — R928 Other abnormal and inconclusive findings on diagnostic imaging of breast: Secondary | ICD-10-CM | POA: Diagnosis not present

## 2016-05-22 ENCOUNTER — Telehealth: Payer: Self-pay | Admitting: Family Medicine

## 2016-05-22 ENCOUNTER — Encounter: Payer: Self-pay | Admitting: Gastroenterology

## 2016-05-22 DIAGNOSIS — L820 Inflamed seborrheic keratosis: Secondary | ICD-10-CM | POA: Diagnosis not present

## 2016-05-22 NOTE — Telephone Encounter (Signed)
Pt advised and voiced understanding.  Apt made for 05/29/16 at 11:00am.

## 2016-05-22 NOTE — Telephone Encounter (Signed)
Can see me.

## 2016-05-22 NOTE — Telephone Encounter (Signed)
Please advise. Thanks.  

## 2016-05-22 NOTE — Telephone Encounter (Signed)
Patient states her back pain has flair up again.  She states she seen pcp for this problem during the summer of 2016.  She wants to know if she come in to see pcp again or if she should be referred to a specialist.

## 2016-05-29 ENCOUNTER — Ambulatory Visit: Payer: BLUE CROSS/BLUE SHIELD | Admitting: Family Medicine

## 2016-05-29 ENCOUNTER — Ambulatory Visit (INDEPENDENT_AMBULATORY_CARE_PROVIDER_SITE_OTHER): Payer: BLUE CROSS/BLUE SHIELD | Admitting: Family Medicine

## 2016-05-29 ENCOUNTER — Encounter: Payer: Self-pay | Admitting: Family Medicine

## 2016-05-29 VITALS — BP 108/76 | HR 79 | Temp 98.8°F | Resp 16 | Ht 63.0 in | Wt 135.0 lb

## 2016-05-29 DIAGNOSIS — M533 Sacrococcygeal disorders, not elsewhere classified: Secondary | ICD-10-CM

## 2016-05-29 DIAGNOSIS — M545 Low back pain, unspecified: Secondary | ICD-10-CM

## 2016-05-29 DIAGNOSIS — Z23 Encounter for immunization: Secondary | ICD-10-CM

## 2016-05-29 MED ORDER — TRAMADOL HCL 50 MG PO TABS: ORAL_TABLET | ORAL | 1 refills | Status: DC

## 2016-05-29 NOTE — Progress Notes (Signed)
OFFICE VISIT  05/29/2016   CC:  Chief Complaint  Patient presents with  . Back Pain    recurrent from fall in 2016, started x 1 month   HPI:    Patient is a 50 y.o. Caucasian female who presents for back pain.   Right lower back.  Hx of fall when tripped over dog in 2016.  Pt feels like it has been "bulging in this area" since then and has had intermittent periods of mild pain since then.  Has had no x-rays of back or done any therapy for her back. For the last month has noted it bothering her more intensely and constantly and gets worse with being up all day.  No radiation of the pain, no tingling or numbness down legs.  She has done home stretching, heat, ibuprofen/aleve.   She says she feels like her body is "misaligned" due to the problem with her R LB region lately.  ROS: no joint swelling or stiffness, no fevers, no rashes, no muscle aches.    Past Medical History:  Diagnosis Date  . Gestational diabetes   . HSV infection   . Neck pain, musculoskeletal 02/06/2011  . Seasonal allergic rhinitis   . Stress fracture 2008   left ankle    Past Surgical History:  Procedure Laterality Date  . BUNIONECTOMY  08/2013   right  . [place]   X 1  . cyst removed from back of right leg  6-12  . ENDOMETRIAL ABLATION  2010   HER OPTION ABLATION FOR MENORRHAGIA  . EYE SURGERY  age 39   right eye for strabismus    Outpatient Medications Prior to Visit  Medication Sig Dispense Refill  . ALPRAZolam (XANAX) 0.25 MG tablet TAKE 1 TABLET BY MOUTH DAILY AS NEEDED FOR ANXIETY 30 tablet 5  . ibuprofen (ADVIL,MOTRIN) 200 MG tablet Take 600 mg by mouth every 6 (six) hours as needed.      . sertraline (ZOLOFT) 100 MG tablet Take 150mg  daily 90 tablet 1  . zolpidem (AMBIEN) 10 MG tablet Take 1 tablet (10 mg total) by mouth at bedtime as needed. 30 tablet 5  . sulfamethoxazole-trimethoprim (BACTRIM DS,SEPTRA DS) 800-160 MG tablet Take 1 tablet by mouth 2 (two) times daily. (Patient  not taking: Reported on 05/29/2016) 10 tablet 0   No facility-administered medications prior to visit.     No Known Allergies  ROS As per HPI  PE: Blood pressure 108/76, pulse 79, temperature 98.8 F (37.1 C), temperature source Oral, resp. rate 16, height 5\' 3"  (1.6 m), weight 135 lb (61.2 kg), last menstrual period 05/13/2016, SpO2 96 %.  Pt examined with Starla Link, CMA, as chaperone. Gen: Alert, well appearing.  Patient is oriented to person, place, time, and situation. AFFECT: pleasant, lucid thought and speech. L spine straight, w/out deformity or ecchymoses. She has focal TTP over proximal R SI joint. She has neg sitting SLR bilat. Hip ROM: intact but flexion and ER elicits pain in L/S spine on R. LE strength 5/5 prox/dist bilat in LE's.  LABS:  none  IMPRESSION AND PLAN:  Right lumbosacral region pain, suspect SI joint dysfunction since her fall in 2016. Will obtain plain film of L/S spine. No more NSAIDs at this time. Tramadol 50mg , 1-2 bid prn pain, #30, RF x 1. Refer to Dr. Charlann Boxer in Sports Medicine for further evaluation and management.  An After Visit Summary was printed and given to the patient.  FOLLOW UP: Return  for as needed.  Signed:  Crissie Sickles, MD           05/29/2016

## 2016-05-29 NOTE — Progress Notes (Signed)
Pre visit review using our clinic review tool, if applicable. No additional management support is needed unless otherwise documented below in the visit note. 

## 2016-06-01 ENCOUNTER — Ambulatory Visit (INDEPENDENT_AMBULATORY_CARE_PROVIDER_SITE_OTHER)
Admission: RE | Admit: 2016-06-01 | Discharge: 2016-06-01 | Disposition: A | Discharge status: Home / Self Care | Payer: BLUE CROSS/BLUE SHIELD | Source: Ambulatory Visit | Attending: Family Medicine | Admitting: Family Medicine

## 2016-06-01 ENCOUNTER — Encounter: Payer: Self-pay | Admitting: Family Medicine

## 2016-06-01 DIAGNOSIS — M533 Sacrococcygeal disorders, not elsewhere classified: Secondary | ICD-10-CM

## 2016-06-01 DIAGNOSIS — M5136 Other intervertebral disc degeneration, lumbar region: Secondary | ICD-10-CM | POA: Diagnosis not present

## 2016-06-02 DIAGNOSIS — M533 Sacrococcygeal disorders, not elsewhere classified: Secondary | ICD-10-CM

## 2016-06-02 HISTORY — DX: Sacrococcygeal disorders, not elsewhere classified: M53.3

## 2016-06-11 NOTE — Progress Notes (Signed)
[facility] [address], [place] [address] Phone: [phone] Subjective:    I'm seeing this patient by the request  of:  MCGOWEN,PHILIP H, MD   CC: Low back pain  [id number]  Karen Oconnell is a 50 y.o. female coming in with complaint of low back pain. Patient did have an injury approximately 1 year ago and she tripped over her dog. States that since then has had intermittent pain that seemed to be mild but now seems to be worsening. Some primary care provider and was diagnosed with sacroiliac dysfunction. Here for further evaluation. States that unfortunately it seems to be getting more intense. Denies though any radiation of tingling or numbness. Has not notice any weakness. Has been doing some stretching, heat and ibuprofen with mild relief.rates the severity pain as 5 out of 10 just is concerned because it has not made any improvement for quite some time.     patient did have x-rays done 06/01/2016. These were independently visualized by me. X-ray showed very minimal arthritic changes of the lower lumbar spine the patient does have some thoracic scoliosis noted.  Past Medical History:  Diagnosis Date  . Gestational diabetes   . HSV infection   . Neck pain, musculoskeletal 02/06/2011  . 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 Seasonal allergic rhinitis   . Stress fracture 2008   left ankle   Past Surgical History:  Procedure Laterality Date  . BUNIONECTOMY  08/2013   right  . [place]   X 1  . cyst removed from back of right leg  6-12  . ENDOMETRIAL ABLATION  2010   HER OPTION ABLATION FOR MENORRHAGIA  . EYE SURGERY  age 71   right eye for strabismus   Social History   Social History  . Marital status: Married    Spouse name: N/A  . Number of children: N/A  . Years of education: N/A   Social History Main Topics  . Smoking status: Never Smoker  . Smokeless tobacco: Never Used  . Alcohol use 0.0  oz/week    0 drink(s) per week     Comment: occasionaly  . Drug use: No  . Sexual activity: Yes    Partners: Male    Birth control/ protection: Other-see comments     Comment: husband vasectomy   Other Topics Concern  . 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   No T/A/Ds.   No Known Allergies Family History  Problem Relation Age of Onset  . Diabetes Mother     Type 2  . Hypertension Mother   . Hyperlipidemia Mother   . Depression Mother   . Seasonal affective disorder Father   . Cancer Father     MELANOMA  . Bipolar disorder Brother   . Drug abuse Brother     cocaine, vicodin, pain killers  . ADD / ADHD Son   . Depression Son   . Cancer Maternal Grandmother     gyn possibly cervical or uterine  . Diabetes Maternal Grandfather     Type 2  . Hypertension Maternal Grandfather   . Depression Maternal Grandfather   . Alcohol abuse Paternal Grandfather     Past medical history, social, surgical and family history all reviewed in electronic medical record.  No pertanent information unless stated regarding to the chief complaint.   Review of Systems:Review  of systems updated and as accurate as of 06/11/16  No headache, visual changes, nausea, vomiting, diarrhea, constipation, dizziness, abdominal pain, skin rash, fevers, chills, night sweats, weight loss, swollen lymph nodes, body aches, joint swelling, muscle aches, chest pain, shortness of breath, mood changes.   Objective  Last menstrual period 05/13/2016. Systems examined below as of 06/11/16   General: No apparent distress alert and oriented x3 mood and affect normal, dressed appropriately.  HEENT: Pupils equal, extraocular movements intact  Respiratory: Patient's speak in full sentences and does not appear short of breath  Cardiovascular: No lower extremity edema, non tender, no erythema  Skin: Warm dry intact with no signs of infection or rash on  extremities or on axial skeleton.  Abdomen: Soft nontender  Neuro: Cranial nerves II through XII are intact, neurovascularly intact in all extremities with 2+ DTRs and 2+ pulses.  Lymph: No lymphadenopathy of posterior or anterior cervical chain or axillae bilaterally.  Gait normal with good balance and coordination.  MSK:  Non tender with full range of motion and good stability and symmetric strength and tone of shoulders, elbows, wrist, hip, knee and ankles bilaterally.  Back Exam:  Inspection: Unremarkable  Motion: Flexion 40 deg, Extension 25 deg, Side Bending to 35 deg bilaterally,  Rotation to 40 deg bilaterally  SLR laying: Negative  XSLR laying: Negative  Palpable tenderness: Tender to palpation of the right sacroiliac joint. FABER: positive right side.overlying lipoma over the right sacroiliac joint noted. Freely movable Sensory change: Gross sensation intact to all lumbar and sacral dermatomes.  Reflexes: 2+ at both patellar tendons, 2+ at achilles tendons, Babinski's downgoing.  Strength at foot  Plantar-flexion: 5/5 Dorsi-flexion: 5/5 Eversion: 5/5 Inversion: 5/5  Leg strength  Quad: 5/5 Hamstring: 5/5 Hip flexor: 5/5 Hip abductors: 4/5 but symmetric  Osteopathic findings C2 flexed rotated and side bent right T3 extended rotated and side bent right  L3 flexed rotated and side bent left Sacrum right on right  Procedure note 97110; 15 minutes spent for Therapeutic exercises as stated in above notes.  This included exercises focusing on stretching, strengthening, with significant focus on eccentric aspects. Sacroiliac Joint Mobilization and Rehab 1. Work on pretzel stretching, shoulder back and leg draped in front. 3-5 sets, 30 sec.. 2. hip abductor rotations. standing, hip flexion and rotation outward then inward. 3 sets, 15 reps. when can do comfortably, add ankle weights starting at 2 pounds.  3. cross over stretching - shoulder back to ground, same side leg crossover. 3-5  sets for 30 min..  4. rolling up and back knees to chest and rocking. 5. sacral tilt - 5 sets, hold for 5-10 seconds   Proper technique shown and discussed handout in great detail with ATC.  All questions were discussed and answered.     Impression and Recommendations:     This case required medical decision making of moderate complexity.      Note: This dictation was prepared with Dragon dictation along with smaller phrase technology. Any transcriptional errors that result from this process are unintentional.

## 2016-06-12 ENCOUNTER — Encounter: Payer: Self-pay | Admitting: Family Medicine

## 2016-06-12 ENCOUNTER — Ambulatory Visit (INDEPENDENT_AMBULATORY_CARE_PROVIDER_SITE_OTHER): Payer: BLUE CROSS/BLUE SHIELD | Admitting: Family Medicine

## 2016-06-12 DIAGNOSIS — M533 Sacrococcygeal disorders, not elsewhere classified: Secondary | ICD-10-CM

## 2016-06-12 DIAGNOSIS — M999 Biomechanical lesion, unspecified: Secondary | ICD-10-CM | POA: Diagnosis not present

## 2016-06-12 MED ORDER — DICLOFENAC SODIUM 2 % TD SOLN: TRANSDERMAL | 3 refills | Status: DC

## 2016-06-12 NOTE — Assessment & Plan Note (Signed)
Discussed home exercises and work with Product/process development scientist in greater detail. We disscussed icing regimen. Pennsaid RX, Discussed proper lifting mechanics, responded well to OMT RTC in 4-6 weeks

## 2016-06-12 NOTE — Assessment & Plan Note (Signed)
Decision today to treat with OMT was based on Physical Exam  After verbal consent patient was treated with HVLA, ME FPR techniques in cervical, thoracic, lumba and sacral areas  Patient tolerated the procedure well with improvement in symptoms  Patient given exercises, stretches and lifestyle modifications  See medications in patient instructions if given  Patient will follow up in 3-4 weeks

## 2016-06-12 NOTE — Patient Instructions (Signed)
Good to see you  Ice 20 minutes 2 times daily. Usually after activity and before bed. pennsaid pinkie amount topically 2 times daily as needed.   Vitamin D 2000 IU dialy  Turmeric 500mg  twice daily  Exercises 3 times a week.  Keep wearing good shoes.  Happy holidays!  See me again in 3-4 weeks.

## 2016-06-13 ENCOUNTER — Encounter: Payer: Self-pay | Admitting: Family Medicine

## 2016-06-14 ENCOUNTER — Other Ambulatory Visit: Payer: Self-pay | Admitting: *Deleted

## 2016-06-14 DIAGNOSIS — F411 Generalized anxiety disorder: Secondary | ICD-10-CM

## 2016-06-14 MED ORDER — ALPRAZOLAM 0.25 MG PO TABS: ORAL_TABLET | ORAL | 5 refills | Status: DC

## 2016-06-14 NOTE — Telephone Encounter (Signed)
[facility]ax from [facility] OR requesting refill for al[facility]razolam  LOV: 10/08/15-CPE, 04/09/15 F/U NOV: None Last written: 11/12/15 #30 w/ 5RF  Please advise. Thanks.

## 2016-06-14 NOTE — Telephone Encounter (Signed)
Rx faxed

## 2016-06-29 ENCOUNTER — Ambulatory Visit (AMBULATORY_SURGERY_CENTER): Payer: Self-pay | Admitting: *Deleted

## 2016-06-29 VITALS — Ht 63.0 in | Wt 132.6 lb

## 2016-06-29 DIAGNOSIS — Z1211 Encounter for screening for malignant neoplasm of colon: Secondary | ICD-10-CM

## 2016-06-29 MED ORDER — NA SULFATE-K SULFATE-MG SULF 17.5-3.13-1.6 GM/177ML PO SOLN: ORAL | 0 refills | Status: DC

## 2016-06-29 NOTE — Progress Notes (Signed)
No egg or soy allergy  No anesthesia or intubation problems per pt  No diet medications taken  No home oxygen used or sleep apnea hx  PNM than $50.00 coupon of Suprep given

## 2016-07-03 DIAGNOSIS — Z86010 Personal history of colonic polyps: Secondary | ICD-10-CM

## 2016-07-03 DIAGNOSIS — Z860101 Personal history of adenomatous and serrated colon polyps: Secondary | ICD-10-CM

## 2016-07-03 HISTORY — DX: Personal history of adenomatous and serrated colon polyps: Z86.0101

## 2016-07-03 HISTORY — DX: Personal history of colonic polyps: Z86.010

## 2016-07-10 ENCOUNTER — Telehealth: Payer: Self-pay | Admitting: Gastroenterology

## 2016-07-10 NOTE — Telephone Encounter (Signed)
[facility]axed [facility] in [place] card and instructed that our office does not perform Prior Auths for preps. Also asked to contact pt when Rx is ready.

## 2016-07-12 ENCOUNTER — Encounter: Payer: Self-pay | Admitting: Gastroenterology

## 2016-07-12 ENCOUNTER — Ambulatory Visit (AMBULATORY_SURGERY_CENTER): Payer: BLUE CROSS/BLUE SHIELD | Admitting: Gastroenterology

## 2016-07-12 VITALS — BP 130/61 | HR 66 | Temp 98.0°F | Resp 16 | Ht 63.0 in | Wt 132.0 lb

## 2016-07-12 DIAGNOSIS — D125 Benign neoplasm of sigmoid colon: Secondary | ICD-10-CM | POA: Diagnosis not present

## 2016-07-12 DIAGNOSIS — Z1211 Encounter for screening for malignant neoplasm of colon: Secondary | ICD-10-CM | POA: Diagnosis not present

## 2016-07-12 DIAGNOSIS — Z1212 Encounter for screening for malignant neoplasm of rectum: Secondary | ICD-10-CM | POA: Diagnosis not present

## 2016-07-12 HISTORY — PX: COLONOSCOPY W/ POLYPECTOMY: SHX1380

## 2016-07-12 MED ORDER — SODIUM CHLORIDE 0.9 % IV SOLN: 500.0000 mL | INTRAVENOUS | Status: DC

## 2016-07-12 NOTE — Patient Instructions (Signed)
YOU HAD AN ENDOSCOPIC PROCEDURE TODAY AT [facility]:   Refer to the procedure report that was given to you for any specific questions about what was found during the examination.  If the procedure report does not answer your questions, please call your gastroenterologist to clarify.  If you requested that your care partner not be given the details of your procedure findings, then the procedure report has been included in a sealed envelope for you to review at your convenience later.  YOU SHOULD EXPECT: Some feelings of bloating in the abdomen. Passage of more gas than usual.  Walking can help get rid of the air that was put into your GI tract during the procedure and reduce the bloating. If you had a lower endoscopy (such as a colonoscopy or flexible sigmoidoscopy) you may notice spotting of blood in your stool or on the toilet paper. If you underwent a bowel prep for your procedure, you may not have a normal bowel movement for a few days.  Please Note:  You might notice some irritation and congestion in your nose or some drainage.  This is from the oxygen used during your procedure.  There is no need for concern and it should clear up in a day or so.  SYMPTOMS TO REPORT IMMEDIATELY:   Following lower endoscopy (colonoscopy or flexible sigmoidoscopy):  Excessive amounts of blood in the stool  Significant tenderness or worsening of abdominal pains  Swelling of the abdomen that is new, acute  Fever of 100F or higher   Following upper endoscopy (EGD)  Vomiting of blood or coffee ground material  New chest pain or pain under the shoulder blades  Painful or persistently difficult swallowing  New shortness of breath  Fever of 100F or higher  Black, tarry-looking stools  For urgent or emergent issues, a gastroenterologist can be reached at any hour by calling [phone].   DIET:  We do recommend a small meal at first, but then you may proceed to your regular diet.  Drink  plenty of fluids but you should avoid alcoholic beverages for 24 hours.  ACTIVITY:  You should plan to take it easy for the rest of today and you should NOT DRIVE or use heavy machinery until tomorrow (because of the sedation medicines used during the test).    FOLLOW UP: Our staff will call the number listed on your records the next business day following your procedure to check on you and address any questions or concerns that you may have regarding the information given to you following your procedure. If we do not reach you, we will leave a message.  However, if you are feeling well and you are not experiencing any problems, there is no need to return our call.  We will assume that you have returned to your regular daily activities without incident.  If any biopsies were taken you will be contacted by phone or by letter within the next 1-3 weeks.  Please call us at [phone] if you have not heard about the biopsies in 3 weeks.    SIGNATURES/CONFIDENTIALITY: You and/or your care partner have signed paperwork which will be entered into your electronic medical record.  These signatures attest to the fact that that the information above on your After Visit Summary has been reviewed and is understood.  Full responsibility of the confidentiality of this discharge information lies with you and/or your care-partner.    Handouts were given to your care partner on polyps  and diverticulosis. No aspirin, aspirin products,  ibuprofen, naproxen, advil, motrin, aleve, or other non-steroidal anti-inflammatory drugs for 14 days after polyp removal. You may resume your current medications today. Await biopsy results. Please call if any questions or concerns.

## 2016-07-12 NOTE — Progress Notes (Signed)
No problems noted in the recovery room. maw 

## 2016-07-12 NOTE — Op Note (Signed)
[facility] Patient Name: Karen Oconnell Procedure Date: 07/12/2016 12:41 PM MRN: [id number] Endoscopist: Remo Lipps P. Armbruster MD, MD Age: 51 Referring MD:  Date of Birth: [birth year] Gender: Female Account #: [id number] Procedure:                Colonoscopy Indications:              Screening for colorectal malignant neoplasm, This                            is the patient's first colonoscopy Medicines:                Monitored Anesthesia Care Procedure:                Pre-Anesthesia Assessment:                           - Prior to the procedure, a History and Physical                            was performed, and patient medications and                            allergies were reviewed. The patient's tolerance of                            previous anesthesia was also reviewed. The risks                            and benefits of the procedure and the sedation                            options and risks were discussed with the patient.                            All questions were answered, and informed consent                            was obtained. Prior Anticoagulants: The patient has                            taken no previous anticoagulant or antiplatelet                            agents. ASA Grade Assessment: II - A patient with                            mild systemic disease. After reviewing the risks                            and benefits, the patient was deemed in                            satisfactory condition to undergo the procedure.  After obtaining informed consent, the colonoscope                            was passed under direct vision. Throughout the                            procedure, the patient's blood pressure, pulse, and                            oxygen saturations were monitored continuously. The                            Model PCF-[id number]) scope was introduced                            through the  anus and advanced to the the cecum,                            identified by appendiceal orifice and ileocecal                            valve. The colonoscopy was performed without                            difficulty. The patient tolerated the procedure                            well. The quality of the bowel preparation was                            good. The ileocecal valve, appendiceal orifice, and                            rectum were photographed. Scope In: 1:35:19 PM Scope Out: 1:50:23 PM Scope Withdrawal Time: 0 hours 12 minutes 41 seconds  Total Procedure Duration: 0 hours 15 minutes 4 seconds  Findings:                 The perianal and digital rectal examinations were                            normal.                           Multiple medium-mouthed diverticula were found in                            the left colon.                           Two sessile polyps were found in the sigmoid colon.                            The polyps were 4 to 5 mm in size. These polyps  were removed with a cold snare. Resection and                            retrieval were complete.                           The exam was otherwise without abnormality on                            direct and retroflexion views. Complications:            No immediate complications. Estimated blood loss:                            Minimal. Estimated Blood Loss:     Estimated blood loss was minimal. Impression:               - Diverticulosis in the left colon.                           - Two 4 to 5 mm polyps in the sigmoid colon,                            removed with a cold snare. Resected and retrieved.                           - The examination was otherwise normal on direct                            and retroflexion views. Recommendation:           - Patient has a contact number available for                            emergencies. The signs and symptoms of potential                             delayed complications were discussed with the                            patient. Return to normal activities tomorrow.                            Written discharge instructions were provided to the                            patient.                           - Resume previous diet.                           - Continue present medications.                           - No ibuprofen, naproxen, or other non-steroidal  anti-inflammatory drugs for 2 weeks after polyp                            removal.                           - Await pathology results.                           - Repeat colonoscopy is recommended for                            surveillance. The colonoscopy date will be                            determined after pathology results from today's                            exam become available for review. Remo Lipps P. Armbruster MD, MD 07/12/2016 1:55:02 PM This report has been signed electronically.

## 2016-07-12 NOTE — Progress Notes (Signed)
Called to room to assist during endoscopic procedure.  Patient ID and intended procedure confirmed with present staff. Received instructions for my participation in the procedure from the performing physician.  

## 2016-07-12 NOTE — Progress Notes (Signed)
A and O x3. Report to RN. Tolerated MAC anesthesia well.

## 2016-07-13 ENCOUNTER — Telehealth: Payer: Self-pay | Admitting: *Deleted

## 2016-07-13 NOTE — Telephone Encounter (Signed)
  Follow up Call-  Call back number 07/12/2016  Post procedure Call Back phone  # [phone]  Permission to leave phone message Yes  Some recent data might be hidden     Patient questions:  Do you have a fever, pain , or abdominal swelling? No. Pain Score  0 *  Have you tolerated food without any problems? Yes   Have you been able to return to your normal activities? yes  Do you have any questions about your discharge instructions: Diet   Yes.   Medications  Yes.   Follow up visit  Yes.    Do you have questions or concerns about your Care? No.  Actions: * If pain score is 4 or above: No action needed, pain <4.

## 2016-07-18 ENCOUNTER — Encounter: Payer: Self-pay | Admitting: Gastroenterology

## 2016-07-21 ENCOUNTER — Encounter: Payer: Self-pay | Admitting: Family Medicine

## 2016-08-31 DIAGNOSIS — D259 Leiomyoma of uterus, unspecified: Secondary | ICD-10-CM

## 2016-08-31 DIAGNOSIS — R140 Abdominal distension (gaseous): Secondary | ICD-10-CM

## 2016-08-31 HISTORY — DX: Leiomyoma of uterus, unspecified: D25.9

## 2016-08-31 HISTORY — DX: Abdominal distension (gaseous): R14.0

## 2016-09-20 ENCOUNTER — Ambulatory Visit (INDEPENDENT_AMBULATORY_CARE_PROVIDER_SITE_OTHER): Payer: BLUE CROSS/BLUE SHIELD | Admitting: Women's Health

## 2016-09-20 ENCOUNTER — Encounter: Payer: Self-pay | Admitting: Women's Health

## 2016-09-20 VITALS — BP 114/74 | Ht 63.5 in | Wt 134.4 lb

## 2016-09-20 DIAGNOSIS — R140 Abdominal distension (gaseous): Secondary | ICD-10-CM | POA: Diagnosis not present

## 2016-09-20 DIAGNOSIS — F411 Generalized anxiety disorder: Secondary | ICD-10-CM | POA: Diagnosis not present

## 2016-09-20 DIAGNOSIS — Z79890 Hormone replacement therapy (postmenopausal): Secondary | ICD-10-CM

## 2016-09-20 DIAGNOSIS — Z01419 Encounter for gynecological examination (general) (routine) without abnormal findings: Secondary | ICD-10-CM

## 2016-09-20 MED ORDER — SERTRALINE HCL 100 MG PO TABS: ORAL_TABLET | ORAL | 4 refills | Status: DC

## 2016-09-20 MED ORDER — PROGESTERONE MICRONIZED 200 MG PO CAPS
200.0000 mg | ORAL_CAPSULE | Freq: Every day | ORAL | 4 refills | Status: DC
Start: 2016-09-20 — End: 2017-04-26

## 2016-09-20 MED ORDER — ESTRADIOL 0.05 MG/24HR TD PTTW: 1.0000 | MEDICATED_PATCH | TRANSDERMAL | 4 refills | Status: DC

## 2016-09-20 NOTE — Patient Instructions (Addendum)
Menopause and Hormone Replacement Therapy What is hormone replacement therapy? Hormone replacement therapy (HRT) is the use of artificial (synthetic) hormones to replace hormones that your body stops producing during menopause. Menopause is the normal time of life when menstrual periods stop completely and the ovaries stop producing the female hormones estrogen and progesterone. This lack of hormones can affect your health and cause undesirable symptoms. HRT can relieve some of those symptoms. What are my options for HRT? HRT may consist of the synthetic hormones estrogen and progestin, or it may consist of only estrogen (estrogen-only therapy). You and your health care provider will decide which form of HRT is best for you. If you choose to be on HRT and you have a uterus, estrogen and progestin are usually prescribed. Estrogen-only therapy is used for women who do not have a uterus. Possible options for taking HRT include:  Pills.  Patches.  Gels.  Sprays.  Vaginal cream.  Vaginal rings.  Vaginal inserts. The amount of hormone(s) that you take and how long you take the hormone(s) varies depending on your individual health. It is important to:  Begin HRT with the lowest possible dosage.  Stop HRT as soon as your health care provider tells you to stop.  Work with your health care provider so that you feel informed and comfortable with your decisions. What are the benefits of HRT? HRT can reduce the frequency and severity of menopausal symptoms. Benefits of HRT vary depending on the menopausal symptoms that you have, the severity of your symptoms, and your overall health. HRT may help to improve the following menopausal symptoms:  Hot flashes and night sweats. These are sudden feelings of heat that spread over the face and body. The skin may turn red, like a blush. Night sweats are hot flashes that happen while you are sleeping or trying to sleep.  Bone loss (osteoporosis). The body  loses calcium more quickly after menopause, causing the bones to become weaker. This can increase the risk for bone breaks (fractures).  Vaginal dryness. The lining of the vagina can become thin and dry, which can cause pain during sexual intercourse or cause infection, burning, or itching.  Urinary tract infections.  Urinary incontinence. This is a decreased ability to control when you urinate.  Irritability.  Short-term memory problems. What are the risks of HRT? Risks of HRT vary depending on your individual health and medical history. Risks of HRT also depend on whether you receive both estrogen and progestin or you receive estrogen only.HRT may increase the risk of:  Spotting. This is when a small amount of bloodleaks from the vagina unexpectedly.  Endometrial cancer. This cancer is in the lining of the uterus (endometrium).  Breast cancer.  Increased density of breast tissue. This can make it harder to find breast cancer on a breast X-ray (mammogram).  Stroke.  Heart attack.  Blood clots.  Gallbladder disease. Risks of HRT can increase if you have any of the following conditions:  Endometrial cancer.  Liver disease.  Heart disease.  Breast cancer.  History of blood clots.  History of stroke. How should I care for myself while I am on HRT?  Take over-the-counter and prescription medicines only as told by your health care provider.  Get mammograms, pelvic exams, and medical checkups as often as told by your health care provider.  Have Pap tests done as often as told by your health care provider. A Pap test is sometimes called a Pap smear. It is a screening  test that is used to check for signs of cancer of the cervix and vagina. A Pap test can also identify the presence of infection or precancerous changes. Pap tests may be done:  Every 3 years, starting at age 85.  Every 5 years, starting after age 100, in combination with testing for human papillomavirus  (HPV).  More often or less often depending on other medical conditions you have, your age, and other risk factors.  It is your responsibility to get your Pap test results. Ask your health care provider or the department performing the test when your results will be ready.  Keep all follow-up visits as told by your health care provider. This is important. When should I seek medical care? Talk with your health care provider if:  You have any of these:  Pain or swelling in your legs.  Shortness of breath.  Chest pain.  Lumps or changes in your breasts or armpits.  Slurred speech.  Pain, burning, or bleeding when you urine.  You develop any of these:  Unusual vaginal bleeding.  Dizziness or headaches.  Weakness or numbness in any part of your arms or legs.  Pain in your abdomen. This information is not intended to replace advice given to you by your health care provider. Make sure you discuss any questions you have with your health care provider. Document Released: 03/18/2003 Document Revised: 05/16/2016 Document Reviewed: 12/21/2014 Elsevier Interactive Patient Education  2017 [place] Maintenance for Postmenopausal Women Menopause is a normal process in which your reproductive ability comes to an end. This process happens gradually over a span of months to years, usually between the ages of 52 and 28. Menopause is complete when you have missed 12 consecutive menstrual periods. It is important to talk with your health care provider about some of the most common conditions that affect postmenopausal women, such as heart disease, cancer, and bone loss (osteoporosis). Adopting a healthy lifestyle and getting preventive care can help to promote your health and wellness. Those actions can also lower your chances of developing some of these common conditions. What should I know about menopause? During menopause, you may experience a number of symptoms, such  as:  Moderate-to-severe hot flashes.  Night sweats.  Decrease in sex drive.  Mood swings.  Headaches.  Tiredness.  Irritability.  Memory problems.  Insomnia. Choosing to treat or not to treat menopausal changes is an individual decision that you make with your health care provider. What should I know about hormone replacement therapy and supplements? Hormone therapy products are effective for treating symptoms that are associated with menopause, such as hot flashes and night sweats. Hormone replacement carries certain risks, especially as you become older. If you are thinking about using estrogen or estrogen with progestin treatments, discuss the benefits and risks with your health care provider. What should I know about heart disease and stroke? Heart disease, heart attack, and stroke become more likely as you age. This may be due, in part, to the hormonal changes that your body experiences during menopause. These can affect how your body processes dietary fats, triglycerides, and cholesterol. Heart attack and stroke are both medical emergencies. There are many things that you can do to help prevent heart disease and stroke:  Have your blood pressure checked at least every 1-2 years. High blood pressure causes heart disease and increases the risk of stroke.  If you are 49-35 years old, ask your health care provider if you should take aspirin to prevent  a heart attack or a stroke.  Do not use any tobacco products, including cigarettes, chewing tobacco, or electronic cigarettes. If you need help quitting, ask your health care provider.  It is important to eat a healthy diet and maintain a healthy weight.  Be sure to include plenty of vegetables, fruits, low-fat dairy products, and lean protein.  Avoid eating foods that are high in solid fats, added sugars, or salt (sodium).  Get regular exercise. This is one of the most important things that you can do for your health.  Try to  exercise for at least 150 minutes each week. The type of exercise that you do should increase your heart rate and make you sweat. This is known as moderate-intensity exercise.  Try to do strengthening exercises at least twice each week. Do these in addition to the moderate-intensity exercise.  Know your numbers.Ask your health care provider to check your cholesterol and your blood glucose. Continue to have your blood tested as directed by your health care provider. What should I know about cancer screening? There are several types of cancer. Take the following steps to reduce your risk and to catch any cancer development as early as possible. Breast Cancer  Practice breast self-awareness.  This means understanding how your breasts normally appear and feel.  It also means doing regular breast self-exams. Let your health care provider know about any changes, no matter how small.  If you are 76 or older, have a clinician do a breast exam (clinical breast exam or CBE) every year. Depending on your age, family history, and medical history, it may be recommended that you also have a yearly breast X-ray (mammogram).  If you have a family history of breast cancer, talk with your health care provider about genetic screening.  If you are at high risk for breast cancer, talk with your health care provider about having an MRI and a mammogram every year.  Breast cancer (BRCA) gene test is recommended for women who have family members with BRCA-related cancers. Results of the assessment will determine the need for genetic counseling and BRCA1 and for BRCA2 testing. BRCA-related cancers include these types:  Breast. This occurs in males or females.  Ovarian.  Tubal. This may also be called fallopian tube cancer.  Cancer of the abdominal or pelvic lining (peritoneal cancer).  Prostate.  Pancreatic. Cervical, Uterine, and Ovarian Cancer  Your health care provider may recommend that you be screened  regularly for cancer of the pelvic organs. These include your ovaries, uterus, and vagina. This screening involves a pelvic exam, which includes checking for microscopic changes to the surface of your cervix (Pap test).  For women ages 21-65, health care providers may recommend a pelvic exam and a Pap test every three years. For women ages 67-65, they may recommend the Pap test and pelvic exam, combined with testing for human papilloma virus (HPV), every five years. Some types of HPV increase your risk of cervical cancer. Testing for HPV may also be done on women of any age who have unclear Pap test results.  Other health care providers may not recommend any screening for nonpregnant women who are considered low risk for pelvic cancer and have no symptoms. Ask your health care provider if a screening pelvic exam is right for you.  If you have had past treatment for cervical cancer or a condition that could lead to cancer, you need Pap tests and screening for cancer for at least 20 years after your treatment.  If Pap tests have been discontinued for you, your risk factors (such as having a new sexual partner) need to be reassessed to determine if you should start having screenings again. Some women have medical problems that increase the chance of getting cervical cancer. In these cases, your health care provider may recommend that you have screening and Pap tests more often.  If you have a family history of uterine cancer or ovarian cancer, talk with your health care provider about genetic screening.  If you have vaginal bleeding after reaching menopause, tell your health care provider.  There are currently no reliable tests available to screen for ovarian cancer. Lung Cancer  Lung cancer screening is recommended for adults 85-36 years old who are at high risk for lung cancer because of a history of smoking. A yearly low-dose CT scan of the lungs is recommended if you:  Currently smoke.  Have a  history of at least 30 pack-years of smoking and you currently smoke or have quit within the past 15 years. A pack-year is smoking an average of one pack of cigarettes per day for one year. Yearly screening should:  Continue until it has been 15 years since you quit.  Stop if you develop a health problem that would prevent you from having lung cancer treatment. Colorectal Cancer  This type of cancer can be detected and can often be prevented.  Routine colorectal cancer screening usually begins at age 74 and continues through age 28.  If you have risk factors for colon cancer, your health care provider may recommend that you be screened at an earlier age.  If you have a family history of colorectal cancer, talk with your health care provider about genetic screening.  Your health care provider may also recommend using home test kits to check for hidden blood in your stool.  A small camera at the end of a tube can be used to examine your colon directly (sigmoidoscopy or colonoscopy). This is done to check for the earliest forms of colorectal cancer.  Direct examination of the colon should be repeated every 5-10 years until age 1. However, if early forms of precancerous polyps or small growths are found or if you have a family history or genetic risk for colorectal cancer, you may need to be screened more often. Skin Cancer  Check your skin from head to toe regularly.  Monitor any moles. Be sure to tell your health care provider:  About any new moles or changes in moles, especially if there is a change in a mole's shape or color.  If you have a mole that is larger than the size of a pencil eraser.  If any of your family members has a history of skin cancer, especially at a young age, talk with your health care provider about genetic screening.  Always use sunscreen. Apply sunscreen liberally and repeatedly throughout the day.  Whenever you are outside, protect yourself by wearing long  sleeves, pants, a wide-brimmed hat, and sunglasses. What should I know about osteoporosis? Osteoporosis is a condition in which bone destruction happens more quickly than new bone creation. After menopause, you may be at an increased risk for osteoporosis. To help prevent osteoporosis or the bone fractures that can happen because of osteoporosis, the following is recommended:  If you are 33-95 years old, get at least 1,000 mg of calcium and at least 600 mg of vitamin D per day.  If you are older than age 9 but younger than age  70, get at least 1,200 mg of calcium and at least 600 mg of vitamin D per day.  If you are older than age 76, get at least 1,200 mg of calcium and at least 800 mg of vitamin D per day. Smoking and excessive alcohol intake increase the risk of osteoporosis. Eat foods that are rich in calcium and vitamin D, and do weight-bearing exercises several times each week as directed by your health care provider. What should I know about how menopause affects my mental health? Depression may occur at any age, but it is more common as you become older. Common symptoms of depression include:  Low or sad mood.  Changes in sleep patterns.  Changes in appetite or eating patterns.  Feeling an overall lack of motivation or enjoyment of activities that you previously enjoyed.  Frequent crying spells. Talk with your health care provider if you think that you are experiencing depression. What should I know about immunizations? It is important that you get and maintain your immunizations. These include:  Tetanus, diphtheria, and pertussis (Tdap) booster vaccine.  Influenza every year before the flu season begins.  Pneumonia vaccine.  Shingles vaccine. Your health care provider may also recommend other immunizations. This information is not intended to replace advice given to you by your health care provider. Make sure you discuss any questions you have with your health care  provider. Document Released: 08/11/2005 Document Revised: 01/07/2016 Document Reviewed: 03/23/2015 Elsevier Interactive Patient Education  2017 [employer].

## 2016-09-20 NOTE — Progress Notes (Signed)
Karen Oconnell 03-29-1966 [id number]    History:    Presents for annual exam.  Regular monthly cycles until December. Has had increased hot flushes, poor sleep, vaginal dryness. Anxiety and depression on Zoloft per primary care. 07/2016 2 benign colon polyps adenomatous 5 year follow-up. Mammogram 05/2016 normal after ultrasound. Normal Pap history. History of GDM.  Past medical history, past surgical history, family history and social history were all reviewed and documented in the EPIC chart. 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.  ROS:  A ROS was performed and pertinent positives and negatives are included.  Exam:  Vitals:   09/20/16 1433  BP: 114/74  Weight: 134 lb 6.4 oz (61 kg)  Height: 5' 3.5" (1.613 m)   Body mass index is 23.43 kg/m.   General appearance:  Normal Thyroid:  Symmetrical, normal in size, without palpable masses or nodularity. Respiratory  Auscultation:  Clear without wheezing or rhonchi Cardiovascular  Auscultation:  Regular rate, without rubs, murmurs or gallops  Edema/varicosities:  Not grossly evident Abdominal  Soft,nontender, without masses, guarding or rebound.  Liver/spleen:  No organomegaly noted  Hernia:  None appreciated  Skin  Inspection:  Grossly normal   Breasts: Examined lying and sitting.     Right: Without masses, retractions, discharge or axillary adenopathy.     Left: Without masses, retractions, discharge or axillary adenopathy. Gentitourinary   Inguinal/mons:  Normal without inguinal adenopathy  External genitalia:  Normal  BUS/Urethra/Skene's glands:  Normal  Vagina:  Normal  Cervix:  Normal  Uterus:   normal in size, shape and contour.  Midline and mobile  Adnexa/parametria:     Rt: Without masses or tenderness.   Lt: Without masses or tenderness.  Anus and perineum: Normal  Digital rectal exam: Normal sphincter tone without palpated masses or  tenderness  Assessment/Plan:  51 y.o. M WF G2 P2 for annual exam with numerous menopausal complaints and low abdominal bloating/discomfort.  Postmenopausal symptoms Anxiety and depression-meds and labs at primary care  Plan: Reviewed the bloating may be related to menopause, will check ultrasound. Menopause reviewed, options reviewed, HRT risk of like clots, strokes and breast cancer discussed. Prometrium 200 mg at bedtime day 1 through 12 of each month, Vivelle patch 0.05 patch twice weekly, proper use, instructed to call if no relief of symptoms. Reviewed may get a light bleed after Prometrium. SBE's, continue annual 3-D screening mammogram, calcium rich diet, vitamin D 2000 daily encouraged. Reviewed importance of regular exercise. Pap with HR HPV typing, new screening guidelines reviewed.     Huel Cote [facility], 6:35 PM 09/20/2016

## 2016-09-21 LAB — PAP IG AND HPV HIGH-RISK: HPV DNA High Risk: NOT DETECTED

## 2016-09-25 ENCOUNTER — Encounter: Payer: Self-pay | Admitting: Family Medicine

## 2016-09-25 ENCOUNTER — Other Ambulatory Visit: Payer: Self-pay | Admitting: *Deleted

## 2016-09-25 MED ORDER — ZOLPIDEM TARTRATE 10 MG PO TABS: 10.0000 mg | ORAL_TABLET | Freq: Every evening | ORAL | 5 refills | Status: DC | PRN

## 2016-09-25 NOTE — Telephone Encounter (Signed)
Rx faxed

## 2016-09-25 NOTE — Telephone Encounter (Signed)
Fax from [facility].  RF request for zolpidem LOV: 05/29/16 Next ov: None Last written: 03/28/16 #30 w/ 5RF  Please advise. Thanks.

## 2016-09-27 ENCOUNTER — Ambulatory Visit (INDEPENDENT_AMBULATORY_CARE_PROVIDER_SITE_OTHER): Payer: BLUE CROSS/BLUE SHIELD | Admitting: Women's Health

## 2016-09-27 ENCOUNTER — Other Ambulatory Visit: Payer: Self-pay | Admitting: Women's Health

## 2016-09-27 ENCOUNTER — Ambulatory Visit (INDEPENDENT_AMBULATORY_CARE_PROVIDER_SITE_OTHER): Payer: BLUE CROSS/BLUE SHIELD

## 2016-09-27 VITALS — BP 112/79

## 2016-09-27 DIAGNOSIS — N7011 Chronic salpingitis: Secondary | ICD-10-CM

## 2016-09-27 DIAGNOSIS — N9489 Other specified conditions associated with female genital organs and menstrual cycle: Secondary | ICD-10-CM

## 2016-09-27 DIAGNOSIS — R140 Abdominal distension (gaseous): Secondary | ICD-10-CM

## 2016-09-27 DIAGNOSIS — N831 Corpus luteum cyst of ovary, unspecified side: Secondary | ICD-10-CM

## 2016-09-27 DIAGNOSIS — R938 Abnormal findings on diagnostic imaging of other specified body structures: Secondary | ICD-10-CM | POA: Diagnosis not present

## 2016-09-27 DIAGNOSIS — R9389 Abnormal findings on diagnostic imaging of other specified body structures: Secondary | ICD-10-CM

## 2016-09-27 DIAGNOSIS — N949 Unspecified condition associated with female genital organs and menstrual cycle: Secondary | ICD-10-CM | POA: Diagnosis not present

## 2016-09-27 MED ORDER — DICYCLOMINE HCL 20 MG PO TABS: 20.0000 mg | ORAL_TABLET | Freq: Three times a day (TID) | ORAL | 0 refills | Status: DC

## 2016-09-27 MED ORDER — MEDROXYPROGESTERONE ACETATE 10 MG PO TABS: 10.0000 mg | ORAL_TABLET | Freq: Every day | ORAL | 0 refills | Status: DC

## 2016-09-27 NOTE — Patient Instructions (Signed)
Food Choices for Gastroesophageal Reflux Disease, Adult When you have gastroesophageal reflux disease (GERD), the foods you eat and your eating habits are very important. Choosing the right foods can help ease the discomfort of GERD. Consider working with a diet and nutrition specialist (dietitian) to help you make healthy food choices. What general guidelines should I follow? Eating plan   Choose healthy foods low in fat, such as fruits, vegetables, whole grains, low-fat dairy products, and lean meat, fish, and poultry.  Eat frequent, small meals instead of three large meals each day. Eat your meals slowly, in a relaxed setting. Avoid bending over or lying down until 2-3 hours after eating.  Limit high-fat foods such as fatty meats or fried foods.  Limit your intake of oils, butter, and shortening to less than 8 teaspoons each day.  Avoid the following:  Foods that cause symptoms. These may be different for different people. Keep a food diary to keep track of foods that cause symptoms.  Alcohol.  Drinking large amounts of liquid with meals.  Eating meals during the 2-3 hours before bed.  Cook foods using methods other than frying. This may include baking, grilling, or broiling. Lifestyle    Maintain a healthy weight. Ask your health care provider what weight is healthy for you. If you need to lose weight, work with your health care provider to do so safely.  Exercise for at least 30 minutes on 5 or more days each week, or as told by your health care provider.  Avoid wearing clothes that fit tightly around your waist and chest.  Do not use any products that contain nicotine or tobacco, such as cigarettes and e-cigarettes. If you need help quitting, ask your health care provider.  Sleep with the head of your bed raised. Use a wedge under the mattress or blocks under the bed frame to raise the head of the bed. What foods are not recommended? The items listed may not be a complete  list. Talk with your dietitian about what dietary choices are best for you. Grains  Pastries or quick breads with added fat. French toast. Vegetables  Deep fried vegetables. French fries. Any vegetables prepared with added fat. Any vegetables that cause symptoms. For some people this may include tomatoes and tomato products, chili peppers, onions and garlic, and horseradish. Fruits  Any fruits prepared with added fat. Any fruits that cause symptoms. For some people this may include citrus fruits, such as oranges, grapefruit, pineapple, and lemons. Meats and other protein foods  High-fat meats, such as fatty beef or pork, hot dogs, ribs, ham, sausage, salami and bacon. Fried meat or protein, including fried fish and fried chicken. Nuts and nut butters. Dairy  Whole milk and chocolate milk. Sour cream. Cream. Ice cream. Cream cheese. Milk shakes. Beverages  Coffee and tea, with or without caffeine. Carbonated beverages. Sodas. Energy drinks. Fruit juice made with acidic fruits (such as orange or grapefruit). Tomato juice. Alcoholic drinks. Fats and oils  Butter. Margarine. Shortening. Ghee. Sweets and desserts  Chocolate and cocoa. Donuts. Seasoning and other foods  Pepper. Peppermint and spearmint. Any condiments, herbs, or seasonings that cause symptoms. For some people, this may include curry, hot sauce, or vinegar-based salad dressings. Summary  When you have gastroesophageal reflux disease (GERD), food and lifestyle choices are very important to help ease the discomfort of GERD.  Eat frequent, small meals instead of three large meals each day. Eat your meals slowly, in a relaxed setting. Avoid bending over   or lying down until 2-3 hours after eating.  Limit high-fat foods such as fatty meat or fried foods. This information is not intended to replace advice given to you by your health care provider. Make sure you discuss any questions you have with your health care provider. Document  Released: 06/19/2005 Document Revised: 06/20/2016 Document Reviewed: 06/20/2016 Elsevier Interactive Patient Education  2017 [employer] Helicobacter Pylori Infection Helicobacter pylori infection is an infection in the stomach that is caused by the Helicobacter pylori (H. pylori) bacteria. This type of bacteria often lives in the lining of the stomach. The infection can cause ulcers and irritation (gastritis) in some people. It is the most common cause of ulcers in the stomach (gastric ulcer) and in the upper part of the intestine (duodenal ulcer). Having this infection may also increase the risk of stomach cancer and a type of white blood cell cancer (lymphoma) that affects the stomach. What are the causes? H. pylori is a type of bacteria that is often found in the stomachs of healthy people. The bacteria may be passed from person to person through contact with stool or saliva. It is not known why some people develop ulcers, gastritis, or cancer from the infection. What increases the risk? This condition is more likely to develop in people who:  Have family members with the infection.  Live with many other people, such as in a dormitory.  Are of African, Hispanic, or Asian descent. What are the signs or symptoms? Most people with this infection do not have symptoms. If you do have symptoms, they may include:  Heartburn.  Stomach pain.  Nausea.  Vomiting.  Blood-tinged vomit.  Loss of appetite.  Bad breath. How is this diagnosed? This condition may be diagnosed based on your symptoms, a physical exam, and various tests. Tests may include:  Blood tests or stool tests to check for the proteins (antibodies) that your body may produce in response to the bacteria. These tests are the best way to confirm the diagnosis.  A breath test to check for the type of gas that the H. pylori bacteria release after breaking down a substance called urea. For the test, you are asked to drink urea.  This test is often done after treatment in order to find out if the treatment worked.  A procedure in which a thin, flexible tube with a tiny camera at the end is placed into your stomach and upper intestine (upper endoscopy). Your health care provider may also take tissue samples (biopsy) to test for H. pylori and cancer. How is this treated? Treatment for this condition usually involves taking a combination of medicines (triple therapy) for a couple of weeks. Triple therapy includes one medicine to reduce the acid in your stomach and two types of antibiotic medicines. Many drug combinations have been approved for treatment. Treatment usually kills the H. pylori and reduces your risk of cancer. You may need to be tested for H. pylori again after treatment. In some cases, the treatment may need to be repeated. Follow these instructions at home:  Take over-the-counter and prescription medicines only as told by your health care provider.  Take your antibiotics as told by your health care provider. Do not stop taking the antibiotics even if you start to feel better.  You can do all your usual activities and eat what you usually do.  Take steps to prevent future infections:  Wash your hands often.  Make sure the food you eat has been properly prepared.  Drink water only from clean sources.  Keep all follow-up visits as told by your health care provider. This is important. Contact a health care provider if:  Your symptoms do not get better.  Your symptoms return after treatment. This information is not intended to replace advice given to you by your health care provider. Make sure you discuss any questions you have with your health care provider. Document Released: 10/11/2015 Document Revised: 11/25/2015 Document Reviewed: 07/01/2014 Elsevier Interactive Patient Education  2017 [employer].

## 2016-09-27 NOTE — Progress Notes (Signed)
Presents for ultrasound. At annual exam  complained of abdominal bloating, lower pelvic pressure, and generally not feeling well. States unable to wear most of her pants due to tightness. Wearing a sports bra instead of regular bra due to bloating, weight change 1 pound in one year. Reports feeling of fullness/bloating with eating. No change in diet or routine, evening meal at 6:00. No relief with over-the-counter Zantac. LMP December 2017 with numerous hot flushes, poor sleep. Cyclic HRT started last week. Negative colonoscopy. Endometrial ablation years ago, monthly cycles until December.  Exam: Appears well. Ultrasound: T/V and T/A anteverted uterus fibroids intramural 29 x 22 mm, 18 x 10 mm, 20 x 17 mm. Endometrium 11.2 mm positive CFD. Right ovary thick-walled cystic mass irregular fluid-filled 18 x 18 mm positive CFD to periphery consistent with CLC. Right tubular serpentine mass 32 x 23 x 29 mm negative CDS. Left ovary normal. Fluid in cul-de-sac 16 x 16 mm. Positive arterial blood flow to right ovary.  Fibroid uterus Small right ovarian cyst Thickened endometrium Abdominal bloating  Plan: Provera 10 mg by mouth for 10 days reviewed will likely cause menstrual cycle, may help reduce low abdominal fullness/bloating. Will start on Prometrium month of May, continue the Vivelle Dot. Will try Bentyl 20 mg with meals and at bedtime for 2 weeks, follow-up with primary care.

## 2016-10-02 ENCOUNTER — Encounter: Payer: Self-pay | Admitting: Family Medicine

## 2016-11-15 ENCOUNTER — Encounter: Payer: Self-pay | Admitting: Gynecology

## 2016-12-13 ENCOUNTER — Ambulatory Visit (INDEPENDENT_AMBULATORY_CARE_PROVIDER_SITE_OTHER): Payer: BLUE CROSS/BLUE SHIELD | Admitting: Family Medicine

## 2016-12-13 ENCOUNTER — Encounter: Payer: Self-pay | Admitting: Family Medicine

## 2016-12-13 VITALS — BP 117/80 | HR 79 | Temp 98.6°F | Resp 20 | Wt 135.5 lb

## 2016-12-13 DIAGNOSIS — H5712 Ocular pain, left eye: Secondary | ICD-10-CM

## 2016-12-13 MED ORDER — ERYTHROMYCIN 5 MG/GM OP OINT: 1.0000 "application " | TOPICAL_OINTMENT | Freq: Four times a day (QID) | OPHTHALMIC | 0 refills | Status: DC

## 2016-12-13 NOTE — Patient Instructions (Addendum)
I do not see a cause to your eye discomfort.  Ointment for comfort. No contacts Please see your eye doctor to get formal exam with magnification.   If pain worsens, headache, vision changes be seen immediately .

## 2016-12-13 NOTE — Progress Notes (Signed)
Karen Oconnell , June 03, 1966, 51 y.o., female MRN: [id number] Patient Care Team    Relationship Specialty Notifications Start End  McGowen, Adrian Blackwater, MD PCP - General Family Medicine  04/09/14    Comment: patient requests to stay at [facility], DO Consulting Physician Sports Medicine  06/13/16   Armbruster, Renelda Loma, MD Consulting Physician Gastroenterology  07/12/16     Chief Complaint  Patient presents with  . Eye Injury    left 1 week ago      Subjective: Pt presents for an OV with complaints of eye pain of about 1 week ago duration.  Associated symptoms include left eye discomfort. She is a contact wearer, but has not worn her contacts in a week. She denies redness, drainage of the eye. She does feel the eyelid is a little swollen. She denies any known injury to the eye. She states it feels like something is caught under her eyelid. She tried to get into her eye doctor and they could not see her today. She feels like her vision may be mildly affected, it does not appear as clear (but she is in old glasses). She denies globe pain, headache, light sensitivity or fever.   No flowsheet data found.  No Known Allergies Social History  Substance Use Topics  . Smoking status: Never Smoker  . Smokeless tobacco: Never Used  . Alcohol use 0.0 oz/week     Comment: occasionaly   Past Medical History:  Diagnosis Date  . Allergy    seasonal  . Depression   . Fibroid uterus 08/2016   U/s at GYN  . Gestational diabetes   . History of adenomatous polyp of colon 07/2016   Recall 5 yrs (Dr. Havery Moros)  . HSV infection   . Menopausal symptoms    GYN started prometrium and vivelle 08/2016.  Marland Kitchen Neck pain, musculoskeletal 02/06/2011  . 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 Postprandial bloating 08/2016   ? IBS.  GYN MD started Bentyl trial 08/2016.  . 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 Seasonal allergic rhinitis   . Stress fracture 2008   left ankle    Past Surgical History:  Procedure Laterality Date  . BUNIONECTOMY  08/2013   right  . CESAREAN SECTION  1997   X 1  . COLONOSCOPY W/ POLYPECTOMY  07/12/2016   Tubular adenoma: recall 07/2021.  +Diverticulosis L colon.  . cyst removed from back of right leg  6-12  . ENDOMETRIAL ABLATION  2010   HER OPTION ABLATION FOR MENORRHAGIA  . EYE SURGERY  age 47   right eye for strabismus   Family History  Problem Relation Age of Onset  . Diabetes Mother        Type 2  . Hypertension Mother   . Hyperlipidemia Mother   . Depression Mother   . Seasonal affective disorder Father   . Cancer Father        MELANOMA  . Bipolar disorder Brother   . Drug abuse Brother        cocaine, vicodin, pain killers  . ADD / ADHD Son   . Depression Son   . Cancer Maternal Grandmother        gyn possibly cervical or uterine  . Diabetes Maternal Grandfather        Type 2  . Hypertension Maternal Grandfather   . Depression Maternal Grandfather   . Alcohol abuse Paternal Grandfather   . Colon cancer Neg  Hx   . Esophageal cancer Neg Hx   . Rectal cancer Neg Hx   . Stomach cancer Neg Hx    Allergies as of 12/13/2016   No Known Allergies     Medication List       Accurate as of 12/13/16  1:02 PM. Always use your most recent med list.          ALPRAZolam 0.25 MG tablet Commonly known as:  XANAX TAKE 1 TABLET BY MOUTH DAILY AS NEEDED FOR ANXIETY   dicyclomine 20 MG tablet Commonly known as:  BENTYL Take 1 tablet (20 mg total) by mouth 4 (four) times daily -  before meals and at bedtime.   estradiol 0.05 MG/24HR patch Commonly known as:  VIVELLE-DOT Place 1 patch (0.05 mg total) onto the skin 2 (two) times a week.   ibuprofen 200 MG tablet Commonly known as:  ADVIL,MOTRIN Take 600 mg by mouth every 6 (six) hours as needed.   medroxyPROGESTERone 10 MG tablet Commonly known as:  PROVERA Take 1 tablet (10 mg total) by mouth daily.   progesterone 200 MG capsule Commonly known as:   PROMETRIUM Take 1 capsule (200 mg total) by mouth daily. At bedtime day 1-12 of each month   sertraline 100 MG tablet Commonly known as:  ZOLOFT Take 150mg  daily   zolpidem 10 MG tablet Commonly known as:  AMBIEN Take 1 tablet (10 mg total) by mouth at bedtime as needed.       All past medical history, surgical history, allergies, family history, immunizations andmedications were updated in the EMR today and reviewed under the history and medication portions of their EMR.     ROS: Negative, with the exception of above mentioned in HPI   Objective:  BP 117/80 (BP Location: Right Arm, Patient Position: Sitting, Cuff Size: Normal)   Pulse 79   Temp 98.6 F (37 C)   Resp 20   Wt 135 lb 8 oz (61.5 kg)   SpO2 98%   BMI 23.63 kg/m  Body mass index is 23.63 kg/m. Gen: Afebrile. No acute distress. Nontoxic in appearance, well developed, well nourished.  HENT: AT. [place].MMM, no oral lesions.  Eyes:Pupils Equal Round Reactive to light, Extraocular movements intact,  Conjunctiva without redness, discharge or icterus. No Light sensitivity. Fluorescin test negative for laceration. No FB identified. No masses or lesions identified.   No exam data present No results found. No results found for this or any previous visit (from the past 24 hour(s)).  Assessment/Plan: Karen Oconnell is a 51 y.o. female present for OV for  Eye pain, left - could not appreciate any laceration, foreign body or lesion today. Fluorescein and woods lamp used today. Pt states it almost felt better with the exam because her eyes became watery. Unlikely laceration given timeline and negative exam. Pt encouraged to follow up tomorrow with her eye doctor so she can examine  under magnification with tetracaine (which we do not carry).  - OP erythromycin ointment provided since not laceration.  - discussed emergent precautions, which she should be seen in ED or eye doctors office.   Reviewed expectations re: course of  current medical issues.  Discussed self-management of symptoms.  Outlined signs and symptoms indicating need for more acute intervention.  Patient verbalized understanding and all questions were answered.  Patient received an After-Visit Summary.   Note is dictated utilizing voice recognition software. Although note has been proof read prior to signing, occasional typographical errors still can be  missed. If any questions arise, please do not hesitate to call for verification.   electronically signed by:  Howard Pouch, DO  [place]

## 2016-12-14 DIAGNOSIS — H5712 Ocular pain, left eye: Secondary | ICD-10-CM | POA: Diagnosis not present

## 2016-12-14 DIAGNOSIS — S0502XA Injury of conjunctiva and corneal abrasion without foreign body, left eye, initial encounter: Secondary | ICD-10-CM | POA: Diagnosis not present

## 2017-02-07 ENCOUNTER — Other Ambulatory Visit: Payer: Self-pay | Admitting: Women's Health

## 2017-02-07 DIAGNOSIS — F411 Generalized anxiety disorder: Secondary | ICD-10-CM

## 2017-03-12 ENCOUNTER — Other Ambulatory Visit: Payer: Self-pay | Admitting: *Deleted

## 2017-03-12 MED ORDER — ZOLPIDEM TARTRATE 10 MG PO TABS: 10.0000 mg | ORAL_TABLET | Freq: Every evening | ORAL | 1 refills | Status: DC | PRN

## 2017-03-12 NOTE — Telephone Encounter (Signed)
[facility]  RF request for zolpidem LOV: 10/08/15 Next ov: None Last written: 09/25/16 #30 w/ 5RF  Please advise. Thanks.

## 2017-03-12 NOTE — Telephone Encounter (Signed)
Pt advised and voiced understanding. She stated that seh will call back to schedule an apt.

## 2017-03-12 NOTE — Telephone Encounter (Signed)
Will rx 30 day supply with 1 RF. Pt needs either a f/u insomnia visit OR a CPE visit before any FURTHER Lorrin Mais can be rx'd.-thx

## 2017-03-13 NOTE — Telephone Encounter (Signed)
Rx faxed

## 2017-04-25 ENCOUNTER — Other Ambulatory Visit: Payer: Self-pay | Admitting: Women's Health

## 2017-04-25 DIAGNOSIS — Z1231 Encounter for screening mammogram for malignant neoplasm of breast: Secondary | ICD-10-CM

## 2017-04-26 ENCOUNTER — Encounter: Payer: Self-pay | Admitting: Family Medicine

## 2017-04-26 ENCOUNTER — Ambulatory Visit (INDEPENDENT_AMBULATORY_CARE_PROVIDER_SITE_OTHER): Payer: BLUE CROSS/BLUE SHIELD | Admitting: Family Medicine

## 2017-04-26 VITALS — BP 122/78 | HR 86 | Temp 98.5°F | Resp 16 | Ht 63.5 in | Wt 135.5 lb

## 2017-04-26 DIAGNOSIS — Z87898 Personal history of other specified conditions: Secondary | ICD-10-CM | POA: Diagnosis not present

## 2017-04-26 DIAGNOSIS — Z0000 Encounter for general adult medical examination without abnormal findings: Secondary | ICD-10-CM

## 2017-04-26 DIAGNOSIS — Z8742 Personal history of other diseases of the female genital tract: Secondary | ICD-10-CM

## 2017-04-26 DIAGNOSIS — Z23 Encounter for immunization: Secondary | ICD-10-CM

## 2017-04-26 DIAGNOSIS — N920 Excessive and frequent menstruation with regular cycle: Secondary | ICD-10-CM | POA: Diagnosis not present

## 2017-04-26 NOTE — Addendum Note (Signed)
Addended by: Onalee Hua on: 04/26/2017 03:42 PM   Modules accepted: Orders

## 2017-04-26 NOTE — Progress Notes (Signed)
Office Note 04/26/2017  CC:  Chief Complaint  Patient presents with  . Annual Exam    Pt is not fasting.     HPI:  Karen Oconnell is a 51 y.o. White female who is here for annual health maintenance exam. GYN: Elon Alas, NP with Dr. Sandrea Hughs office.   Pt requests referral to new GYN MD at this time.  Having regular menses, but very heavy x 3-4 days.  Hx of same in past, got endomet ablation.  It helped for a couple of years.    Past Medical History:  Diagnosis Date  . Allergy    seasonal  . Depression   . Fibroid uterus 08/2016   U/s at GYN  . Gestational diabetes   . History of adenomatous polyp of colon 07/2016   Recall 5 yrs (Dr. Havery Moros)  . HSV infection   . Menopausal symptoms    GYN started prometrium and vivelle 08/2016.  Marland Kitchen Neck pain, musculoskeletal 02/06/2011  . 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 Postprandial bloating 08/2016   ? IBS.  GYN MD started Bentyl trial 08/2016.  . 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 Seasonal allergic rhinitis   . Stress fracture 2008   left ankle    Past Surgical History:  Procedure Laterality Date  . BUNIONECTOMY  08/2013   right  . CESAREAN SECTION  1997   X 1  . COLONOSCOPY W/ POLYPECTOMY  07/12/2016   Tubular adenoma: recall 07/2021.  +Diverticulosis L colon.  . cyst removed from back of right leg  6-12  . ENDOMETRIAL ABLATION  2010   HER OPTION ABLATION FOR MENORRHAGIA  . EYE SURGERY  age 54   right eye for strabismus    Family History  Problem Relation Age of Onset  . Diabetes Mother        Type 2  . Hypertension Mother   . Hyperlipidemia Mother   . Depression Mother   . Seasonal affective disorder Father   . Cancer Father        MELANOMA  . Bipolar disorder Brother   . Drug abuse Brother        cocaine, vicodin, pain killers  . ADD / ADHD Son   . Depression Son   . Cancer Maternal Grandmother        gyn possibly cervical or uterine  . Diabetes Maternal Grandfather        Type  2  . Hypertension Maternal Grandfather   . Depression Maternal Grandfather   . Alcohol abuse Paternal Grandfather   . Colon cancer Neg Hx   . Esophageal cancer Neg Hx   . Rectal cancer Neg Hx   . Stomach cancer Neg Hx     Social History   Social History  . Marital status: Married    Spouse name: N/A  . Number of children: N/A  . Years of education: N/A   Occupational History  . Not on file.   Social History Main Topics  . Smoking status: Never Smoker  . Smokeless tobacco: Never Used  . Alcohol use 0.0 oz/week     Comment: occasionaly  . Drug use: No  . Sexual activity: Yes    Partners: Male    Birth control/ protection: Other-see comments, Surgical     Comment: husband vasectomy   Other Topics Concern  . 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   No T/A/Ds.  Outpatient Medications Prior to Visit  Medication Sig Dispense Refill  . ibuprofen (ADVIL,MOTRIN) 200 MG tablet Take 600 mg by mouth every 6 (six) hours as needed.      . zolpidem (AMBIEN) 10 MG tablet Take 1 tablet (10 mg total) by mouth at bedtime as needed. 30 tablet 1  . ALPRAZolam (XANAX) 0.25 MG tablet TAKE 1 TABLET BY MOUTH DAILY AS NEEDED FOR ANXIETY (Patient not taking: Reported on 04/26/2017) 30 tablet 5  . dicyclomine (BENTYL) 20 MG tablet Take 1 tablet (20 mg total) by mouth 4 (four) times daily -  before meals and at bedtime. (Patient not taking: Reported on 04/26/2017) 60 tablet 0  . erythromycin ophthalmic ointment Place 1 application into the left eye 4 (four) times daily. Place thin ribbon of ointment in eye (Patient not taking: Reported on 04/26/2017) 3.5 g 0  . estradiol (VIVELLE-DOT) 0.05 MG/24HR patch Place 1 patch (0.05 mg total) onto the skin 2 (two) times a week. (Patient not taking: Reported on 04/26/2017) 24 patch 4  . medroxyPROGESTERone (PROVERA) 10 MG tablet Take 1 tablet (10 mg total) by mouth daily. (Patient not taking:  Reported on 04/26/2017) 10 tablet 0  . progesterone (PROMETRIUM) 200 MG capsule Take 1 capsule (200 mg total) by mouth daily. At bedtime day 1-12 of each month (Patient not taking: Reported on 04/26/2017) 36 capsule 4  . sertraline (ZOLOFT) 100 MG tablet Take 150mg  daily (Patient not taking: Reported on 04/26/2017) 90 tablet 4  . sertraline (ZOLOFT) 100 MG tablet TAKE 1 AND 1/2 TABLETS BY MOUTH DAILY (Patient not taking: Reported on 04/26/2017) 90 tablet 3   Facility-Administered Medications Prior to Visit  Medication Dose Route Frequency Provider Last Rate Last Dose  . 0.9 %  sodium chloride infusion  500 mL Intravenous Continuous Armbruster, Carlota Raspberry, MD        No Known Allergies  ROS Review of Systems  Constitutional: Negative for appetite change, chills, fatigue and fever.  HENT: Negative for congestion, dental problem, ear pain and sore throat.   Eyes: Negative for discharge, redness and visual disturbance.  Respiratory: Negative for cough, chest tightness, shortness of breath and wheezing.   Cardiovascular: Negative for chest pain, palpitations and leg swelling.  Gastrointestinal: Negative for abdominal pain, blood in stool, diarrhea, nausea and vomiting.  Genitourinary: Positive for vaginal bleeding (see hpi). Negative for difficulty urinating, dysuria, flank pain, frequency, hematuria and urgency.  Musculoskeletal: Negative for arthralgias, back pain, joint swelling, myalgias and neck stiffness.  Skin: Negative for pallor and rash.  Neurological: Negative for dizziness, speech difficulty, weakness and headaches.  Hematological: Negative for adenopathy. Does not bruise/bleed easily.  Psychiatric/Behavioral: Negative for confusion and sleep disturbance. The patient is not nervous/anxious.     PE; Blood pressure 122/78, pulse 86, temperature 98.5 F (36.9 C), temperature source Oral, resp. rate 16, height 5' 3.5" (1.613 m), weight 135 lb 8 oz (61.5 kg), last menstrual period  04/14/2017, SpO2 97 %. Body mass index is 23.63 kg/m. Exam chaperoned by female CMA. Gen: Alert, well appearing.  Patient is oriented to person, place, time, and situation. AFFECT: pleasant, lucid thought and speech. ENT: Ears: EACs clear, normal epithelium.  TMs with good light reflex and landmarks bilaterally.  Eyes: no injection, icteris, swelling, or exudate.  EOMI, PERRLA. Nose: no drainage or turbinate edema/swelling.  No injection or focal lesion.  Mouth: lips without lesion/swelling.  Oral mucosa pink and moist.  Dentition intact and without obvious caries or gingival swelling.  Oropharynx without  erythema, exudate, or swelling.  Neck: supple/nontender.  No LAD, mass, or TM.  Carotid pulses 2+ bilaterally, without bruits. CV: RRR, no m/r/g.   LUNGS: CTA bilat, nonlabored resps, good aeration in all lung fields. ABD: soft, mild diffuse lower abd tenderness to palpation, ND, BS normal.  No hepatospenomegaly or mass.  No bruits. EXT: no clubbing, cyanosis, or edema.  Musculoskeletal: no joint swelling, erythema, warmth, or tenderness.  ROM of all joints intact. Skin - no sores or suspicious lesions or rashes or color changes   Pertinent labs:  Lab Results  Component Value Date   TSH 0.64 10/08/2015   Lab Results  Component Value Date   WBC 5.5 10/08/2015   HGB 13.6 10/08/2015   HCT 40.6 10/08/2015   MCV 86.8 10/08/2015   PLT 349.0 10/08/2015   Lab Results  Component Value Date   CREATININE 0.68 10/08/2015   BUN 11 10/08/2015   NA 136 10/08/2015   K 4.5 10/08/2015   CL 102 10/08/2015   CO2 28 10/08/2015   Lab Results  Component Value Date   ALT 11 10/08/2015   AST 15 10/08/2015   ALKPHOS 43 10/08/2015   BILITOT 0.5 10/08/2015   Lab Results  Component Value Date   CHOL 235 (H) 10/08/2015   Lab Results  Component Value Date   HDL 85.30 10/08/2015   Lab Results  Component Value Date   LDLCALC 134 (H) 10/08/2015   Lab Results  Component Value Date   TRIG  77.0 10/08/2015   Lab Results  Component Value Date   CHOLHDL 3 10/08/2015    ASSESSMENT AND PLAN:   Health maintenance exam: Reviewed age and gender appropriate health maintenance issues (prudent diet, regular exercise, health risks of tobacco and excessive alcohol, use of seatbelts, fire alarms in home, use of sunscreen).  Also reviewed age and gender appropriate health screening as well as vaccine recommendations. Vaccines: Tdap UTD.  Flu vaccine--given today.  Shingrix discussed. Labs: Fasting HP labs--return for these when fasting. Cervical ca screening: UTD.  Most recent pap was 09/20/16.  Has hx of abnormal paps, also remote hx of colposcopy. Per pt request will refer to new GYN today- for establishing care and further addressing her menorrhagia. Breast ca screening: last mammo 05/2016.  Screening mammogram has been ordered already by Elon Alas, NP. Colon ca screening/hx of adenomatous colon polyp: needs repeat colonoscopy 07/2021.  An After Visit Summary was printed and given to the patient.  FOLLOW UP:  Return in about 6 months (around 10/25/2017) for routine chronic illness f/u.  Signed:  Crissie Sickles, MD           04/26/2017

## 2017-04-26 NOTE — Patient Instructions (Signed)

## 2017-04-27 ENCOUNTER — Other Ambulatory Visit (INDEPENDENT_AMBULATORY_CARE_PROVIDER_SITE_OTHER): Payer: BLUE CROSS/BLUE SHIELD

## 2017-04-27 DIAGNOSIS — Z0000 Encounter for general adult medical examination without abnormal findings: Secondary | ICD-10-CM | POA: Diagnosis not present

## 2017-04-27 LAB — COMPREHENSIVE METABOLIC PANEL
ALT: 13 U/L (ref 0–35)
AST: 15 U/L (ref 0–37)
Albumin: 4.3 g/dL (ref 3.5–5.2)
Alkaline Phosphatase: 44 U/L (ref 39–117)
BUN: 12 mg/dL (ref 6–23)
CO2: 29 mEq/L (ref 19–32)
Calcium: 9.4 mg/dL (ref 8.4–10.5)
Chloride: 104 mEq/L (ref 96–112)
Creatinine, Ser: 0.67 mg/dL (ref 0.40–1.20)
GFR: 98.44 mL/min (ref >60.00)
Glucose, Bld: 108 mg/dL — ABNORMAL HIGH (ref 70–99)
Potassium: 4.6 mEq/L (ref 3.5–5.1)
Sodium: 139 mEq/L (ref 135–145)
Total Bilirubin: 0.5 mg/dL (ref 0.2–1.2)
Total Protein: 6.5 g/dL (ref 6.0–8.3)

## 2017-04-27 LAB — CBC WITH DIFFERENTIAL/PLATELET
Basophils Absolute: 0.1 10*3/uL (ref 0.0–0.1)
Basophils Relative: 1 % (ref 0.0–3.0)
Eosinophils Absolute: 0 10*3/uL (ref 0.0–0.7)
Eosinophils Relative: 0.7 % (ref 0.0–5.0)
HCT: 40.6 % (ref 36.0–46.0)
Hemoglobin: 13.4 g/dL (ref 12.0–15.0)
Lymphocytes Relative: 16.4 % (ref 12.0–46.0)
Lymphs Abs: 1.1 10*3/uL (ref 0.7–4.0)
MCHC: 33 g/dL (ref 30.0–36.0)
MCV: 90.6 fl (ref 78.0–100.0)
Monocytes Absolute: 0.4 10*3/uL (ref 0.1–1.0)
Monocytes Relative: 5.9 % (ref 3.0–12.0)
Neutro Abs: 5.3 10*3/uL (ref 1.4–7.7)
Neutrophils Relative %: 76 % (ref 43.0–77.0)
Platelets: 368 10*3/uL (ref 150.0–400.0)
RBC: 4.48 Mil/uL (ref 3.87–5.11)
RDW: 14.2 % (ref 11.5–15.5)
WBC: 7 10*3/uL (ref 4.0–10.5)

## 2017-04-27 LAB — LIPID PANEL
Cholesterol: 192 mg/dL (ref 0–200)
HDL: 79.3 mg/dL (ref >39.00)
LDL Cholesterol: 101 mg/dL — ABNORMAL HIGH (ref 0–99)
NonHDL: 113.02
Total CHOL/HDL Ratio: 2
Triglycerides: 60 mg/dL (ref 0.0–149.0)
VLDL: 12 mg/dL (ref 0.0–40.0)

## 2017-04-27 LAB — TSH: TSH: 0.55 u[IU]/mL (ref 0.35–4.50)

## 2017-05-14 ENCOUNTER — Other Ambulatory Visit: Payer: Self-pay | Admitting: *Deleted

## 2017-05-14 MED ORDER — ZOLPIDEM TARTRATE 10 MG PO TABS: 10.0000 mg | ORAL_TABLET | Freq: Every evening | ORAL | 5 refills | Status: DC | PRN

## 2017-05-14 NOTE — Telephone Encounter (Signed)
[facility]   RF request for zolpidem LOV: 04/26/17 Next ov: None Last written: 03/12/17 #30 w/ 1RF  Please advise. Thanks.

## 2017-05-14 NOTE — Telephone Encounter (Signed)
Rx faxed

## 2017-05-15 ENCOUNTER — Ambulatory Visit
Admission: RE | Admit: 2017-05-15 | Discharge: 2017-05-15 | Disposition: A | Discharge status: Home / Self Care | Payer: BLUE CROSS/BLUE SHIELD | Source: Ambulatory Visit | Attending: Women's Health | Admitting: Women's Health

## 2017-05-15 DIAGNOSIS — Z1231 Encounter for screening mammogram for malignant neoplasm of breast: Secondary | ICD-10-CM | POA: Diagnosis not present

## 2017-05-16 ENCOUNTER — Encounter: Payer: Self-pay | Admitting: Family Medicine

## 2017-05-16 ENCOUNTER — Encounter: Payer: Self-pay | Admitting: Women's Health

## 2017-06-21 DIAGNOSIS — G5762 Lesion of plantar nerve, left lower limb: Secondary | ICD-10-CM | POA: Diagnosis not present

## 2017-06-21 DIAGNOSIS — M19071 Primary osteoarthritis, right ankle and foot: Secondary | ICD-10-CM | POA: Diagnosis not present

## 2017-06-21 DIAGNOSIS — M7752 Other enthesopathy of left foot: Secondary | ICD-10-CM | POA: Diagnosis not present

## 2017-06-21 DIAGNOSIS — G5790 Unspecified mononeuropathy of unspecified lower limb: Secondary | ICD-10-CM | POA: Diagnosis not present

## 2017-06-21 DIAGNOSIS — M659 Synovitis and tenosynovitis, unspecified: Secondary | ICD-10-CM | POA: Diagnosis not present

## 2017-06-21 DIAGNOSIS — M19072 Primary osteoarthritis, left ankle and foot: Secondary | ICD-10-CM | POA: Diagnosis not present

## 2017-06-28 ENCOUNTER — Ambulatory Visit: Payer: BLUE CROSS/BLUE SHIELD | Admitting: Family Medicine

## 2017-06-28 ENCOUNTER — Encounter: Payer: Self-pay | Admitting: Family Medicine

## 2017-06-28 VITALS — BP 116/64 | HR 76 | Temp 98.5°F | Ht 63.5 in | Wt 133.0 lb

## 2017-06-28 DIAGNOSIS — R300 Dysuria: Secondary | ICD-10-CM | POA: Diagnosis not present

## 2017-06-28 LAB — POCT URINALYSIS DIPSTICK
Bilirubin, UA: NEGATIVE
Blood, UA: NEGATIVE
Glucose, UA: NEGATIVE
Ketones, UA: NEGATIVE
Leukocytes, UA: NEGATIVE
Nitrite, UA: NEGATIVE
Protein, UA: NEGATIVE
Spec Grav, UA: >=1.03 — AB (ref 1.010–1.025)
Urobilinogen, UA: 0.2 E.U./dL
pH, UA: 5.5 (ref 5.0–8.0)

## 2017-06-28 MED ORDER — CEPHALEXIN 500 MG PO CAPS: 500.0000 mg | ORAL_CAPSULE | Freq: Two times a day (BID) | ORAL | 0 refills | Status: AC

## 2017-06-28 NOTE — Patient Instructions (Signed)
Start the keflex. We will call you with the results of your urine culture.   Urinary Tract Infection, Adult A urinary tract infection (UTI) is an infection of any part of the urinary tract. The urinary tract includes the:  Kidneys.  Ureters.  Bladder.  Urethra.  These organs make, store, and get rid of pee (urine) in the body. Follow these instructions at home:  Take over-the-counter and prescription medicines only as told by your doctor.  If you were prescribed an antibiotic medicine, take it as told by your doctor. Do not stop taking the antibiotic even if you start to feel better.  Avoid the following drinks: ? Alcohol. ? Caffeine. ? Tea. ? Carbonated drinks.  Drink enough fluid to keep your pee clear or pale yellow.  Keep all follow-up visits as told by your doctor. This is important.  Make sure to: ? Empty your bladder often and completely. Do not to hold pee for long periods of time. ? Empty your bladder before and after sex. ? Wipe from front to back after a bowel movement if you are female. Use each tissue one time when you wipe. Contact a doctor if:  You have back pain.  You have a fever.  You feel sick to your stomach (nauseous).  You throw up (vomit).  Your symptoms do not get better after 3 days.  Your symptoms go away and then come back. Get help right away if:  You have very bad back pain.  You have very bad lower belly (abdominal) pain.  You are throwing up and cannot keep down any medicines or water. This information is not intended to replace advice given to you by your health care provider. Make sure you discuss any questions you have with your health care provider. Document Released: 12/06/2007 Document Revised: 11/25/2015 Document Reviewed: 05/10/2015 Elsevier Interactive Patient Education  [employer].

## 2017-06-28 NOTE — Progress Notes (Signed)
    Subjective:  Karen Oconnell is a 51 y.o. female who presents today with a chief complaint of dysuria.   HPI:  Dysuria, Acute issue Symptoms started 2 days ago.  Worsened over the last day.  Also with lower abdominal pain and increased frequency. No fevers. No chills. Tried cranberry pills and ibuprofen which did not help much.  No other obvious alleviating or aggravating factors.  Notes that her current symptoms feel similar to her past urinary tract infections.  ROS: Per HPI  PMH: Smoking history reviewed.  Never smoker.  Objective:  Physical Exam: BP 116/64 (BP Location: Left Arm, Patient Position: Sitting, Cuff Size: Normal)   Pulse 76   Temp 98.5 F (36.9 C) (Oral)   Ht 5' 3.5" (1.613 m)   Wt 133 lb (60.3 kg)   SpO2 98%   BMI 23.19 kg/m   Gen: NAD, resting comfortably CV: RRR with no murmurs appreciated Pulm: NWOB, CTAB with no crackles, wheezes, or rhonchi GI: Normal bowel sounds present. Soft, mildly tender to suprapubic area.  No rebound or guarding. MSK: No CVA tenderness.  Results for orders placed or performed in visit on 06/28/17 (from the past 72 hour(s))  POCT urinalysis dipstick     Status: Abnormal   Collection Time: 06/28/17 12:01 PM  Result Value Ref Range   Color, UA Yellow    Clarity, UA Clear    Glucose, UA Negative    Bilirubin, UA Negative    Ketones, UA Negative    Spec Grav, UA >=1.030 (A) 1.010 - 1.025   Blood, UA Negative    pH, UA 5.5 5.0 - 8.0   Protein, UA Negative    Urobilinogen, UA 0.2 0.2 or 1.0 E.U./dL   Nitrite, UA Negative    Leukocytes, UA Negative Negative   Appearance     Odor      Assessment/Plan:  Dysuria UA without clear signs of urinary tract infection however symptoms are consistent with this diagnosis.  We will empirically start Keflex 500 mg twice daily for 7-day course.  We will check a urine culture.  Encouraged patient to stay well-hydrated over the next 1-2 weeks.  Return precautions reviewed including  failure of symptoms to improve with antibiotic therapy, development of fevers/chills/Rigors, development of nausea/vomiting, or development of abdominal pain.  Follow-up as needed.  Algis Greenhouse. Jerline Pain, MD 06/28/2017 12:03 PM

## 2017-06-29 LAB — URINE CULTURE
MICRO NUMBER:: 81452492
Result:: NO GROWTH
SPECIMEN QUALITY:: ADEQUATE

## 2017-07-02 ENCOUNTER — Encounter: Payer: Self-pay | Admitting: Family Medicine

## 2017-07-02 NOTE — Progress Notes (Signed)
Urine culture negative. She can stop her antibiotic. IF she is still having symptoms, she should follow up with her PCP.

## 2017-07-04 ENCOUNTER — Telehealth: Payer: Self-pay | Admitting: *Deleted

## 2017-07-04 NOTE — Telephone Encounter (Signed)
Copied from [place]. Topic: Referral - Request >> Jul 04, 2017  3:13 PM Synthia Innocent wrote: Reason for CRM: Requesting GI referral for abdominal pain and swelling, saw Dr Jerline Pain on 06/28/17. Please advise

## 2017-07-04 NOTE — Telephone Encounter (Signed)
Pt was seen by Dr. Jerline Pain at [facility] and was advised pt f/u with PCP regarding abdominal pain/swelling.  Pt advised and voiced understanding.  Apt made for 07/06/17 at 2:45pm.

## 2017-07-06 ENCOUNTER — Encounter: Payer: Self-pay | Admitting: Family Medicine

## 2017-07-06 ENCOUNTER — Ambulatory Visit: Payer: BLUE CROSS/BLUE SHIELD | Admitting: Family Medicine

## 2017-07-06 VITALS — BP 122/70 | HR 76 | Temp 98.2°F

## 2017-07-06 DIAGNOSIS — R102 Pelvic and perineal pain: Secondary | ICD-10-CM

## 2017-07-06 DIAGNOSIS — N920 Excessive and frequent menstruation with regular cycle: Secondary | ICD-10-CM | POA: Diagnosis not present

## 2017-07-06 DIAGNOSIS — R1084 Generalized abdominal pain: Secondary | ICD-10-CM | POA: Diagnosis not present

## 2017-07-06 MED ORDER — TRAMADOL HCL 50 MG PO TABS: 50.0000 mg | ORAL_TABLET | Freq: Four times a day (QID) | ORAL | 0 refills | Status: DC | PRN

## 2017-07-06 MED ORDER — OMEPRAZOLE 20 MG PO CPDR: 20.0000 mg | DELAYED_RELEASE_CAPSULE | Freq: Every day | ORAL | 3 refills | Status: DC

## 2017-07-06 NOTE — Patient Instructions (Signed)
Please schedule an appointment with Dr. Anitra Lauth for recheck after our ultrasound.  We will call you with information regarding your referral appointment. Pelvic ultrasound  I will release your lab results to you on your MyChart account with further instructions. Please reply with any questions.   Start the prilosec daily for abdominal pain associated with eating.  Stop all ibuprofen and nsaids.  You may use the tramadol for pelvic pain if needed.

## 2017-07-06 NOTE — Progress Notes (Signed)
Subjective  CC:  Chief Complaint  Patient presents with  . Low pelvic pain  . Back Pain  . Constipation and Diarrhea    off and on x several weeks    HPI: Karen Oconnell is a 52 y.o. female who presents to the office today to address the problems listed above in the chief complaint.  52 year old new patient to me presents for persistent symptoms that include lower pelvic pain, abdominal bloating with postprandial pain, intermittent constipation alternating with diarrhea, and heavy menstrual cycles.  I reviewed her old medical records from her gynecologist, PCP and recent same-day visit.  She was seen December 27 for pelvic pain and urinary pressure.  She was treated for urinary tract infection until the urine culture returned negative.  She still reports that she is not feeling well and her symptoms are somewhat chronic in nature at this point.  It sounds as if she was seen early last year with her gynecologist for heavy painful menstrual cycles.  An ultrasound at that time did show a fibroid uterus.  She was placed on HRT although she is not certain that these helped.  Then more recently she was seen for physical and complained of some abdominal complaints as described above.  Now she reports with her last menstrual cycle she has had significant pelvic pain and cramping.  But she also reports moderate to severe pain after meals, however it is not epigastric it is lower in the abdomen and associated with bloating.  As well she had diarrhea during her menstrual cycle although at times she has been constipated.  She denies blood in the stool melena, weight changes, vaginal discharge, urinary symptoms, fevers, chills.  She has been stressed over the holidays.  She does admit to frequent ibuprofen use.  She has been using H2 inhibitors over the last week without significant change in her symptoms.  I reviewed the patients updated PMH, FH, and SocHx.    Patient Active Problem List   Diagnosis Date  Noted  . SI (sacroiliac) joint dysfunction 06/12/2016  . Nonallopathic lesion of sacral region 06/12/2016  . Nonallopathic lesion of lumbosacral region 06/12/2016  . Nonallopathic lesion of thoracic region 06/12/2016  . Maxillary sinusitis 06/15/2015  . Palpitations 04/19/2014  . Rotator cuff tendonitis 04/19/2014  . Health maintenance examination 04/09/2014  . Eustachian tube dysfunction 08/21/2013  . Mass of breast, right 04/11/2012  . Mastodynia, female 04/11/2012  . Allergy   . Gestational diabetes   . HSV infection   . Neck pain, musculoskeletal 02/06/2011  . Otitis externa 01/16/2011  . ANXIETY DEPRESSION 07/05/2010   Current Meds  Medication Sig  . ibuprofen (ADVIL,MOTRIN) 200 MG tablet Take 600 mg by mouth every 6 (six) hours as needed.    . zolpidem (AMBIEN) 10 MG tablet Take 1 tablet (10 mg total) at bedtime as needed by mouth.   Current Facility-Administered Medications for the 07/06/17 encounter (Office Visit) with Leamon Arnt, MD  Medication  . 0.9 %  sodium chloride infusion    Allergies: Patient has No Known Allergies. Family History: Patient family history includes ADD / ADHD in her son; Alcohol abuse in her paternal grandfather; Bipolar disorder in her brother; Cancer in her father and maternal grandmother; Depression in her maternal grandfather, mother, and son; Diabetes in her maternal grandfather and mother; Drug abuse in her brother; Hyperlipidemia in her mother; Hypertension in her maternal grandfather and mother; Seasonal affective disorder in her father. Social History:  Patient  reports that  has never smoked. she has never used smokeless tobacco. She reports that she drinks alcohol. She reports that she does not use drugs.  Review of Systems: Constitutional: Negative for fever malaise or anorexia Cardiovascular: negative for chest pain Respiratory: negative for SOB or persistent cough Gastrointestinal: negative for nausea vomiting.  Objective    Vitals: BP 122/70 (BP Location: Right Arm, Patient Position: Sitting, Cuff Size: Normal)   Pulse 76   Temp 98.2 F (36.8 C) (Oral)   LMP 06/28/2017   SpO2 98%  General: no acute distress , A&Ox3, mildly frustrated HEENT: PEERL, conjunctiva normal, Oropharynx moist,neck is supple, no lymphadenopathy Cardiovascular:  RRR without murmur or gallop.  Respiratory:  Good breath sounds bilaterally, CTAB with normal respiratory effort Gastrointestinal: Soft, normal bowel sounds, diffuse tenderness without rebound guarding or masses GYN: Tender uterine fundus, normal adnexa, no CMT Skin:  Warm, no rashes  Assessment  1. Pelvic pain   2. Generalized postprandial abdominal pain   3. Menorrhagia with regular cycle      Plan   Pelvic pain and abdominal pain: Putting the picture together, it sounds like there are 2 separate issues going on.  We discussed both  Pelvic pain most likely related to fibroid uterus.  Treat with tramadol.  Patient agrees to repeat pelvic ultrasound.  She has been referred to a new gynecologist for further treatment options and discussion.  We discussed the diagnosis.  I suspect the change in bowel habits is also related to her menstrual cycles, diarrhea being prominent.  I suspect gastritis or possibly peptic ulcer causing postprandial pain and this may be caused by recent NSAID use.  Stop all NSAIDs, start Prilosec daily.  Follow-up with PCP.  Reassured, no red flag symptoms are present.  Check labs to ensure no other etiologies are identified.  Follow up: Follow-up with PCP for further review and evaluation.  Follow-up with gynecologist as scheduled   Commons side effects, risks, benefits, and alternatives for medications and treatment plan prescribed today were discussed, and the patient expressed understanding of the given instructions. Patient is instructed to call or message via MyChart if he/she has any questions or concerns regarding our treatment plan. No  barriers to understanding were identified. We discussed Red Flag symptoms and signs in detail. Patient expressed understanding regarding what to do in case of urgent or emergency type symptoms.   Medication list was reconciled, printed and provided to the patient in AVS. Patient instructions and summary information was reviewed with the patient as documented in the AVS. This note was prepared with assistance of Dragon voice recognition software. Occasional wrong-word or sound-a-like substitutions may have occurred due to the inherent limitations of voice recognition software  Orders Placed This Encounter  Procedures  . US Transvaginal Non-OB  . CBC with Differential/Platelet  . Comprehensive metabolic panel  . H. pylori antibody, IgG   Meds ordered this encounter  Medications  . traMADol (ULTRAM) 50 MG tablet    Sig: Take 1 tablet (50 mg total) by mouth every 6 (six) hours as needed for moderate pain.    Dispense:  30 tablet    Refill:  0  . omeprazole (PRILOSEC) 20 MG capsule    Sig: Take 1 capsule (20 mg total) by mouth daily.    Dispense:  30 capsule    Refill:  3

## 2017-07-06 NOTE — Progress Notes (Deleted)
OFFICE VISIT  07/06/2017   CC: No chief complaint on file.    HPI:    Patient is a 52 y.o. Caucasian female with a PMH of postprandial bloating that is suspicious for IBS, who presents for abdominal complaints.     Pelvic u/s 09/27/16 (done by her GYN): Results: T/V and T/A anteverted uterus fibroids intramural 29 x 22 mm, 18 x 10 mm, 20 x 17 mm. Thickened endometrium positive CFD 11.2 mm. Right ovary thick-walled cystic mass irregular fluid filled 18 x 18 mm positive CFD periphery C/W CLC. Right tubular serpentine mass 32 x 23 x 29 mm negative CFD.  Left ovary normal. Fluid in cul-de-sac 16 x 16 mm. Positive arterial blood flow to right ovary.   Past Medical History:  Diagnosis Date  . Allergy    seasonal  . Depression   . Fibroid uterus 08/2016   U/s at GYN  . Gestational diabetes   . History of adenomatous polyp of colon 07/2016   Recall 5 yrs (Dr. Havery Moros)  . HSV infection   . Menopausal symptoms    GYN started prometrium and vivelle 08/2016.  Marland Kitchen Neck pain, musculoskeletal 02/06/2011  . 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 Postprandial bloating 08/2016   ? IBS.  GYN MD started Bentyl trial 08/2016.  . 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 Seasonal allergic rhinitis   . Stress fracture 2008   left ankle    Past Surgical History:  Procedure Laterality Date  . BUNIONECTOMY  08/2013   right  . CESAREAN SECTION  1997   X 1  . COLONOSCOPY W/ POLYPECTOMY  07/12/2016   Tubular adenoma: recall 07/2021.  +Diverticulosis L colon.  . cyst removed from back of right leg  6-12  . ENDOMETRIAL ABLATION  2010   HER OPTION ABLATION FOR MENORRHAGIA  . EYE SURGERY  age 14   right eye for strabismus    Outpatient Medications Prior to Visit  Medication Sig Dispense Refill  . ibuprofen (ADVIL,MOTRIN) 200 MG tablet Take 600 mg by mouth every 6 (six) hours as needed.      . zolpidem (AMBIEN) 10 MG tablet Take 1 tablet (10 mg total) at bedtime as needed by mouth. 30  tablet 5   Facility-Administered Medications Prior to Visit  Medication Dose Route Frequency Provider Last Rate Last Dose  . 0.9 %  sodium chloride infusion  500 mL Intravenous Continuous Armbruster, Carlota Raspberry, MD        No Known Allergies  ROS As per HPI  PE: There were no vitals taken for this visit. ***  LABS:    Chemistry      Component Value Date/Time   NA 139 04/27/2017 0801   K 4.6 04/27/2017 0801   CL 104 04/27/2017 0801   CO2 29 04/27/2017 0801   BUN 12 04/27/2017 0801   CREATININE 0.67 04/27/2017 0801      Component Value Date/Time   CALCIUM 9.4 04/27/2017 0801   ALKPHOS 44 04/27/2017 0801   AST 15 04/27/2017 0801   ALT 13 04/27/2017 0801   BILITOT 0.5 04/27/2017 0801     Lab Results  Component Value Date   WBC 7.0 04/27/2017   HGB 13.4 04/27/2017   HCT 40.6 04/27/2017   MCV 90.6 04/27/2017   PLT 368.0 04/27/2017     IMPRESSION AND PLAN:  No problem-specific Assessment & Plan notes found for this encounter.   FOLLOW UP: No Follow-up on file.

## 2017-07-07 LAB — CBC WITH DIFFERENTIAL/PLATELET
Basophils Absolute: 39 cells/uL (ref 0–200)
Basophils Relative: 0.5 %
Eosinophils Absolute: 62 cells/uL (ref 15–500)
Eosinophils Relative: 0.8 %
HCT: 38.5 % (ref 35.0–45.0)
Hemoglobin: 13.2 g/dL (ref 11.7–15.5)
Lymphs Abs: 2533 cells/uL (ref 850–3900)
MCH: 29.5 pg (ref 27.0–33.0)
MCHC: 34.3 g/dL (ref 32.0–36.0)
MCV: 86.1 fL (ref 80.0–100.0)
MPV: 10.1 fL (ref 7.5–12.5)
Monocytes Relative: 6.2 %
Neutro Abs: 4589 cells/uL (ref 1500–7800)
Neutrophils Relative %: 59.6 %
Platelets: 413 10*3/uL — ABNORMAL HIGH (ref 140–400)
RBC: 4.47 10*6/uL (ref 3.80–5.10)
RDW: 12.4 % (ref 11.0–15.0)
Total Lymphocyte: 32.9 %
WBC mixed population: 477 cells/uL (ref 200–950)
WBC: 7.7 10*3/uL (ref 3.8–10.8)

## 2017-07-07 LAB — COMPREHENSIVE METABOLIC PANEL
AG Ratio: 1.8 (calc) (ref 1.0–2.5)
ALT: 14 U/L (ref 6–29)
AST: 19 U/L (ref 10–35)
Albumin: 4.5 g/dL (ref 3.6–5.1)
Alkaline phosphatase (APISO): 43 U/L (ref 33–130)
BUN: 7 mg/dL (ref 7–25)
CO2: 25 mmol/L (ref 20–32)
Calcium: 9.7 mg/dL (ref 8.6–10.4)
Chloride: 103 mmol/L (ref 98–110)
Creat: 0.67 mg/dL (ref 0.50–1.05)
Globulin: 2.5 g/dL (calc) (ref 1.9–3.7)
Glucose, Bld: 87 mg/dL (ref 65–99)
Potassium: 4 mmol/L (ref 3.5–5.3)
Sodium: 140 mmol/L (ref 135–146)
Total Bilirubin: 0.5 mg/dL (ref 0.2–1.2)
Total Protein: 7 g/dL (ref 6.1–8.1)

## 2017-07-07 LAB — SPECIMEN STATUS

## 2017-07-10 LAB — H. PYLORI ANTIBODY, IGG

## 2017-07-11 DIAGNOSIS — M5417 Radiculopathy, lumbosacral region: Secondary | ICD-10-CM | POA: Diagnosis not present

## 2017-07-11 DIAGNOSIS — R202 Paresthesia of skin: Secondary | ICD-10-CM | POA: Diagnosis not present

## 2017-07-11 DIAGNOSIS — G5603 Carpal tunnel syndrome, bilateral upper limbs: Secondary | ICD-10-CM | POA: Diagnosis not present

## 2017-07-11 DIAGNOSIS — M5412 Radiculopathy, cervical region: Secondary | ICD-10-CM | POA: Diagnosis not present

## 2017-07-19 DIAGNOSIS — N920 Excessive and frequent menstruation with regular cycle: Secondary | ICD-10-CM | POA: Diagnosis not present

## 2017-07-19 DIAGNOSIS — Z780 Asymptomatic menopausal state: Secondary | ICD-10-CM | POA: Diagnosis not present

## 2017-07-19 DIAGNOSIS — N946 Dysmenorrhea, unspecified: Secondary | ICD-10-CM | POA: Diagnosis not present

## 2017-07-19 DIAGNOSIS — D259 Leiomyoma of uterus, unspecified: Secondary | ICD-10-CM | POA: Diagnosis not present

## 2017-07-25 DIAGNOSIS — N83209 Unspecified ovarian cyst, unspecified side: Secondary | ICD-10-CM | POA: Diagnosis not present

## 2017-07-25 DIAGNOSIS — R102 Pelvic and perineal pain: Secondary | ICD-10-CM | POA: Diagnosis not present

## 2017-07-25 DIAGNOSIS — N924 Excessive bleeding in the premenopausal period: Secondary | ICD-10-CM | POA: Diagnosis not present

## 2017-07-25 DIAGNOSIS — N946 Dysmenorrhea, unspecified: Secondary | ICD-10-CM | POA: Diagnosis not present

## 2017-07-25 DIAGNOSIS — N83299 Other ovarian cyst, unspecified side: Secondary | ICD-10-CM | POA: Diagnosis not present

## 2017-08-15 DIAGNOSIS — M5417 Radiculopathy, lumbosacral region: Secondary | ICD-10-CM | POA: Diagnosis not present

## 2017-08-15 DIAGNOSIS — M5412 Radiculopathy, cervical region: Secondary | ICD-10-CM | POA: Diagnosis not present

## 2017-08-22 ENCOUNTER — Encounter: Payer: Self-pay | Admitting: Family Medicine

## 2017-08-31 DIAGNOSIS — K760 Fatty (change of) liver, not elsewhere classified: Secondary | ICD-10-CM

## 2017-08-31 DIAGNOSIS — R1013 Epigastric pain: Secondary | ICD-10-CM

## 2017-08-31 HISTORY — DX: Fatty (change of) liver, not elsewhere classified: K76.0

## 2017-08-31 HISTORY — DX: Epigastric pain: R10.13

## 2017-09-10 ENCOUNTER — Other Ambulatory Visit: Payer: Self-pay

## 2017-09-10 ENCOUNTER — Emergency Department (HOSPITAL_BASED_OUTPATIENT_CLINIC_OR_DEPARTMENT_OTHER): Payer: BLUE CROSS/BLUE SHIELD

## 2017-09-10 ENCOUNTER — Emergency Department (HOSPITAL_BASED_OUTPATIENT_CLINIC_OR_DEPARTMENT_OTHER)
Admission: EM | Admit: 2017-09-10 | Discharge: 2017-09-10 | Disposition: A | Discharge status: Home / Self Care | Payer: BLUE CROSS/BLUE SHIELD | Attending: Emergency Medicine | Admitting: Emergency Medicine

## 2017-09-10 ENCOUNTER — Telehealth: Payer: Self-pay | Admitting: Family Medicine

## 2017-09-10 ENCOUNTER — Encounter (HOSPITAL_BASED_OUTPATIENT_CLINIC_OR_DEPARTMENT_OTHER): Payer: Self-pay | Admitting: *Deleted

## 2017-09-10 DIAGNOSIS — F419 Anxiety disorder, unspecified: Secondary | ICD-10-CM | POA: Diagnosis not present

## 2017-09-10 DIAGNOSIS — R739 Hyperglycemia, unspecified: Secondary | ICD-10-CM

## 2017-09-10 DIAGNOSIS — R079 Chest pain, unspecified: Secondary | ICD-10-CM | POA: Diagnosis not present

## 2017-09-10 DIAGNOSIS — R1013 Epigastric pain: Secondary | ICD-10-CM | POA: Diagnosis not present

## 2017-09-10 DIAGNOSIS — Z79899 Other long term (current) drug therapy: Secondary | ICD-10-CM | POA: Insufficient documentation

## 2017-09-10 DIAGNOSIS — K7689 Other specified diseases of liver: Secondary | ICD-10-CM | POA: Diagnosis not present

## 2017-09-10 LAB — URINALYSIS, ROUTINE W REFLEX MICROSCOPIC
Glucose, UA: NEGATIVE mg/dL
Hgb urine dipstick: NEGATIVE
Ketones, ur: 15 mg/dL — AB
Leukocytes, UA: NEGATIVE
Nitrite: NEGATIVE
Protein, ur: NEGATIVE mg/dL
Specific Gravity, Urine: 1.025 (ref 1.005–1.030)
pH: 6 (ref 5.0–8.0)

## 2017-09-10 LAB — COMPREHENSIVE METABOLIC PANEL
ALT: 23 U/L (ref 14–54)
AST: 23 U/L (ref 15–41)
Albumin: 4.3 g/dL (ref 3.5–5.0)
Alkaline Phosphatase: 42 U/L (ref 38–126)
Anion gap: 8 (ref 5–15)
BUN: 14 mg/dL (ref 6–20)
CO2: 23 mmol/L (ref 22–32)
Calcium: 9.2 mg/dL (ref 8.9–10.3)
Chloride: 106 mmol/L (ref 101–111)
Creatinine, Ser: 0.76 mg/dL (ref 0.44–1.00)
GFR calc Af Amer: >60 mL/min (ref >60)
GFR calc non Af Amer: >60 mL/min (ref >60)
Glucose, Bld: 171 mg/dL — ABNORMAL HIGH (ref 65–99)
Potassium: 3.7 mmol/L (ref 3.5–5.1)
Sodium: 137 mmol/L (ref 135–145)
Total Bilirubin: 0.7 mg/dL (ref 0.3–1.2)
Total Protein: 7 g/dL (ref 6.5–8.1)

## 2017-09-10 LAB — CBC
HCT: 40 % (ref 36.0–46.0)
Hemoglobin: 13.8 g/dL (ref 12.0–15.0)
MCH: 30.2 pg (ref 26.0–34.0)
MCHC: 34.5 g/dL (ref 30.0–36.0)
MCV: 87.5 fL (ref 78.0–100.0)
Platelets: 315 10*3/uL (ref 150–400)
RBC: 4.57 MIL/uL (ref 3.87–5.11)
RDW: 14 % (ref 11.5–15.5)
WBC: 7.1 10*3/uL (ref 4.0–10.5)

## 2017-09-10 LAB — TROPONIN I: Troponin I: <0.03 ng/mL (ref <0.03)

## 2017-09-10 LAB — LIPASE, BLOOD: Lipase: 35 U/L (ref 11–51)

## 2017-09-10 MED ORDER — SUCRALFATE 1 G PO TABS: 1.0000 g | ORAL_TABLET | Freq: Three times a day (TID) | ORAL | 0 refills | Status: DC

## 2017-09-10 MED ORDER — DICYCLOMINE HCL 10 MG PO CAPS
10.0000 mg | ORAL_CAPSULE | Freq: Once | ORAL | Status: AC
  Administered 2017-09-10: 10 mg via ORAL
  Filled 2017-09-10: qty 1

## 2017-09-10 MED ORDER — GI COCKTAIL ~~LOC~~
30.0000 mL | Freq: Once | ORAL | Status: AC
  Administered 2017-09-10: 30 mL via ORAL
  Filled 2017-09-10: qty 30

## 2017-09-10 MED ORDER — HYDROXYZINE HCL 25 MG PO TABS: 25.0000 mg | ORAL_TABLET | Freq: Three times a day (TID) | ORAL | 0 refills | Status: DC | PRN

## 2017-09-10 MED ORDER — FLUCONAZOLE 50 MG PO TABS
150.0000 mg | ORAL_TABLET | Freq: Once | ORAL | Status: AC
  Administered 2017-09-10: 150 mg via ORAL
  Filled 2017-09-10: qty 1

## 2017-09-10 MED FILL — hydrOXYzine HCL 25 MG TABS: 25 | 2 days supply | Qty: 8 | Fill #0

## 2017-09-10 MED FILL — SUCRALFATE 1 GM TABLET: 1 | 7 days supply | Qty: 28 | Fill #0

## 2017-09-10 NOTE — Telephone Encounter (Signed)
Copied from [place] [id number]. Topic: Quick Communication - See Telephone Encounter >> Sep 10, 2017  8:14 AM Cleaster Corin, NT wrote: CRM for notification. See Telephone encounter for:   09/10/17. Pt. Calling to schedule appt. With pcp (Dr. Anitra Lauth) for anxiety but no 30 min. Slots open. Pt. Seeing if she can be worked in this week. Pt. Can come in today if available.

## 2017-09-10 NOTE — ED Notes (Signed)
Pt. Is c/o lower abd. Pain.  Gives no number for the pain but states she has a constant pain now in the lower abd.

## 2017-09-10 NOTE — Telephone Encounter (Signed)
Yes, ok 

## 2017-09-10 NOTE — ED Triage Notes (Signed)
Pt reports "stomach pains" to epigastric and lower abd pain x December, pt states she was seen by her pcp and dx with gastroenteritis. Today she has the epigastric pain along with increased feeling of not being able to take a deep breath. Pt is a/a in nad, resp are easy and reg, rt at bedside to assess. Pt describes epigastric pain as "pressure" and rates at 9/10.pt reports diarrhea this am, and nausea, states both have been present since December, not new today.

## 2017-09-10 NOTE — ED Provider Notes (Signed)
[facility] Provider Note   CSN: [id number] Arrival date & time: 09/10/17  1137     History   Chief Complaint Chief Complaint  Patient presents with  . Chest Pain  . Shortness of Breath    HPI Karen Oconnell is a 52 y.o. female.  52yo F w/ PMH including uterine fibroids, depression who p/w abdominal pain. Pt reports that in December she began having intermittent epigastric and lower abdominal pain.  Pain is worse after eating so she has limited what she eats.  She has had intermittent diarrhea and nausea with the pain.  She has been evaluated by PCP, currently on omeprazole, but continues to have these symptoms.  Today, she had the same epigastric pain but today pressure was severe and she had difficulty taking a deep breath.  Currently she is not feeling the difficulty breathing.  She denies any associated chest pain.  No fevers or cough/cold symptoms.  No heavy alcohol or NSAID use.  She reports some mild dysuria this morning.  She has been seen by OB/GYN in the past and has known fibroid. No recent travel, OCP use, h/o blood clots, or h/o cancer.   The history is provided by the patient.  Chest Pain   Associated symptoms include shortness of breath.  Shortness of Breath  Associated symptoms include chest pain.    Past Medical History:  Diagnosis Date  . Allergy    seasonal  . Depression   . Fibroid uterus 08/2016   U/s at GYN  . Gestational diabetes   . History of adenomatous polyp of colon 07/2016   Recall 5 yrs (Dr. Havery Moros)  . HSV infection   . Menopausal symptoms    GYN started prometrium and vivelle 08/2016.  . Menorrhagia    + dysmenorrhea and hx of fibroids: GYN eval 07/2017->FSH normal, saline infusion u/s showed normal uterus/endomet + R ovarian cystic nodule suspected to be hemorrhagic cyst vs endometrioma.  CA 125 level normal.  Pt offered Mirena or hysterectomy and she declined both.  . Neck pain, musculoskeletal 02/06/2011    . 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 Postprandial bloating 08/2016   ? IBS.  GYN MD started Bentyl trial 08/2016.  . 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 Seasonal allergic rhinitis   . Stress fracture 2008   left ankle    Patient Active Problem List   Diagnosis Date Noted  . SI (sacroiliac) joint dysfunction 06/12/2016  . Nonallopathic lesion of sacral region 06/12/2016  . Nonallopathic lesion of lumbosacral region 06/12/2016  . Nonallopathic lesion of thoracic region 06/12/2016  . Maxillary sinusitis 06/15/2015  . Palpitations 04/19/2014  . Rotator cuff tendonitis 04/19/2014  . Health maintenance examination 04/09/2014  . Eustachian tube dysfunction 08/21/2013  . Mass of breast, right 04/11/2012  . Mastodynia, female 04/11/2012  . Allergy   . Gestational diabetes   . HSV infection   . Neck pain, musculoskeletal 02/06/2011  . Otitis externa 01/16/2011  . ANXIETY DEPRESSION 07/05/2010    Past Surgical History:  Procedure Laterality Date  . BUNIONECTOMY  08/2013   right  . CESAREAN SECTION  1997   X 1  . COLONOSCOPY W/ POLYPECTOMY  07/12/2016   Tubular adenoma: recall 07/2021.  +Diverticulosis L colon.  . cyst removed from back of right leg  6-12  . ENDOMETRIAL ABLATION  2010   HER OPTION ABLATION FOR MENORRHAGIA  . EYE SURGERY  age 33   right eye for  strabismus    OB History    Gravida Para Term Preterm AB Living   2 2       2    SAB TAB Ectopic Multiple Live Births                   Home Medications    Prior to Admission medications   Medication Sig Start Date End Date Taking? Authorizing Provider  hydrOXYzine (ATARAX/VISTARIL) 25 MG tablet Take 1 tablet (25 mg total) by mouth every 8 (eight) hours as needed for anxiety. 09/10/17   Siara Gorder, Wenda Overland, MD  ibuprofen (ADVIL,MOTRIN) 200 MG tablet Take 600 mg by mouth every 6 (six) hours as needed.      [provider]  omeprazole (PRILOSEC) 20 MG capsule Take 1 capsule (20 mg total)  by mouth daily. 07/06/17   Leamon Arnt, MD  sucralfate (CARAFATE) 1 g tablet Take 1 tablet (1 g total) by mouth 4 (four) times daily -  with meals and at bedtime for 7 days. 09/10/17 09/17/17  Kolton Kienle, Wenda Overland, MD  traMADol (ULTRAM) 50 MG tablet Take 1 tablet (50 mg total) by mouth every 6 (six) hours as needed for moderate pain. 07/06/17   Leamon Arnt, MD  zolpidem (AMBIEN) 10 MG tablet Take 1 tablet (10 mg total) at bedtime as needed by mouth. 05/14/17   McGowen, Adrian Blackwater, MD    Family History Family History  Problem Relation Age of Onset  . Diabetes Mother        Type 2  . Hypertension Mother   . Hyperlipidemia Mother   . Depression Mother   . Seasonal affective disorder Father   . Cancer Father        MELANOMA  . Bipolar disorder Brother   . Drug abuse Brother        cocaine, vicodin, pain killers  . ADD / ADHD Son   . Depression Son   . Cancer Maternal Grandmother        gyn possibly cervical or uterine  . Diabetes Maternal Grandfather        Type 2  . Hypertension Maternal Grandfather   . Depression Maternal Grandfather   . Alcohol abuse Paternal Grandfather   . Colon cancer Neg Hx   . Esophageal cancer Neg Hx   . Rectal cancer Neg Hx   . Stomach cancer Neg Hx     Social History Social History   Tobacco Use  . Smoking status: Never Smoker  . Smokeless tobacco: Never Used  Substance Use Topics  . Alcohol use: Yes    Alcohol/week: 0.0 oz    Comment: occasionaly  . Drug use: No     Allergies   Patient has no known allergies.   Review of Systems Review of Systems  Respiratory: Positive for shortness of breath.   Cardiovascular: Positive for chest pain.   All other systems reviewed and are negative except that which was mentioned in HPI   Physical Exam Updated Vital Signs BP 128/84 (BP Location: Left Arm)   Pulse 72   Temp 98.4 F (36.9 C) (Oral)   Resp 18   Ht 5\' 3"  (1.6 m)   Wt 63.5 kg (140 lb)   LMP 08/28/2017   SpO2 98%   BMI 24.80  kg/m   Physical Exam  Constitutional: She is oriented to person, place, and time. She appears well-developed and well-nourished. No distress.  uncomfortable  HENT:  Head: Normocephalic and atraumatic.  Moist mucous membranes  Eyes: Conjunctivae are normal. Pupils are equal, round, and reactive to light.  Neck: Neck supple.  Cardiovascular: Normal rate, regular rhythm and normal heart sounds.  No murmur heard. Pulmonary/Chest: Effort normal and breath sounds normal.  Abdominal: Soft. Bowel sounds are normal. She exhibits no distension. There is tenderness (mid epigastric).  Musculoskeletal: She exhibits no edema.  Neurological: She is alert and oriented to person, place, and time.  Fluent speech  Skin: Skin is warm and dry.  Psychiatric: Judgment normal.  Depressed mood  Nursing note and vitals reviewed.    ED Treatments / Results  Labs (all labs ordered are listed, but only abnormal results are displayed) Labs Reviewed  COMPREHENSIVE METABOLIC PANEL - Abnormal; Notable for the following components:      Result Value   Glucose, Bld 171 (*)    All other components within normal limits  URINALYSIS, ROUTINE W REFLEX MICROSCOPIC - Abnormal; Notable for the following components:   Bilirubin Urine SMALL (*)    Ketones, ur 15 (*)    All other components within normal limits  CBC  LIPASE, BLOOD  TROPONIN I    EKG  EKG Interpretation  Date/Time:  Monday September 10 2017 11:50:31 EDT Ventricular Rate:  103 PR Interval:    QRS Duration: 93 QT Interval:  352 QTC Calculation: 461 R Axis:   71 Text Interpretation:  Sinus tachycardia No previous ECGs available Confirmed by Theotis Burrow [phone]) on 09/10/2017 11:55:30 AM       Radiology US Abdomen Limited Ruq  Result Date: 09/10/2017 CLINICAL DATA:  52 year old female with epigastric/abdominal pain. Initial encounter. EXAM: ULTRASOUND ABDOMEN LIMITED RIGHT UPPER QUADRANT COMPARISON:  None. FINDINGS: Gallbladder: No gallstones  or wall thickening visualized. No sonographic Murphy sign noted by sonographer. Common bile duct: Diameter: 3.1 mm Liver: Multiple small liver cysts measuring up to 1.5 cm left lobe and 1.1 cm right lobe. Slight increased echogenicity of the liver may represent mild fatty infiltration. Portal vein is patent on color Doppler imaging with normal direction of blood flow towards the liver. IMPRESSION: Gallbladder within normal limits. Small liver cysts and possibly mild fatty infiltration of the liver. Electronically Signed   By: Genia Del M.D.   On: 09/10/2017 15:38    Procedures Procedures (including critical care time)  Medications Ordered in ED Medications  fluconazole (DIFLUCAN) tablet 150 mg (not administered)  gi cocktail (Maalox,Lidocaine,Donnatal) (30 mLs Oral Given 09/10/17 1250)  dicyclomine (BENTYL) capsule 10 mg (10 mg Oral Given 09/10/17 1250)     Initial Impression / Assessment and Plan / ED Course  I have reviewed the triage vital signs and the nursing notes.  Pertinent labs & imaging results that were available during my care of the patient were reviewed by me and considered in my medical decision making (see chart for details).    Pt non-toxic on exam, epigastric tenderness noted.  I reviewed her chart which shows no previous imaging of the gallbladder.  Obtain right upper quadrant ultrasound which showed normal gallbladder, no acute findings to explain her symptoms.  Her lab work showed normal LFTs and lipase, CBC.  No evidence of infection on UA.  She does have hyperglycemia with glucose 171.  She mentioned polyuria and I discussed the possibility of prediabetes or diabetes.  Instructed to see PCP for hemoglobin A1c check.  She also mentioned burning in the possibility of yeast infection therefore gave dose of Diflucan.  Given that this been going on for several months, I do feel it is  reasonable for her to see gastroenterology as she has not improved on omeprazole.  He stated  that her anxiety has been out of control recently and she wants to go back on her medications Zoloft and Xanax that she had been on previously.  Instructed her to see PCP regarding restarting these medications, provided Atarax to use as needed until then.  Discussed supportive measures and return precautions.  Final Clinical Impressions(s) / ED Diagnoses   Final diagnoses:  Epigastric pain  Anxiety  Hyperglycemia    ED Discharge Orders        Ordered    hydrOXYzine (ATARAX/VISTARIL) 25 MG tablet  Every 8 hours PRN     09/10/17 1556    sucralfate (CARAFATE) 1 g tablet  3 times daily with meals & bedtime     09/10/17 1556       Tresa Jolley, Wenda Overland, MD 09/10/17 1600

## 2017-09-10 NOTE — Telephone Encounter (Signed)
Okay to put pt in 15 min slot for anxiety? Please advise. Thanks.

## 2017-09-10 NOTE — Telephone Encounter (Signed)
Left message for pt to call back.  Okay for PEC to schedule pt for 15 min ov.

## 2017-09-10 NOTE — Telephone Encounter (Signed)
Copied from [place] [id number]. Topic: Quick Communication - See Telephone Encounter >> Sep 10, 2017  8:14 AM Cleaster Corin, NT wrote: CRM for notification. See Telephone encounter for:   09/10/17. Pt. Calling to schedule appt. With pcp (Dr. Anitra Lauth) for anxiety but no 30 min. Slots open. Pt. Seeing if she can be worked in this week. Pt. Can come in today if available. >> Sep 10, 2017  5:06 PM Neva Seat wrote: Pt left ER today - with Blood Sugar issues and anxiety w/ severe abdominal pain.   ER told pt she will need to be seen by her PCP this asap.  No available appt w/ Dr, Anitra Lauth. Pt is asking if she can be worked in if possible on Tuesday 09-11-17

## 2017-09-11 NOTE — Telephone Encounter (Signed)
Per Dr. Anitra Lauth unable to work pt in today. Reviewed ER note, pt okay to wait til next week when provider will be back in office. Okay per Dr. Anitra Lauth. SW pt and advised her that we could see her next week, she voiced understanding, apt made 30 min apt on 09/18/17 at 9:15am.

## 2017-09-12 DIAGNOSIS — G5603 Carpal tunnel syndrome, bilateral upper limbs: Secondary | ICD-10-CM | POA: Diagnosis not present

## 2017-09-12 DIAGNOSIS — M5412 Radiculopathy, cervical region: Secondary | ICD-10-CM | POA: Diagnosis not present

## 2017-09-12 DIAGNOSIS — R202 Paresthesia of skin: Secondary | ICD-10-CM | POA: Diagnosis not present

## 2017-09-12 DIAGNOSIS — M5417 Radiculopathy, lumbosacral region: Secondary | ICD-10-CM | POA: Diagnosis not present

## 2017-09-18 ENCOUNTER — Ambulatory Visit: Payer: BLUE CROSS/BLUE SHIELD | Admitting: Family Medicine

## 2017-09-18 ENCOUNTER — Encounter: Payer: Self-pay | Admitting: Family Medicine

## 2017-09-18 VITALS — BP 117/80 | HR 86 | Temp 98.2°F | Resp 16 | Ht 63.5 in | Wt 122.0 lb

## 2017-09-18 DIAGNOSIS — F411 Generalized anxiety disorder: Secondary | ICD-10-CM

## 2017-09-18 DIAGNOSIS — R739 Hyperglycemia, unspecified: Secondary | ICD-10-CM | POA: Diagnosis not present

## 2017-09-18 DIAGNOSIS — R1013 Epigastric pain: Secondary | ICD-10-CM | POA: Diagnosis not present

## 2017-09-18 DIAGNOSIS — F410 Panic disorder [episodic paroxysmal anxiety] without agoraphobia: Secondary | ICD-10-CM | POA: Diagnosis not present

## 2017-09-18 DIAGNOSIS — K219 Gastro-esophageal reflux disease without esophagitis: Secondary | ICD-10-CM | POA: Diagnosis not present

## 2017-09-18 LAB — POCT GLYCOSYLATED HEMOGLOBIN (HGB A1C): Hemoglobin A1C: 5.4

## 2017-09-18 LAB — GLUCOSE, POCT (MANUAL RESULT ENTRY): POC Glucose: 90 mg/dl (ref 70–99)

## 2017-09-18 MED ORDER — OMEPRAZOLE 40 MG PO CPDR: 40.0000 mg | DELAYED_RELEASE_CAPSULE | Freq: Every day | ORAL | 3 refills | Status: DC

## 2017-09-18 MED ORDER — ALPRAZOLAM 0.5 MG PO TABS: ORAL_TABLET | ORAL | 1 refills | Status: DC

## 2017-09-18 MED ORDER — DULOXETINE HCL 30 MG PO CPEP: 30.0000 mg | ORAL_CAPSULE | Freq: Every day | ORAL | 1 refills | Status: DC

## 2017-09-18 NOTE — Progress Notes (Signed)
OFFICE VISIT  09/18/2017   CC:  Chief Complaint  Patient presents with  . Hospitalization Follow-up   HPI:    Patient is a 52 y.o. Caucasian female who presents for GERD/dyspepsia, recent hyperglycemia, and anxiety. She presented to the ED 09/10/17 with epigastric pain, nausea, diarrhea.  Abd u/s, lab work, and UA all normal except her glucose was 171 (non-fasting). ED MD felt like GI referral was indicated as outpt--further eval current upper GI sx's, she does have past hx of suspected IBS (bentyl rx'd 2018).  She has seen Dr. Havery Moros with [facility] 07/2016 for screening colonoscopy.  She has GI appt in 3d (09/21/17). Also referred back to me to discuss restart anxiety meds as pt has not been on these recently (zoloft and xanax) and she seems to have much worsened generalized anxiety lately--possbily contributing to her physical sx's.  Review of past glucoses in EMR show range 87-108.  No glucosuria on recent UA in the ED.   Update: feels like GERD/dyspepsia sx's "getting under control".  Has been taking 20mg  omep and carafate. Some nausea but this is much improved since ED visit.  She admits that bad GAD is a huge contributor to all physical sx's. Unable to relax, always keyed up, easily emotional, stomach feels tight all the time.  Feels overwhelmed with life. Lots of life circumstances (family issues) contributing.  +Mild panic---she feels like this was what was happening when she decided to go to ED recently.  Mood: she hesitates to endorse persistently depressed mood. Was on zoloft in the past but she weened herself off b/c she felt like it wasn't helping anymore Ambien helps initiate sleep, but still has trouble maintaining sleep. She asks for counseling referral.  Past Medical History:  Diagnosis Date  . Allergy    seasonal  . Depression   . Fatty liver 08/2017   u/s  . Fibroid uterus 08/2016   U/s at GYN  . Gestational diabetes   . History of adenomatous polyp of  colon 07/2016   Recall 5 yrs (Dr. Havery Moros)  . HSV infection   . Menopausal symptoms    GYN started prometrium and vivelle 08/2016.  . Menorrhagia    + dysmenorrhea and hx of fibroids: GYN eval 07/2017->FSH normal, saline infusion u/s showed normal uterus/endomet + R ovarian cystic nodule suspected to be hemorrhagic cyst vs endometrioma.  CA 125 level normal.  Pt offered Mirena or hysterectomy and she declined both.  . Neck pain, musculoskeletal 02/06/2011  . 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 Postprandial bloating 08/2016   ? IBS.  GYN MD started Bentyl trial 08/2016.  . 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 Seasonal allergic rhinitis   . Stress fracture 2008   left ankle    Past Surgical History:  Procedure Laterality Date  . BUNIONECTOMY  08/2013   right  . CESAREAN SECTION  1997   X 1  . COLONOSCOPY W/ POLYPECTOMY  07/12/2016   Tubular adenoma: recall 07/2021.  +Diverticulosis L colon.  . cyst removed from back of right leg  6-12  . ENDOMETRIAL ABLATION  2010   HER OPTION ABLATION FOR MENORRHAGIA  . EYE SURGERY  age 52   right eye for strabismus    Outpatient Medications Prior to Visit  Medication Sig Dispense Refill  . traMADol (ULTRAM) 50 MG tablet Take 1 tablet (50 mg total) by mouth every 6 (six) hours as needed for moderate pain. 30 tablet 0  . zolpidem (AMBIEN)  10 MG tablet Take 1 tablet (10 mg total) at bedtime as needed by mouth. 30 tablet 5  . omeprazole (PRILOSEC) 20 MG capsule Take 1 capsule (20 mg total) by mouth daily. 30 capsule 3  . hydrOXYzine (ATARAX/VISTARIL) 25 MG tablet Take 1 tablet (25 mg total) by mouth every 8 (eight) hours as needed for anxiety. (Patient not taking: Reported on 09/18/2017) 8 tablet 0  . ibuprofen (ADVIL,MOTRIN) 200 MG tablet Take 600 mg by mouth every 6 (six) hours as needed.      . sucralfate (CARAFATE) 1 g tablet Take 1 tablet (1 g total) by mouth 4 (four) times daily -  with meals and at bedtime for 7 days. 28 tablet 0    Facility-Administered Medications Prior to Visit  Medication Dose Route Frequency Provider Last Rate Last Dose  . 0.9 %  sodium chloride infusion  500 mL Intravenous Continuous Armbruster, Carlota Raspberry, MD        No Known Allergies  ROS As per HPI  PE: Blood pressure 117/80, pulse 86, temperature 98.2 F (36.8 C), temperature source Oral, resp. rate 16, height 5' 3.5" (1.613 m), weight 122 lb (55.3 kg), last menstrual period 09/18/2017, SpO2 97 %. Gen: Alert, well appearing.  Patient is oriented to person, place, time, and situation. AFFECT: pleasant, lucid thought and speech. CV: RRR, no m/r/g.   LUNGS: CTA bilat, nonlabored resps, good aeration in all lung fields. ABD: soft, mod diffuse upper abd tenderness w/out guarding or rebound. ND, BS normal.  No hepatospenomegaly or mass.  No bruits. EXT: no clubbing, cyanosis, or edema.   LABS:  Lab Results  Component Value Date   TSH 0.55 04/27/2017   Lab Results  Component Value Date   WBC 7.1 09/10/2017   HGB 13.8 09/10/2017   HCT 40.0 09/10/2017   MCV 87.5 09/10/2017   PLT 315 09/10/2017   Lab Results  Component Value Date   CREATININE 0.76 09/10/2017   BUN 14 09/10/2017   NA 137 09/10/2017   K 3.7 09/10/2017   CL 106 09/10/2017   CO2 23 09/10/2017   Lab Results  Component Value Date   ALT 23 09/10/2017   AST 23 09/10/2017   ALKPHOS 42 09/10/2017   BILITOT 0.7 09/10/2017   Lab Results  Component Value Date   CHOL 192 04/27/2017   Lab Results  Component Value Date   HDL 79.30 04/27/2017   Lab Results  Component Value Date   LDLCALC 101 (H) 04/27/2017   Lab Results  Component Value Date   TRIG 60.0 04/27/2017   Lab Results  Component Value Date   CHOLHDL 2 04/27/2017   POC glucose today (fasting)=90. POC HbA1c today=5.4%  IMPRESSION AND PLAN:  1) GERD, dyspepsia/gastritis---largely exacerbated by worsening of GAD. Increase omeprazole to 40mg  qd.  Finish carafate rx'd by EDP recently. Avoid  NSAIDs. She has appt with GI MD in a few days for further eval/mgmt.  2) Hyperglycemia: reassured.  Most recent gluc in ED was non-fasting + in the context of acute stress. Glucose and Hba1c here today normal.  3) GAD, with panic.  Has some depression sx's but doesn't meet criteria for MDD. Start duloxetine 30mg  qd. Rx for alprazolam 0.5mg , 1-2 tid prn, #60, RF x 1. Therapeutic expectations and side effect profile of medication discussed today.  Patient's questions answered.  Referral to Dr. Gaynell Face, psychologist, ordered today.  An After Visit Summary was printed and given to the patient.  FOLLOW UP: Return in about 4  weeks (around 10/16/2017) for f/u anxiety/mood.  Signed:  Crissie Sickles, MD           09/18/2017

## 2017-09-21 ENCOUNTER — Ambulatory Visit: Payer: BLUE CROSS/BLUE SHIELD | Admitting: Physician Assistant

## 2017-09-21 ENCOUNTER — Encounter: Payer: Self-pay | Admitting: Physician Assistant

## 2017-09-21 VITALS — BP 120/60 | HR 66 | Ht 63.5 in | Wt 120.2 lb

## 2017-09-21 DIAGNOSIS — R1032 Left lower quadrant pain: Secondary | ICD-10-CM

## 2017-09-21 DIAGNOSIS — R1031 Right lower quadrant pain: Secondary | ICD-10-CM

## 2017-09-21 DIAGNOSIS — R1013 Epigastric pain: Secondary | ICD-10-CM | POA: Diagnosis not present

## 2017-09-21 MED ORDER — DICYCLOMINE HCL 10 MG PO CAPS: 10.0000 mg | ORAL_CAPSULE | Freq: Three times a day (TID) | ORAL | 0 refills | Status: DC

## 2017-09-21 NOTE — Progress Notes (Signed)
Chief Complaint: Epigastric pain, lower abdominal cramping  HPI:    Karen Oconnell is a 52 year old Caucasian female with a past medical history as listed below, who follows with Dr. Havery Moros, who presents to clinic for a complaint of epigastric pain and lower abdominal cramping.      07/12/16 colonoscopy with diverticulosis in the left colon and to 4-5 mm polyps in the sigmoid colon removed with a cold snare, otherwise normal exam.    09/10/17 ED visit for chest pain and shortness of breath.  CMP, CBC, urinalysis, lipase and troponin normal.  Right upper quadrant ultrasound 09/10/17 showed gallbladder within normal limits, small liver cyst and possible mild fatty infiltration.    Today, explains that in December started with some epigastric swelling worse after eating as well as a feeling of discomfort and pain rated as a 5-6/10.  Due to this patient felt full very fast after eating only a small amount of food and was uncomfortable.  She has been losing weight over the past couple of months as this has led to a decrease in appetite.  Prior to this patient was using Ibuprofen almost every day for arthritic pain, but after developing this stomach issue she stopped her Ibuprofen as well as decreased her alcohol intake and caffeine.  Patient was started on Omeprazole 20 mg over-the-counter initially in December, but her pain continued.  She was then seen in the ER as above.  Seen by primary care provider just Monday of this week who increased her Omeprazole to 40 mg daily and also placed her back on Xanax and Cymbalta for her anxiety and depression.    Also describes lower abdominal cramping pain which is separate from her epigastric pain today.  Describes long history of IBS symptoms in the past which she has typically treated with increasing water and fiber intake.    Denies fever, chills, heartburn, reflux, change in bowel habits or symptoms that awaken her at night.  Past Medical History:  Diagnosis  Date  . Allergy    seasonal  . Depression   . Fatty liver 08/2017   u/s  . Fibroid uterus 08/2016   U/s at GYN  . Gestational diabetes   . History of adenomatous polyp of colon 07/2016   Recall 5 yrs (Dr. Havery Moros)  . HSV infection   . Menopausal symptoms    GYN started prometrium and vivelle 08/2016.  . Menorrhagia    + dysmenorrhea and hx of fibroids: GYN eval 07/2017->FSH normal, saline infusion u/s showed normal uterus/endomet + R ovarian cystic nodule suspected to be hemorrhagic cyst vs endometrioma.  CA 125 level normal.  Pt offered Mirena or hysterectomy and she declined both.  . Neck pain, musculoskeletal 02/06/2011  . 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 Postprandial bloating 08/2016   ? IBS.  GYN MD started Bentyl trial 08/2016.  . 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 Seasonal allergic rhinitis   . Stress fracture 2008   left ankle    Past Surgical History:  Procedure Laterality Date  . BUNIONECTOMY  08/2013   right  . CESAREAN SECTION  1997   X 1  . COLONOSCOPY W/ POLYPECTOMY  07/12/2016   Tubular adenoma: recall 07/2021.  +Diverticulosis L colon.  . cyst removed from back of right leg  6-12  . ENDOMETRIAL ABLATION  2010   HER OPTION ABLATION FOR MENORRHAGIA  . EYE SURGERY  age 89   right eye for strabismus    Current  Outpatient Medications  Medication Sig Dispense Refill  . ALPRAZolam (XANAX) 0.5 MG tablet 1-2 tabs po tid prn anxiety 60 tablet 1  . DULoxetine (CYMBALTA) 30 MG capsule Take 1 capsule (30 mg total) by mouth daily. 30 capsule 1  . ibuprofen (ADVIL,MOTRIN) 200 MG tablet Take 600 mg by mouth every 6 (six) hours as needed.      Marland Kitchen omeprazole (PRILOSEC) 40 MG capsule Take 1 capsule (40 mg total) by mouth daily. 30 capsule 3  . traMADol (ULTRAM) 50 MG tablet Take 1 tablet (50 mg total) by mouth every 6 (six) hours as needed for moderate pain. 30 tablet 0  . zolpidem (AMBIEN) 10 MG tablet Take 1 tablet (10 mg total) at bedtime as needed by  mouth. 30 tablet 5  . dicyclomine (BENTYL) 10 MG capsule Take 1 capsule (10 mg total) by mouth 4 (four) times daily -  before meals and at bedtime. Take 30 minutes to an hour before meals. 90 capsule 0  . sucralfate (CARAFATE) 1 g tablet Take 1 tablet (1 g total) by mouth 4 (four) times daily -  with meals and at bedtime for 7 days. 28 tablet 0   Current Facility-Administered Medications  Medication Dose Route Frequency Provider Last Rate Last Dose  . 0.9 %  sodium chloride infusion  500 mL Intravenous Continuous Armbruster, Carlota Raspberry, MD        Allergies as of 09/21/2017  . (No Known Allergies)    Family History  Problem Relation Age of Onset  . Diabetes Mother        Type 2  . Hypertension Mother   . Hyperlipidemia Mother   . Depression Mother   . Seasonal affective disorder Father   . Cancer Father        MELANOMA  . Bipolar disorder Brother   . Drug abuse Brother        cocaine, vicodin, pain killers  . ADD / ADHD Son   . Depression Son   . Cancer Maternal Grandmother        gyn possibly cervical or uterine  . Diabetes Maternal Grandfather        Type 2  . Hypertension Maternal Grandfather   . Depression Maternal Grandfather   . Alcohol abuse Paternal Grandfather   . Colon cancer Neg Hx   . Esophageal cancer Neg Hx   . Rectal cancer Neg Hx   . Stomach cancer Neg Hx     Social History   Socioeconomic History  . Marital status: Married    Spouse name: Not on file  . Number of children: Not on file  . Years of education: Not on file  . Highest education level: Not on file  Occupational History  . Not on file  Social Needs  . Financial resource strain: Not on file  . Food insecurity:    Worry: Not on file    Inability: Not on file  . Transportation needs:    Medical: Not on file    Non-medical: Not on file  Tobacco Use  . Smoking status: Never Smoker  . Smokeless tobacco: Never Used  Substance and Sexual Activity  . Alcohol use: Yes    Alcohol/week: 0.0  oz    Comment: occasionaly  . Drug use: No  . Sexual activity: Yes    Partners: Male    Birth control/protection: Other-see comments, Surgical    Comment: husband vasectomy  Lifestyle  . Physical activity:    Days per week: Not  on file    Minutes per session: Not on file  . Stress: Not on file  Relationships  . Social connections:    Talks on phone: Not on file    Gets together: Not on file    Attends religious service: Not on file    Active member of club or organization: Not on file    Attends meetings of clubs or organizations: Not on file    Relationship status: Not on file  . Intimate partner violence:    Fear of current or ex partner: Not on file    Emotionally abused: Not on file    Physically abused: Not on file    Forced sexual activity: Not on file  Other Topics Concern  . 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   No T/A/Ds.    Review of Systems:    Constitutional: No weight loss, fever or chills Cardiovascular: No chest pain Respiratory: No SOB  Gastrointestinal: See HPI and otherwise negative   Physical Exam:  Vital signs: BP 120/60   Pulse 66   Ht 5' 3.5" (1.613 m)   Wt 120 lb 3.2 oz (54.5 kg)   LMP 09/18/2017   SpO2 99%   BMI 20.96 kg/m   Constitutional:   Pleasant Caucasian female appears to be in NAD, Well developed, Well nourished, alert and cooperative Respiratory: Respirations even and unlabored. Lungs clear to auscultation bilaterally.   No wheezes, crackles, or rhonchi.  Cardiovascular: Normal S1, S2. No MRG. Regular rate and rhythm. No peripheral edema, cyanosis or pallor.  Gastrointestinal:  Soft, nondistended, mild epigastric and bilateral lower abdominal tenderness to palpation. No rebound or guarding. Normal bowel sounds. No appreciable masses or hepatomegaly. Rectal:  Not performed.  Psychiatric:  Demonstrates good judgement and reason without abnormal affect or  behaviors.  RELEVANT LABS AND IMAGING: CBC    Component Value Date/Time   WBC 7.1 09/10/2017 1201   RBC 4.57 09/10/2017 1201   HGB 13.8 09/10/2017 1201   HGB WILL FOLLOW 07/06/2017 1548   HCT 40.0 09/10/2017 1201   HCT WILL FOLLOW 07/06/2017 1548   PLT 315 09/10/2017 1201   PLT WILL FOLLOW 07/06/2017 1548   MCV 87.5 09/10/2017 1201   MCV WILL FOLLOW 07/06/2017 1548   MCH 30.2 09/10/2017 1201   MCHC 34.5 09/10/2017 1201   RDW 14.0 09/10/2017 1201   RDW WILL FOLLOW 07/06/2017 1548   LYMPHSABS 2,533 07/06/2017 1548   LYMPHSABS WILL FOLLOW 07/06/2017 1548   MONOABS 0.4 04/27/2017 0801   EOSABS 62 07/06/2017 1548   EOSABS WILL FOLLOW 07/06/2017 1548   BASOSABS 39 07/06/2017 1548   BASOSABS WILL FOLLOW 07/06/2017 1548    CMP     Component Value Date/Time   NA 137 09/10/2017 1201   K 3.7 09/10/2017 1201   CL 106 09/10/2017 1201   CO2 23 09/10/2017 1201   GLUCOSE 171 (H) 09/10/2017 1201   BUN 14 09/10/2017 1201   CREATININE 0.76 09/10/2017 1201   CREATININE 0.67 07/06/2017 1548   CALCIUM 9.2 09/10/2017 1201   PROT 7.0 09/10/2017 1201   ALBUMIN 4.3 09/10/2017 1201   AST 23 09/10/2017 1201   ALT 23 09/10/2017 1201   ALKPHOS 42 09/10/2017 1201   BILITOT 0.7 09/10/2017 1201   GFRNONAA >60 09/10/2017 1201   GFRAA >60 09/10/2017 1201   US Abdomen Limited Ruq  Result Date: 09/10/2017 CLINICAL DATA:  52 year old female with epigastric/abdominal pain. Initial  encounter. EXAM: ULTRASOUND ABDOMEN LIMITED RIGHT UPPER QUADRANT COMPARISON:  None. FINDINGS: Gallbladder: No gallstones or wall thickening visualized. No sonographic Murphy sign noted by sonographer. Common bile duct: Diameter: 3.1 mm Liver: Multiple small liver cysts measuring up to 1.5 cm left lobe and 1.1 cm right lobe. Slight increased echogenicity of the liver may represent mild fatty infiltration. Portal vein is patent on color Doppler imaging with normal direction of blood flow towards the liver. IMPRESSION:  Gallbladder within normal limits. Small liver cysts and possibly mild fatty infiltration of the liver. Electronically Signed   By: Genia Del M.D.   On: 09/10/2017 15:38   Assessment: 1.  Epigastric pain: Since December of this year, patient had a lot of stress around that time, no relief with Omeprazole 20 mg, recently increased to 40 mg 3 days ago, also started back on antipsychotics; consider functional dyspepsia versus gastritis from NSAID use 2.  Bilateral lower abdominal cramping: Intermittently throughout the day, most consistent with IBS, recent colonoscopy with polyps and diverticula  Plan: 1.  Prescribed Dicyclomine 10 mg to be taken 20-30 minutes 3 times a day prior to meals.  #90 with 1 refill 2.  Recommend the patient continue her Omeprazole 40 mg daily, 30-60 minutes before eating breakfast and dinner. 3.  Reviewed antireflux diet and lifestyle modifications. Discontinue NSAID use. 4.  Encouraged patient to continue on her antianxiety and antidepression medications as this may also help with her IBS symptoms 5.  Patient to follow in clinic with me in 4 weeks.  If she is not any better from symptoms above, would recommend an EGD for further evaluation with Dr. Havery Moros.  Ellouise Newer, PA-C [facility] 09/21/2017, 11:42 AM  Cc: McGowen, Adrian Blackwater, MD

## 2017-09-21 NOTE — Patient Instructions (Signed)
If you are age 52 or older, your body mass index should be between 23-30. Your Body mass index is 20.96 kg/m. If this is out of the aforementioned range listed, please consider follow up with your Primary Care Provider.  If you are age 40 or younger, your body mass index should be between 19-25. Your Body mass index is 20.96 kg/m. If this is out of the aformentioned range listed, please consider follow up with your Primary Care Provider.   We have sent the following medications to your pharmacy for you to pick up at your convenience: Dicyclomine 10mg  three times a day. 30 minutes to one hour before meals.  Continue taking your Omeprazole as prescribed.  Thank you

## 2017-09-21 NOTE — Progress Notes (Signed)
Agree with assessment and plan as outlined. If she fails to improve with conservative measures, agree that she should have an EGD or further workup as indicated. She should follow up with [place] in a few weeks as outlined for reassessment.

## 2017-10-10 ENCOUNTER — Other Ambulatory Visit: Payer: Self-pay | Admitting: *Deleted

## 2017-10-10 MED ORDER — DULOXETINE HCL 30 MG PO CPEP: 30.0000 mg | ORAL_CAPSULE | Freq: Every day | ORAL | 0 refills | Status: DC

## 2017-10-10 NOTE — Telephone Encounter (Signed)
Received fax from [facility] requesting Rx for 90 day supply.  This is a new medication started on 09/18/17. Pt has f/u apt 10/22/17, will send in another 30 day supply to get pt by till f/u apt.

## 2017-10-12 ENCOUNTER — Ambulatory Visit: Payer: BLUE CROSS/BLUE SHIELD | Admitting: Clinical

## 2017-10-12 DIAGNOSIS — F4323 Adjustment disorder with mixed anxiety and depressed mood: Secondary | ICD-10-CM

## 2017-10-15 ENCOUNTER — Other Ambulatory Visit: Payer: Self-pay | Admitting: Physician Assistant

## 2017-10-22 ENCOUNTER — Encounter: Payer: Self-pay | Admitting: Physician Assistant

## 2017-10-22 ENCOUNTER — Ambulatory Visit: Payer: BLUE CROSS/BLUE SHIELD | Admitting: Physician Assistant

## 2017-10-22 ENCOUNTER — Ambulatory Visit: Payer: BLUE CROSS/BLUE SHIELD | Admitting: Family Medicine

## 2017-10-22 VITALS — BP 102/74 | HR 68 | Ht 63.5 in | Wt 120.5 lb

## 2017-10-22 DIAGNOSIS — R1032 Left lower quadrant pain: Secondary | ICD-10-CM | POA: Diagnosis not present

## 2017-10-22 DIAGNOSIS — R1013 Epigastric pain: Secondary | ICD-10-CM | POA: Diagnosis not present

## 2017-10-22 DIAGNOSIS — R1031 Right lower quadrant pain: Secondary | ICD-10-CM

## 2017-10-22 MED ORDER — OMEPRAZOLE 40 MG PO CPDR: 40.0000 mg | DELAYED_RELEASE_CAPSULE | Freq: Every day | ORAL | 5 refills | Status: DC

## 2017-10-22 NOTE — Progress Notes (Signed)
Chief Complaint: Follow-up epigastric pain, lower abdominal cramping  HPI:    Karen Oconnell is a 52 year old Caucasian female with a past medical history as listed below, who follows with Dr. Havery Moros returns to clinic today for follow-up of her epigastric pain and lower abdominal cramping.    09/21/17 office visit describing epigastric swelling worse after eating as well as a feeling of discomfort rated as a 5-6/10.  Describes losing weight due to decrease in appetite.  Also using ibuprofen for arthritic pain.  Also lower abdominal cramping with a long history of IBS.  Started on dicyclomine 10 mg 3 times daily and continued on omeprazole 40 mg daily.    Today, explains she never started her Dicyclomine as she had also recently been started on Cymbalta and was having trouble with 1 of these medications not sure which.  Irregardless she has had no further lower abdominal cramping unless it is around the time of her period which is "a whole different issue".    Describes overall she feels 98% better.  She is taking Omeprazole 40 mg nightly and has had only a few continued episodes of epigastric discomfort typically after eating the wrong foods.  She is very happy with the results and would like to continue on this medication.    Denies fever, chills, blood in her stool, melena, weight loss, anorexia or symptoms that awaken her at night.  Past Medical History:  Diagnosis Date  . Allergy    seasonal  . Depression   . Fatty liver 08/2017   u/s  . Fibroid uterus 08/2016   U/s at GYN  . Gestational diabetes   . History of adenomatous polyp of colon 07/2016   Recall 5 yrs (Dr. Havery Moros)  . HSV infection   . Menopausal symptoms    GYN started prometrium and vivelle 08/2016.  . Menorrhagia    + dysmenorrhea and hx of fibroids: GYN eval 07/2017->FSH normal, saline infusion u/s showed normal uterus/endomet + R ovarian cystic nodule suspected to be hemorrhagic cyst vs endometrioma.  CA 125 level  normal.  Pt offered Mirena or hysterectomy and she declined both.  . Neck pain, musculoskeletal 02/06/2011  . 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 Postprandial bloating 08/2016   ? IBS.  GYN MD started Bentyl trial 08/2016.  . 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 Seasonal allergic rhinitis   . Stress fracture 2008   left ankle    Past Surgical History:  Procedure Laterality Date  . BUNIONECTOMY  08/2013   right  . CESAREAN SECTION  1997   X 1  . COLONOSCOPY W/ POLYPECTOMY  07/12/2016   Tubular adenoma: recall 07/2021.  +Diverticulosis L colon.  . cyst removed from back of right leg  6-12  . ENDOMETRIAL ABLATION  2010   HER OPTION ABLATION FOR MENORRHAGIA  . EYE SURGERY  age 41   right eye for strabismus    Current Outpatient Medications  Medication Sig Dispense Refill  . ALPRAZolam (XANAX) 0.5 MG tablet 1-2 tabs po tid prn anxiety 60 tablet 1  . DULoxetine (CYMBALTA) 30 MG capsule Take 1 capsule (30 mg total) by mouth daily. 30 capsule 0  . ibuprofen (ADVIL,MOTRIN) 200 MG tablet Take 600 mg by mouth every 6 (six) hours as needed.      Marland Kitchen omeprazole (PRILOSEC) 40 MG capsule Take 1 capsule (40 mg total) by mouth daily. 30 capsule 3  . traMADol (ULTRAM) 50 MG tablet Take 1 tablet (50 mg  total) by mouth every 6 (six) hours as needed for moderate pain. 30 tablet 0  . zolpidem (AMBIEN) 10 MG tablet Take 1 tablet (10 mg total) at bedtime as needed by mouth. 30 tablet 5   Current Facility-Administered Medications  Medication Dose Route Frequency Provider Last Rate Last Dose  . 0.9 %  sodium chloride infusion  500 mL Intravenous Continuous Armbruster, Carlota Raspberry, MD        Allergies as of 10/22/2017  . (No Known Allergies)    Family History  Problem Relation Age of Onset  . Diabetes Mother        Type 2  . Hypertension Mother   . Hyperlipidemia Mother   . Depression Mother   . Seasonal affective disorder Father   . Cancer Father        MELANOMA  . Bipolar disorder  Brother   . Drug abuse Brother        cocaine, vicodin, pain killers  . ADD / ADHD Son   . Depression Son   . Cancer Maternal Grandmother        gyn possibly cervical or uterine  . Diabetes Maternal Grandfather        Type 2  . Hypertension Maternal Grandfather   . Depression Maternal Grandfather   . Alcohol abuse Paternal Grandfather   . Colon cancer Neg Hx   . Esophageal cancer Neg Hx   . Rectal cancer Neg Hx   . Stomach cancer Neg Hx     Social History   Socioeconomic History  . Marital status: Married    Spouse name: Not on file  . Number of children: Not on file  . Years of education: Not on file  . Highest education level: Not on file  Occupational History  . Not on file  Social Needs  . Financial resource strain: Not on file  . Food insecurity:    Worry: Not on file    Inability: Not on file  . Transportation needs:    Medical: Not on file    Non-medical: Not on file  Tobacco Use  . Smoking status: Never Smoker  . Smokeless tobacco: Never Used  Substance and Sexual Activity  . Alcohol use: Yes    Alcohol/week: 0.0 oz    Comment: occasionaly  . Drug use: No  . Sexual activity: Yes    Partners: Male    Birth control/protection: Other-see comments, Surgical    Comment: husband vasectomy  Lifestyle  . Physical activity:    Days per week: Not on file    Minutes per session: Not on file  . Stress: Not on file  Relationships  . Social connections:    Talks on phone: Not on file    Gets together: Not on file    Attends religious service: Not on file    Active member of club or organization: Not on file    Attends meetings of clubs or organizations: Not on file    Relationship status: Not on file  . Intimate partner violence:    Fear of current or ex partner: Not on file    Emotionally abused: Not on file    Physically abused: Not on file    Forced sexual activity: Not on file  Other Topics Concern  . 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   No T/A/Ds.    Review of Systems:    Constitutional: No weight loss,  fever or chills Cardiovascular: No chest pain  Respiratory: No SOB Gastrointestinal: See HPI and otherwise negative   Physical Exam:  Vital signs: BP 102/74   Pulse 68   Ht 5' 3.5" (1.613 m)   Wt 120 lb 8 oz (54.7 kg)   BMI 21.01 kg/m     Constitutional:   Pleasant Caucasian female appears to be in NAD, Well developed, Well nourished, alert and cooperative Respiratory: Respirations even and unlabored. Lungs clear to auscultation bilaterally.   No wheezes, crackles, or rhonchi.  Cardiovascular: Normal S1, S2. No MRG. Regular rate and rhythm. No peripheral edema, cyanosis or pallor.  Gastrointestinal:  Soft, nondistended, nontender. No rebound or guarding. Normal bowel sounds. No appreciable masses or hepatomegaly. Psychiatric:  Demonstrates good judgement and reason without abnormal affect or behaviors.  MOST RECENT LABS AND IMAGING: CBC    Component Value Date/Time   WBC 7.1 09/10/2017 1201   RBC 4.57 09/10/2017 1201   HGB 13.8 09/10/2017 1201   HGB WILL FOLLOW 07/06/2017 1548   HCT 40.0 09/10/2017 1201   HCT WILL FOLLOW 07/06/2017 1548   PLT 315 09/10/2017 1201   PLT WILL FOLLOW 07/06/2017 1548   MCV 87.5 09/10/2017 1201   MCV WILL FOLLOW 07/06/2017 1548   MCH 30.2 09/10/2017 1201   MCHC 34.5 09/10/2017 1201   RDW 14.0 09/10/2017 1201   RDW WILL FOLLOW 07/06/2017 1548   LYMPHSABS 2,533 07/06/2017 1548   LYMPHSABS WILL FOLLOW 07/06/2017 1548   MONOABS 0.4 04/27/2017 0801   EOSABS 62 07/06/2017 1548   EOSABS WILL FOLLOW 07/06/2017 1548   BASOSABS 39 07/06/2017 1548   BASOSABS WILL FOLLOW 07/06/2017 1548    CMP     Component Value Date/Time   NA 137 09/10/2017 1201   K 3.7 09/10/2017 1201   CL 106 09/10/2017 1201   CO2 23 09/10/2017 1201   GLUCOSE 171 (H) 09/10/2017 1201   BUN 14 09/10/2017 1201   CREATININE 0.76 09/10/2017 1201    CREATININE 0.67 07/06/2017 1548   CALCIUM 9.2 09/10/2017 1201   PROT 7.0 09/10/2017 1201   ALBUMIN 4.3 09/10/2017 1201   AST 23 09/10/2017 1201   ALT 23 09/10/2017 1201   ALKPHOS 42 09/10/2017 1201   BILITOT 0.7 09/10/2017 1201   GFRNONAA >60 09/10/2017 1201   GFRAA >60 09/10/2017 1201    Assessment: 1. Epigastric pain: Better on Omeprazole 40 mg daily, likely related to gastritis/GERD 2.  Lower abdominal cramping: Continues around time of period;  Likely related to menstruation  Plan: 1.  Continue Omeprazole 40 mg nightly prescribed #30 with 5 refills. 2.  Reviewed antireflux diet and lifestyle modifications 3.  Recommend she follow-up with her gynecologist regarding cramping around menstruation 4.  Patient to follow with me in 6 months.  Pending symptoms at that time could discuss decrease to 20 mg versus stopping medication.  Ellouise Newer, PA-C [facility] 10/22/2017, 11:21 AM  Cc: McGowen, Adrian Blackwater, MD

## 2017-10-22 NOTE — Progress Notes (Signed)
Agree with assessment and plan as outlined.  

## 2017-10-22 NOTE — Patient Instructions (Addendum)
If you are age 52 or older, your body mass index should be between 23-30. Your Body mass index is 21.01 kg/m. If this is out of the aforementioned range listed, please consider follow up with your Primary Care Provider.  If you are age 81 or younger, your body mass index should be between 19-25. Your Body mass index is 21.01 kg/m. If this is out of the aformentioned range listed, please consider follow up with your Primary Care Provider.   We have sent the following medications to your pharmacy for you to pick up at your convenience:  Omeprazole  Please follow up with Anderson Malta in 6 months.   Thank you.

## 2017-10-22 NOTE — Progress Notes (Deleted)
OFFICE VISIT  10/22/2017   CC: No chief complaint on file.  HPI:    Patient is a 52 y.o. Caucasian female who presents for 1 mo f/u GAD with recent adjustment d/o with mixed anxiety and depression sx's. Started duloxetine 30mg  qd and alpraz 0.5mg , 1-2 tid prn, #60, RF x 1.  Also referred pt to Dr. Gaynell Face for counseling. At the time of last visit visit she was also having significant GERD and dyspepsia as well as some IBS sx's. GI started her on bentyl and she was encouraged to continue her rx dose PPI and avoid NSAIDs.  Past Medical History:  Diagnosis Date  . Allergy    seasonal  . Depression   . Fatty liver 08/2017   u/s  . Fibroid uterus 08/2016   U/s at GYN  . Gestational diabetes   . History of adenomatous polyp of colon 07/2016   Recall 5 yrs (Dr. Havery Moros)  . HSV infection   . Menopausal symptoms    GYN started prometrium and vivelle 08/2016.  . Menorrhagia    + dysmenorrhea and hx of fibroids: GYN eval 07/2017->FSH normal, saline infusion u/s showed normal uterus/endomet + R ovarian cystic nodule suspected to be hemorrhagic cyst vs endometrioma.  CA 125 level normal.  Pt offered Mirena or hysterectomy and she declined both.  . Neck pain, musculoskeletal 02/06/2011  . 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 Postprandial bloating 08/2016   ? IBS.  GYN MD started Bentyl trial 08/2016.  . 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 Seasonal allergic rhinitis   . Stress fracture 2008   left ankle    Past Surgical History:  Procedure Laterality Date  . BUNIONECTOMY  08/2013   right  . CESAREAN SECTION  1997   X 1  . COLONOSCOPY W/ POLYPECTOMY  07/12/2016   Tubular adenoma: recall 07/2021.  +Diverticulosis L colon.  . cyst removed from back of right leg  6-12  . ENDOMETRIAL ABLATION  2010   HER OPTION ABLATION FOR MENORRHAGIA  . EYE SURGERY  age 37   right eye for strabismus    Outpatient Medications Prior to Visit  Medication Sig Dispense Refill  .  ALPRAZolam (XANAX) 0.5 MG tablet 1-2 tabs po tid prn anxiety 60 tablet 1  . dicyclomine (BENTYL) 10 MG capsule PLEASE SEE ATTACHED FOR DETAILED DIRECTIONS 90 capsule 0  . DULoxetine (CYMBALTA) 30 MG capsule Take 1 capsule (30 mg total) by mouth daily. 30 capsule 0  . ibuprofen (ADVIL,MOTRIN) 200 MG tablet Take 600 mg by mouth every 6 (six) hours as needed.      Marland Kitchen omeprazole (PRILOSEC) 40 MG capsule Take 1 capsule (40 mg total) by mouth daily. 30 capsule 3  . sucralfate (CARAFATE) 1 g tablet Take 1 tablet (1 g total) by mouth 4 (four) times daily -  with meals and at bedtime for 7 days. 28 tablet 0  . traMADol (ULTRAM) 50 MG tablet Take 1 tablet (50 mg total) by mouth every 6 (six) hours as needed for moderate pain. 30 tablet 0  . zolpidem (AMBIEN) 10 MG tablet Take 1 tablet (10 mg total) at bedtime as needed by mouth. 30 tablet 5   Facility-Administered Medications Prior to Visit  Medication Dose Route Frequency Provider Last Rate Last Dose  . 0.9 %  sodium chloride infusion  500 mL Intravenous Continuous Armbruster, Carlota Raspberry, MD        No Known Allergies  ROS As per HPI  PE:  There were no vitals taken for this visit. ***  LABS:    Chemistry      Component Value Date/Time   NA 137 09/10/2017 1201   K 3.7 09/10/2017 1201   CL 106 09/10/2017 1201   CO2 23 09/10/2017 1201   BUN 14 09/10/2017 1201   CREATININE 0.76 09/10/2017 1201   CREATININE 0.67 07/06/2017 1548      Component Value Date/Time   CALCIUM 9.2 09/10/2017 1201   ALKPHOS 42 09/10/2017 1201   AST 23 09/10/2017 1201   ALT 23 09/10/2017 1201   BILITOT 0.7 09/10/2017 1201      IMPRESSION AND PLAN:  No problem-specific Assessment & Plan notes found for this encounter.   FOLLOW UP: No follow-ups on file.

## 2017-10-24 ENCOUNTER — Encounter: Payer: Self-pay | Admitting: Family Medicine

## 2017-10-24 ENCOUNTER — Ambulatory Visit: Payer: BLUE CROSS/BLUE SHIELD | Admitting: Family Medicine

## 2017-10-24 VITALS — BP 90/60 | HR 94 | Temp 98.4°F | Resp 16 | Ht 63.5 in | Wt 119.2 lb

## 2017-10-24 DIAGNOSIS — F4323 Adjustment disorder with mixed anxiety and depressed mood: Secondary | ICD-10-CM | POA: Diagnosis not present

## 2017-10-24 DIAGNOSIS — F411 Generalized anxiety disorder: Secondary | ICD-10-CM | POA: Diagnosis not present

## 2017-10-24 MED ORDER — DULOXETINE HCL 60 MG PO CPEP: 60.0000 mg | ORAL_CAPSULE | Freq: Every day | ORAL | 2 refills | Status: DC

## 2017-10-24 NOTE — Progress Notes (Signed)
OFFICE VISIT  10/24/2017   CC:  Chief Complaint  Patient presents with  . Follow-up    anxiety/mood   HPI:    Patient is a 52 y.o. Caucasian female who presents for 1 mo f/u GAD with superimposed adjustment d/o with anxiety and depressed features. Started her on duloxetine 30mg  qd and alprazolam 0.5mg , 1-2 tid prn, #60, RF x 1.  Feeling much better: improved weather, improved life circumstances, and meds all designated by pt as reasons for her improvement. Feels 50% improved. No side effects.  Feels much improved physically as well as mentally. Alprazolam use tapering off quite a bit: used it twice in the last week. She has seen Dr. Gaynell Face once already and has additional appts set with her.  Dyspepsia/gastritis recently still improving and she has been instructed to continue omeprazole 40mg  qd. Her entire abd area complaints seem to be [facility] better as her anxiety and mood have improved.  Insomnia: ambien nightly is working fairly well.  Says her husband's snoring is very loud.  Past Medical History:  Diagnosis Date  . Allergy    seasonal  . Depression   . Dyspepsia 08/2017   vs gastritis  . Fatty liver 08/2017   u/s  . Fibroid uterus 08/2016   U/s at GYN  . Gestational diabetes   . History of adenomatous polyp of colon 07/2016   Recall 5 yrs (Dr. Havery Moros)  . HSV infection   . IBS (irritable bowel syndrome)   . Menopausal symptoms    GYN started prometrium and vivelle 08/2016.  . Menorrhagia    + dysmenorrhea and hx of fibroids: GYN eval 07/2017->FSH normal, saline infusion u/s showed normal uterus/endomet + R ovarian cystic nodule suspected to be hemorrhagic cyst vs endometrioma.  CA 125 level normal.  Pt offered Mirena or hysterectomy and she declined both.  . Neck pain, musculoskeletal 02/06/2011  . 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 Postprandial bloating 08/2016   ? IBS.  GYN MD started Bentyl trial 08/2016.  . 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 Seasonal allergic rhinitis   . Stress fracture 2008   left ankle    Past Surgical History:  Procedure Laterality Date  . BUNIONECTOMY  08/2013   right  . CESAREAN SECTION  1997   X 1  . COLONOSCOPY W/ POLYPECTOMY  07/12/2016   Tubular adenoma: recall 07/2021.  +Diverticulosis L colon.  . cyst removed from back of right leg  6-12  . ENDOMETRIAL ABLATION  2010   HER OPTION ABLATION FOR MENORRHAGIA  . EYE SURGERY  age 43   right eye for strabismus    Outpatient Medications Prior to Visit  Medication Sig Dispense Refill  . ALPRAZolam (XANAX) 0.5 MG tablet 1-2 tabs po tid prn anxiety 60 tablet 1  . omeprazole (PRILOSEC) 40 MG capsule Take 1 capsule (40 mg total) by mouth daily. 30 capsule 5  . traMADol (ULTRAM) 50 MG tablet Take 1 tablet (50 mg total) by mouth every 6 (six) hours as needed for moderate pain. 30 tablet 0  . zolpidem (AMBIEN) 10 MG tablet Take 1 tablet (10 mg total) at bedtime as needed by mouth. 30 tablet 5  . DULoxetine (CYMBALTA) 30 MG capsule Take 1 capsule (30 mg total) by mouth daily. 30 capsule 0  . ibuprofen (ADVIL,MOTRIN) 200 MG tablet Take 600 mg by mouth every 6 (six) hours as needed.      Marland Kitchen 0.9 %  sodium chloride infusion  No facility-administered medications prior to visit.     No Known Allergies  ROS As per HPI  PE: Blood pressure 90/60, pulse 94, temperature 98.4 F (36.9 C), temperature source Oral, resp. rate 16, height 5' 3.5" (1.613 m), weight 119 lb 4 oz (54.1 kg), last menstrual period 10/14/2017, SpO2 97 %. Gen: Alert, well appearing.  Patient is oriented to person, place, time, and situation. AFFECT: pleasant, lucid thought and speech. No further exam today.  LABS:    Chemistry      Component Value Date/Time   NA 137 09/10/2017 1201   K 3.7 09/10/2017 1201   CL 106 09/10/2017 1201   CO2 23 09/10/2017 1201   BUN 14 09/10/2017 1201   CREATININE 0.76 09/10/2017 1201   CREATININE 0.67 07/06/2017 1548      Component Value  Date/Time   CALCIUM 9.2 09/10/2017 1201   ALKPHOS 42 09/10/2017 1201   AST 23 09/10/2017 1201   ALT 23 09/10/2017 1201   BILITOT 0.7 09/10/2017 1201     Lab Results  Component Value Date   TSH 0.55 04/27/2017   Lab Results  Component Value Date   WBC 7.1 09/10/2017   HGB 13.8 09/10/2017   HCT 40.0 09/10/2017   MCV 87.5 09/10/2017   PLT 315 09/10/2017    IMPRESSION AND PLAN:  1) GAD, with superimposed adjustment d/o with anxiety and depression features. Significantly improved--about 50%. Discussed option of waiting a couple more months on current cymbalta dose to see if she continues to improve vs increasing dose to 60mg  qd today.  She was in favor of increasing dose to 60mg  qd. Continue counseling with Dr. Gaynell Face. Physical symptoms improved a lot along with improvement in anxiety level and mood. She is using the alprazolam sparingly at this time, may not need this med to the extent that regular refills are required. If regular refills end up being required then we'll get a controlled substance contract reviewed/signed/in chart.  An After Visit Summary was printed and given to the patient.  FOLLOW UP: Return in about 3 months (around 01/23/2018) for f/u mood/anxiety.  Signed:  Crissie Sickles, MD           10/24/2017

## 2017-10-25 DIAGNOSIS — G5603 Carpal tunnel syndrome, bilateral upper limbs: Secondary | ICD-10-CM | POA: Diagnosis not present

## 2017-10-25 DIAGNOSIS — M5417 Radiculopathy, lumbosacral region: Secondary | ICD-10-CM | POA: Diagnosis not present

## 2017-10-25 DIAGNOSIS — M5412 Radiculopathy, cervical region: Secondary | ICD-10-CM | POA: Diagnosis not present

## 2017-10-30 ENCOUNTER — Other Ambulatory Visit: Payer: Self-pay | Admitting: Family Medicine

## 2017-10-30 NOTE — Telephone Encounter (Signed)
Not my patient. Please assess refill request.  Thanks!

## 2017-11-08 ENCOUNTER — Other Ambulatory Visit: Payer: Self-pay | Admitting: *Deleted

## 2017-11-08 MED ORDER — ZOLPIDEM TARTRATE 10 MG PO TABS: 10.0000 mg | ORAL_TABLET | Freq: Every evening | ORAL | 5 refills | Status: DC | PRN

## 2017-11-08 NOTE — Telephone Encounter (Signed)
Rx faxed

## 2017-11-08 NOTE — Telephone Encounter (Signed)
[facility]  RF request for zolpidem LOV: 10/24/17 Next ov: None Last written: 05/14/17 #30 w/ 5RF  Please advise. Thanks.

## 2017-11-21 ENCOUNTER — Other Ambulatory Visit: Payer: Self-pay | Admitting: Family Medicine

## 2017-11-29 ENCOUNTER — Ambulatory Visit: Payer: BLUE CROSS/BLUE SHIELD | Admitting: Clinical

## 2017-12-06 ENCOUNTER — Ambulatory Visit: Payer: BLUE CROSS/BLUE SHIELD | Admitting: Clinical

## 2017-12-06 DIAGNOSIS — F4323 Adjustment disorder with mixed anxiety and depressed mood: Secondary | ICD-10-CM

## 2017-12-13 ENCOUNTER — Ambulatory Visit: Payer: BLUE CROSS/BLUE SHIELD | Admitting: Clinical

## 2017-12-13 DIAGNOSIS — F4323 Adjustment disorder with mixed anxiety and depressed mood: Secondary | ICD-10-CM

## 2018-01-22 ENCOUNTER — Ambulatory Visit: Payer: BLUE CROSS/BLUE SHIELD | Admitting: Clinical

## 2018-01-22 DIAGNOSIS — F4323 Adjustment disorder with mixed anxiety and depressed mood: Secondary | ICD-10-CM | POA: Diagnosis not present

## 2018-01-31 DIAGNOSIS — G5603 Carpal tunnel syndrome, bilateral upper limbs: Secondary | ICD-10-CM | POA: Diagnosis not present

## 2018-01-31 DIAGNOSIS — R270 Ataxia, unspecified: Secondary | ICD-10-CM | POA: Diagnosis not present

## 2018-01-31 DIAGNOSIS — M5417 Radiculopathy, lumbosacral region: Secondary | ICD-10-CM | POA: Diagnosis not present

## 2018-01-31 DIAGNOSIS — R202 Paresthesia of skin: Secondary | ICD-10-CM | POA: Diagnosis not present

## 2018-02-26 ENCOUNTER — Other Ambulatory Visit: Payer: Self-pay | Admitting: Family Medicine

## 2018-03-06 NOTE — Telephone Encounter (Signed)
Pt advised and voiced understanding.    Apt made for 03/19/18 at 8:45am.

## 2018-03-19 ENCOUNTER — Encounter: Payer: Self-pay | Admitting: *Deleted

## 2018-03-19 ENCOUNTER — Encounter: Payer: Self-pay | Admitting: Family Medicine

## 2018-03-19 ENCOUNTER — Ambulatory Visit: Payer: BLUE CROSS/BLUE SHIELD | Admitting: Family Medicine

## 2018-03-19 VITALS — BP 112/78 | HR 84 | Temp 98.8°F | Resp 16 | Ht 63.5 in | Wt 125.1 lb

## 2018-03-19 DIAGNOSIS — Z79899 Other long term (current) drug therapy: Secondary | ICD-10-CM | POA: Diagnosis not present

## 2018-03-19 DIAGNOSIS — G4700 Insomnia, unspecified: Secondary | ICD-10-CM

## 2018-03-19 DIAGNOSIS — N946 Dysmenorrhea, unspecified: Secondary | ICD-10-CM

## 2018-03-19 DIAGNOSIS — Z23 Encounter for immunization: Secondary | ICD-10-CM | POA: Diagnosis not present

## 2018-03-19 DIAGNOSIS — F411 Generalized anxiety disorder: Secondary | ICD-10-CM

## 2018-03-19 MED ORDER — ZOLPIDEM TARTRATE 10 MG PO TABS: 10.0000 mg | ORAL_TABLET | Freq: Every evening | ORAL | 1 refills | Status: DC | PRN

## 2018-03-19 MED ORDER — ALPRAZOLAM 0.5 MG PO TABS: ORAL_TABLET | ORAL | 1 refills | Status: DC

## 2018-03-19 MED ORDER — TRAMADOL HCL 50 MG PO TABS: ORAL_TABLET | ORAL | 1 refills | Status: DC

## 2018-03-19 MED ORDER — DULOXETINE HCL 60 MG PO CPEP: 60.0000 mg | ORAL_CAPSULE | Freq: Every day | ORAL | 3 refills | Status: DC

## 2018-03-19 NOTE — Progress Notes (Signed)
OFFICE VISIT  03/19/2018   CC:  Chief Complaint  Patient presents with  . Follow-up    RCI, pt is fasting.    HPI:    Patient is a 52 y.o. Caucasian female who presents for 6 mo f/u GAD with recent superimposed adjustment d/o with mixed anxiety and depression features. Last f/u visit she was 50% improved after getting on dulox 30mg  and using xanax prn AND getting counseling with Dr. Gaynell Face. We increased her duloxetine to 60mg  qd and she was already tapering off use of her alprazolam. She was doing well on ambien hs for insomnia at that time as well.  She is doing well still, feels about 75% improved and is happy with how she is doing. Alprazolam use is infrequent, pt feels a lot of generalized worry and anxiety and thinks negatively.  Alprazolam does help. Still in counseling with Dr. Gaynell Face and finds this helpful.  Pt does not want to take alpraz on a scheduled basis.  Uses ambien every night and finds this very helpful.  No hangover effect.  Rest is restorative. Husband snores very loudly and this is a big factor in her sleep problems.  Used tramadol for her menses pain relatively recently.  This worked better than ibup, plus we would rather avoid NSAIDs given her GI intolerability.  Menses fairly irregular, but cramping severe for 2 days.  Past Medical History:  Diagnosis Date  . Allergy    seasonal  . Depression   . Dysmenorrhea   . Dyspepsia 08/2017   vs gastritis.  MUCH improved with PPI + getting anxiety controlled.  . Fatty liver 08/2017   u/s  . Fibroid uterus 08/2016   U/s at GYN  . GAD (generalized anxiety disorder)   . Gestational diabetes   . History of adenomatous polyp of colon 07/2016   Recall 5 yrs (Dr. Havery Moros)  . HSV infection   . IBS (irritable bowel syndrome)   . Insomnia   . Menopausal symptoms    GYN started prometrium and vivelle 08/2016.  . Menorrhagia    + dysmenorrhea and hx of fibroids: GYN eval 07/2017->FSH normal, saline infusion  u/s showed normal uterus/endomet + R ovarian cystic nodule suspected to be hemorrhagic cyst vs endometrioma.  CA 125 level normal.  Pt offered Mirena or hysterectomy and she declined both.  . Neck pain, musculoskeletal 02/06/2011  . 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 Postprandial bloating 08/2016   ? IBS.  GYN MD started Bentyl trial 08/2016.  . 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 Seasonal allergic rhinitis   . Stress fracture 2008   left ankle    Past Surgical History:  Procedure Laterality Date  . BUNIONECTOMY  08/2013   right  . CESAREAN SECTION  1997   X 1  . COLONOSCOPY W/ POLYPECTOMY  07/12/2016   Tubular adenoma: recall 07/2021.  +Diverticulosis L colon.  . cyst removed from back of right leg  6-12  . ENDOMETRIAL ABLATION  2010   HER OPTION ABLATION FOR MENORRHAGIA  . EYE SURGERY  age 27   right eye for strabismus    Outpatient Medications Prior to Visit  Medication Sig Dispense Refill  . omeprazole (PRILOSEC) 40 MG capsule Take 1 capsule (40 mg total) by mouth daily. 30 capsule 5  . ALPRAZolam (XANAX) 0.5 MG tablet 1-2 tabs po tid prn anxiety 60 tablet 1  . DULoxetine (CYMBALTA) 60 MG capsule Take 1 capsule (60 mg total) by mouth daily. OFFICE  VISIT NEEDED FOR MORE REFILLS 90 capsule 0  . zolpidem (AMBIEN) 10 MG tablet Take 1 tablet (10 mg total) by mouth at bedtime as needed. 30 tablet 5  . traMADol (ULTRAM) 50 MG tablet Take 1 tablet (50 mg total) by mouth every 6 (six) hours as needed for moderate pain. (Patient not taking: Reported on 03/19/2018) 30 tablet 0   No facility-administered medications prior to visit.     No Known Allergies  ROS As per HPI  PE: Blood pressure 112/78, pulse 84, temperature 98.8 F (37.1 C), temperature source Oral, resp. rate 16, height 5' 3.5" (1.613 m), weight 125 lb 2 oz (56.8 kg), last menstrual period 03/01/2018, SpO2 97 %. Body mass index is 21.82 kg/m.  Wt Readings from Last 2 Encounters:  03/19/18 125 lb 2 oz  (56.8 kg)  10/24/17 119 lb 4 oz (54.1 kg)    Gen: alert, oriented x 4, affect pleasant.  Lucid thinking and conversation noted. HEENT: PERRLA, EOMI.   Neck: no LAD, mass, or thyromegaly. CV: RRR, no m/r/g LUNGS: CTA bilat, nonlabored. NEURO: no tremor or tics noted on observation.  Coordination intact. CN 2-12 grossly intact bilaterally, strength 5/5 in all extremeties.  No ataxia.   LABS:  Lab Results  Component Value Date   TSH 0.55 04/27/2017   Lab Results  Component Value Date   WBC 7.1 09/10/2017   HGB 13.8 09/10/2017   HCT 40.0 09/10/2017   MCV 87.5 09/10/2017   PLT 315 09/10/2017   Lab Results  Component Value Date   CREATININE 0.76 09/10/2017   BUN 14 09/10/2017   NA 137 09/10/2017   K 3.7 09/10/2017   CL 106 09/10/2017   CO2 23 09/10/2017   Lab Results  Component Value Date   ALT 23 09/10/2017   AST 23 09/10/2017   ALKPHOS 42 09/10/2017   BILITOT 0.7 09/10/2017   Lab Results  Component Value Date   CHOL 192 04/27/2017   Lab Results  Component Value Date   HDL 79.30 04/27/2017   Lab Results  Component Value Date   LDLCALC 101 (H) 04/27/2017   Lab Results  Component Value Date   TRIG 60.0 04/27/2017   Lab Results  Component Value Date   CHOLHDL 2 04/27/2017   Lab Results  Component Value Date   HGBA1C 5.4 09/18/2017    IMPRESSION AND PLAN:  1) GAD, with panic: doing well/stable.  Continue with cymbalta 60mg  qd, alpraz 0.5mg  prn severe anxiety, and counseling with Dr. Gaynell Face.  2) Insomnia: doing well on nightly ambien. New rx given today.  3) Dysmenorrhea, with DUB. She is working with her GYN regarding her DUB, which seems to be much less of an issue of late.  For her painful menstrual cramps, I will keep her on prn use of tramadol and avoid NSAIDs for her since she her significant sensitive GI tract/NSAID intolerability.  Controlled substance contract reviewed with patient today.  Patient signed this and it will be placed in the  chart.    UDS obtained today: alprazolam was taken about 1 week ago or more, ambien used nightly, tramadol no use recently).   An After Visit Summary was printed and given to the patient.  FOLLOW UP: No follow-ups on file.  Signed:  Crissie Sickles, MD           03/19/2018

## 2018-03-22 LAB — PAIN MGMT, PROFILE 8 W/CONF, U
6 Acetylmorphine: NEGATIVE ng/mL (ref <10)
Alcohol Metabolites: POSITIVE ng/mL — AB (ref <500)
Amphetamines: NEGATIVE ng/mL (ref <500)
Benzodiazepines: NEGATIVE ng/mL (ref <100)
Buprenorphine, Urine: NEGATIVE ng/mL (ref <5)
Cocaine Metabolite: NEGATIVE ng/mL (ref <150)
Creatinine: 136.9 mg/dL
Ethyl Glucuronide (ETG): 5067 ng/mL — ABNORMAL HIGH (ref <500)
Ethyl Sulfate (ETS): 977 ng/mL — ABNORMAL HIGH (ref <100)
MDMA: NEGATIVE ng/mL (ref <500)
Marijuana Metabolite: NEGATIVE ng/mL (ref <20)
Opiates: NEGATIVE ng/mL (ref <100)
Oxidant: NEGATIVE ug/mL (ref <200)
Oxycodone: NEGATIVE ng/mL (ref <100)
pH: 6.38 (ref 4.5–9.0)

## 2018-05-09 DIAGNOSIS — M5412 Radiculopathy, cervical region: Secondary | ICD-10-CM | POA: Diagnosis not present

## 2018-05-09 DIAGNOSIS — R202 Paresthesia of skin: Secondary | ICD-10-CM | POA: Diagnosis not present

## 2018-05-09 DIAGNOSIS — G5603 Carpal tunnel syndrome, bilateral upper limbs: Secondary | ICD-10-CM | POA: Diagnosis not present

## 2018-05-09 DIAGNOSIS — M5417 Radiculopathy, lumbosacral region: Secondary | ICD-10-CM | POA: Diagnosis not present

## 2018-05-09 DIAGNOSIS — R270 Ataxia, unspecified: Secondary | ICD-10-CM | POA: Diagnosis not present

## 2018-06-02 ENCOUNTER — Other Ambulatory Visit: Payer: Self-pay | Admitting: Physician Assistant

## 2018-08-15 DIAGNOSIS — G589 Mononeuropathy, unspecified: Secondary | ICD-10-CM | POA: Diagnosis not present

## 2018-08-15 DIAGNOSIS — R202 Paresthesia of skin: Secondary | ICD-10-CM | POA: Diagnosis not present

## 2018-08-15 DIAGNOSIS — M2550 Pain in unspecified joint: Secondary | ICD-10-CM | POA: Diagnosis not present

## 2018-08-15 DIAGNOSIS — E559 Vitamin D deficiency, unspecified: Secondary | ICD-10-CM | POA: Diagnosis not present

## 2018-09-03 ENCOUNTER — Other Ambulatory Visit: Payer: Self-pay | Admitting: Obstetrics and Gynecology

## 2018-09-03 DIAGNOSIS — Z1231 Encounter for screening mammogram for malignant neoplasm of breast: Secondary | ICD-10-CM

## 2018-09-04 ENCOUNTER — Ambulatory Visit
Admission: RE | Admit: 2018-09-04 | Discharge: 2018-09-04 | Disposition: A | Discharge status: Home / Self Care | Payer: Commercial Managed Care - PPO | Source: Ambulatory Visit | Attending: Obstetrics and Gynecology | Admitting: Obstetrics and Gynecology

## 2018-09-04 DIAGNOSIS — Z1231 Encounter for screening mammogram for malignant neoplasm of breast: Secondary | ICD-10-CM

## 2018-10-22 ENCOUNTER — Telehealth: Payer: Self-pay

## 2018-10-22 ENCOUNTER — Other Ambulatory Visit: Payer: Self-pay | Admitting: Family Medicine

## 2018-10-22 MED ORDER — ZOLPIDEM TARTRATE 10 MG PO TABS: 10.0000 mg | ORAL_TABLET | Freq: Every evening | ORAL | 0 refills | Status: DC | PRN

## 2018-10-22 NOTE — Telephone Encounter (Signed)
I'll send in #30, no Rf. Needs q 6 mo o/v for f/u insomnia since she is on this med--> last visit was >7 mo ago. Virtual visit needed prior to any FURTHER RF's.-thx

## 2018-10-22 NOTE — Telephone Encounter (Signed)
RF request for Zolpidem LOV: 03/19/18 Next ov: none Last written: 03/19/18 #90 w/ 1RF Last CSC and UDS: 03/19/18  PMP aware printed. Placed on providers desk.

## 2018-10-22 NOTE — Telephone Encounter (Signed)
Pt was called and scheduled for tomorrow

## 2018-10-23 ENCOUNTER — Other Ambulatory Visit: Payer: Self-pay

## 2018-10-23 ENCOUNTER — Encounter: Payer: Self-pay | Admitting: Family Medicine

## 2018-10-23 ENCOUNTER — Ambulatory Visit (INDEPENDENT_AMBULATORY_CARE_PROVIDER_SITE_OTHER): Payer: Commercial Managed Care - PPO | Admitting: Family Medicine

## 2018-10-23 VITALS — Wt 135.0 lb

## 2018-10-23 DIAGNOSIS — F411 Generalized anxiety disorder: Secondary | ICD-10-CM

## 2018-10-23 DIAGNOSIS — F3342 Major depressive disorder, recurrent, in full remission: Secondary | ICD-10-CM | POA: Diagnosis not present

## 2018-10-23 DIAGNOSIS — F5104 Psychophysiologic insomnia: Secondary | ICD-10-CM

## 2018-10-23 MED ORDER — DULOXETINE HCL 60 MG PO CPEP: 60.0000 mg | ORAL_CAPSULE | Freq: Every day | ORAL | 5 refills | Status: DC

## 2018-10-23 MED ORDER — ZOLPIDEM TARTRATE 10 MG PO TABS: 10.0000 mg | ORAL_TABLET | Freq: Every evening | ORAL | 5 refills | Status: DC | PRN

## 2018-10-23 MED ORDER — ALPRAZOLAM 0.5 MG PO TABS: ORAL_TABLET | ORAL | 1 refills | Status: DC

## 2018-10-23 NOTE — Progress Notes (Signed)
Virtual Visit via Video Note  I connected with Karen Oconnell  on 10/23/18 at  9:40 AM EDT by a video enabled telemedicine application and verified that I am speaking with the correct person using two identifiers.  Location patient: home Location provider:work or home office Persons participating in the virtual visit: patient, provider  I discussed the limitations of evaluation and management by telemedicine and the availability of in person appointments. The patient expressed understanding and agreed to proceed.  Telemedicine visit is a necessity given the COVID-19 restrictions in place at the current time.  HPI: 53 y/o WF with GAD and hx of MDD who is being seen for f/u insomnia.  Insomnia: Ambien nightly helpful. In the past her husband's loud snoring has played a big role in her sleep problems.  Rest is restorative on the ambien, no adverse effects noted.  Anxiety has been of minimal problem except for when the covid 19 crisis started, which got her a bit anxious and she did have to take 1 or 2 alprazolam.  Otherwise she has not needed alprazolam and has been steady with taking her duloxetine. No depression at all lately.  Overall doing very GOOD.  CSC and UDS were done 03/2018.  ROS: See pertinent positives and negatives per HPI.  Past Medical History:  Diagnosis Date  . Allergy    seasonal  . Depression   . Dysmenorrhea   . Dyspepsia 08/2017   vs gastritis.  MUCH improved with PPI + getting anxiety controlled.  . Fatty liver 08/2017   u/s  . Fibroid uterus 08/2016   U/s at GYN  . GAD (generalized anxiety disorder)   . Gestational diabetes   . History of adenomatous polyp of colon 07/2016   Recall 5 yrs (Dr. Havery Moros)  . HSV infection   . IBS (irritable bowel syndrome)   . Insomnia   . Menopausal symptoms    GYN started prometrium and vivelle 08/2016.  . Menorrhagia    + dysmenorrhea and hx of fibroids: GYN eval 07/2017->FSH normal, saline infusion u/s showed normal  uterus/endomet + R ovarian cystic nodule suspected to be hemorrhagic cyst vs endometrioma.  CA 125 level normal.  Karen Oconnell offered Mirena or hysterectomy and she declined both.  . Neck pain, musculoskeletal 02/06/2011  . 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 Postprandial bloating 08/2016   ? IBS.  GYN MD started Bentyl trial 08/2016.  . 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 Seasonal allergic rhinitis   . Stress fracture 2008   left ankle    Past Surgical History:  Procedure Laterality Date  . BUNIONECTOMY  08/2013   right  . CESAREAN SECTION  1997   X 1  . COLONOSCOPY W/ POLYPECTOMY  07/12/2016   Tubular adenoma: recall 07/2021.  +Diverticulosis L colon.  . cyst removed from back of right leg  6-12  . ENDOMETRIAL ABLATION  2010   HER OPTION ABLATION FOR MENORRHAGIA  . EYE SURGERY  age 89   right eye for strabismus    Family History  Problem Relation Age of Onset  . Diabetes Mother        Type 2  . Hypertension Mother   . Hyperlipidemia Mother   . Depression Mother   . Seasonal affective disorder Father   . Cancer Father        MELANOMA  . Bipolar disorder Brother   . Drug abuse Brother        cocaine, vicodin, pain killers  . ADD /  ADHD Son   . Depression Son   . Cancer Maternal Grandmother        gyn possibly cervical or uterine  . Diabetes Maternal Grandfather        Type 2  . Hypertension Maternal Grandfather   . Depression Maternal Grandfather   . Alcohol abuse Paternal Grandfather   . Colon cancer Neg Hx   . Esophageal cancer Neg Hx   . Rectal cancer Neg Hx   . Stomach cancer Neg Hx     Social History   Socioeconomic History  . Marital status: Married    Spouse name: Not on file  . Number of children: Not on file  . Years of education: Not on file  . Highest education level: Not on file  Occupational History  . Not on file  Social Needs  . Financial resource strain: Not on file  . Food insecurity:    Worry: Not on file    Inability: Not on  file  . Transportation needs:    Medical: Not on file    Non-medical: Not on file  Tobacco Use  . Smoking status: Never Smoker  . Smokeless tobacco: Never Used  Substance and Sexual Activity  . Alcohol use: Yes    Alcohol/week: 0.0 standard drinks    Comment: occasionaly  . Drug use: No  . Sexual activity: Yes    Partners: Male    Birth control/protection: Other-see comments, Surgical    Comment: husband vasectomy  Lifestyle  . Physical activity:    Days per week: Not on file    Minutes per session: Not on file  . Stress: Not on file  Relationships  . Social connections:    Talks on phone: Not on file    Gets together: Not on file    Attends religious service: Not on file    Active member of club or organization: Not on file    Attends meetings of clubs or organizations: Not on file    Relationship status: Not on file  Other Topics Concern  . 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   No T/A/Ds.    Current Outpatient Medications:  .  ALPRAZolam (XANAX) 0.5 MG tablet, 1-2 tabs po tid prn anxiety, Disp: 60 tablet, Rfl: 1 .  DULoxetine (CYMBALTA) 60 MG capsule, Take 1 capsule (60 mg total) by mouth daily. OFFICE VISIT NEEDED FOR MORE REFILLS, Disp: 90 capsule, Rfl: 3 .  omeprazole (PRILOSEC) 40 MG capsule, Take 1 capsule (40 mg total) by mouth daily., Disp: 90 capsule, Rfl: 3 .  traMADol (ULTRAM) 50 MG tablet, 1-2 tabs po tid prn menstrual cramps, Disp: 40 tablet, Rfl: 1 .  zolpidem (AMBIEN) 10 MG tablet, Take 1 tablet (10 mg total) by mouth at bedtime as needed., Disp: 30 tablet, Rfl: 0  EXAM:  VITALS per patient if applicable: Wt 135 lb (61.2 kg)   BMI 23.54 kg/m    GENERAL: alert, oriented, appears well and in no acute distress  HEENT: atraumatic, conjunttiva clear, no obvious abnormalities on inspection of external nose and ears  NECK: normal movements of the head and neck  LUNGS: on inspection no  signs of respiratory distress, breathing rate appears normal, no obvious gross SOB, gasping or wheezing  CV: no obvious cyanosis  MS: moves all visible extremities without noticeable abnormality  PSYCH/NEURO: pleasant and cooperative, no obvious depression or anxiety, speech and thought processing grossly intact  LABS:  none today    Chemistry      Component Value Date/Time   NA 137 09/10/2017 1201   K 3.7 09/10/2017 1201   CL 106 09/10/2017 1201   CO2 23 09/10/2017 1201   BUN 14 09/10/2017 1201   CREATININE 0.76 09/10/2017 1201   CREATININE 0.67 07/06/2017 1548      Component Value Date/Time   CALCIUM 9.2 09/10/2017 1201   ALKPHOS 42 09/10/2017 1201   AST 23 09/10/2017 1201   ALT 23 09/10/2017 1201   BILITOT 0.7 09/10/2017 1201     ASSESSMENT AND PLAN:  Discussed the following assessment and plan:  1) Insomnia: doing well with sleep hygiene and is responding well still to nightly use of ambien. I did electronic rx's for ambien 10mg , 1 qhs, #30, RF x 5  today.   CSC and UDS are UTD--will renew 03/2019.   2) GAD, hx of MDD: The current medical regimen is effective;  continue present plan and medications. I did electronic rx's for alprazolam 0.5 mg, 1-2 tid prn anxiety, #60, RF x 1 today. She'll continue duloxetine 60 mg qd--RFd x 6 mo today.  I discussed the assessment and treatment plan with the patient. The patient was provided an opportunity to ask questions and all were answered. The patient agreed with the plan and demonstrated an understanding of the instructions.   The patient was advised to call back or seek an in-person evaluation if the symptoms worsen or if the condition fails to improve as anticipated.  F/u: 6 mo CPE  Signed:  Crissie Sickles, MD           10/23/2018

## 2018-12-04 ENCOUNTER — Encounter: Payer: Self-pay | Admitting: Family Medicine

## 2018-12-04 ENCOUNTER — Other Ambulatory Visit: Payer: Self-pay

## 2018-12-04 ENCOUNTER — Ambulatory Visit (INDEPENDENT_AMBULATORY_CARE_PROVIDER_SITE_OTHER): Payer: Commercial Managed Care - PPO | Admitting: Family Medicine

## 2018-12-04 DIAGNOSIS — N3000 Acute cystitis without hematuria: Secondary | ICD-10-CM

## 2018-12-04 MED ORDER — SULFAMETHOXAZOLE-TRIMETHOPRIM 800-160 MG PO TABS: ORAL_TABLET | ORAL | 0 refills | Status: DC

## 2018-12-04 NOTE — Progress Notes (Signed)
Virtual Visit via Video Note  I connected with Karen Oconnell on 12/04/18 at 11:20 AM EDT by a video enabled telemedicine application and verified that I am speaking with the correct person using two identifiers.  Location patient: home Location provider:work or home office Persons participating in the virtual visit: patient, provider  I discussed the limitations of evaluation and management by telemedicine and the availability of in person appointments. The patient expressed understanding and agreed to proceed.  Telemedicine visit is a necessity given the COVID-19 restrictions in place at the current time.  HPI: 53 y/o WF being seen today for urinary complaints. Onset 2 d/a, urinary urgency and frequency and dysuria.  No fever.  Drinking lots of water and cranberry juice. Took ibup.  Sx's getting worse.  Urine cloudy.  No AZO. No n/v, no flank pain/back pain.  Some suprapubic discomfort.    Past Medical History:  Diagnosis Date  . Allergy    seasonal  . Depression   . Dysmenorrhea   . Dyspepsia 08/2017   vs gastritis.  MUCH improved with PPI + getting anxiety controlled.  . Fatty liver 08/2017   u/s  . Fibroid uterus 08/2016   U/s at GYN  . GAD (generalized anxiety disorder)   . Gestational diabetes   . History of adenomatous polyp of colon 07/2016   Recall 5 yrs (Dr. Havery Moros)  . HSV infection   . IBS (irritable bowel syndrome)   . Insomnia   . Menopausal symptoms    GYN started prometrium and vivelle 08/2016.  . Menorrhagia    + dysmenorrhea and hx of fibroids: GYN eval 07/2017->FSH normal, saline infusion u/s showed normal uterus/endomet + R ovarian cystic nodule suspected to be hemorrhagic cyst vs endometrioma.  CA 125 level normal.  Karen Oconnell offered Mirena or hysterectomy and she declined both.  . Neck pain, musculoskeletal 02/06/2011  . 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 Postprandial bloating 08/2016   ? IBS.  GYN MD started Bentyl trial 08/2016.  . 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 Seasonal allergic rhinitis   . Stress fracture 2008   left ankle    Past Surgical History:  Procedure Laterality Date  . BUNIONECTOMY  08/2013   right  . CESAREAN SECTION  1997   X 1  . COLONOSCOPY W/ POLYPECTOMY  07/12/2016   Tubular adenoma: recall 07/2021.  +Diverticulosis L colon.  . cyst removed from back of right leg  6-12  . ENDOMETRIAL ABLATION  2010   HER OPTION ABLATION FOR MENORRHAGIA  . EYE SURGERY  age 64   right eye for strabismus    Family History  Problem Relation Age of Onset  . Diabetes Mother        Type 2  . Hypertension Mother   . Hyperlipidemia Mother   . Depression Mother   . Seasonal affective disorder Father   . Cancer Father        MELANOMA  . Bipolar disorder Brother   . Drug abuse Brother        cocaine, vicodin, pain killers  . ADD / ADHD Son   . Depression Son   . Cancer Maternal Grandmother        gyn possibly cervical or uterine  . Diabetes Maternal Grandfather        Type 2  . Hypertension Maternal Grandfather   . Depression Maternal Grandfather   . Alcohol abuse Paternal Grandfather   . Colon cancer Neg Hx   . Esophageal  cancer Neg Hx   . Rectal cancer Neg Hx   . Stomach cancer Neg Hx     SOCIAL HX:  NO T/A/Ds   Current Outpatient Medications:  .  ALPRAZolam (XANAX) 0.5 MG tablet, 1-2 tabs po tid prn anxiety, Disp: 60 tablet, Rfl: 1 .  DULoxetine (CYMBALTA) 60 MG capsule, Take 1 capsule (60 mg total) by mouth daily., Disp: 30 capsule, Rfl: 5 .  ibuprofen (ADVIL) 200 MG tablet, Take 200 mg by mouth every 6 (six) hours as needed., Disp: , Rfl:  .  omeprazole (PRILOSEC) 40 MG capsule, Take 1 capsule (40 mg total) by mouth daily., Disp: 90 capsule, Rfl: 3 .  traMADol (ULTRAM) 50 MG tablet, 1-2 tabs po tid prn menstrual cramps, Disp: 40 tablet, Rfl: 1 .  zolpidem (AMBIEN) 10 MG tablet, Take 1 tablet (10 mg total) by mouth at bedtime as needed., Disp: 30 tablet, Rfl: 5  EXAM:  VITALS per patient if  applicable:  GENERAL: alert, oriented, appears well and in no acute distress  HEENT: atraumatic, conjunttiva clear, no obvious abnormalities on inspection of external nose and ears  NECK: normal movements of the head and neck  LUNGS: on inspection no signs of respiratory distress, breathing rate appears normal, no obvious gross SOB, gasping or wheezing  CV: no obvious cyanosis  MS: moves all visible extremities without noticeable abnormality  PSYCH/NEURO: pleasant and cooperative, no obvious depression or anxiety, speech and thought processing grossly intact  ASSESSMENT AND PLAN:  Discussed the following assessment and plan:  1) Acute lower UTI. Her sx's are classic for UTI w/out complication so I'll forego UA today in order to avoid having her come in for lab. Bactrim DS 1 bid x 3d. Signs/symptoms to call or return for were reviewed and Karen Oconnell expressed understanding. If no improvement with sx's in next 2d then we'll have her come to give urine specimen.  I discussed the assessment and treatment plan with the patient. The patient was provided an opportunity to ask questions and all were answered. The patient agreed with the plan and demonstrated an understanding of the instructions.   The patient was advised to call back or seek an in-person evaluation if the symptoms worsen or if the condition fails to improve as anticipated.  F/u: if sx's not improving in 2 d or if worse prior.  Signed:  Crissie Sickles, MD           12/04/2018

## 2018-12-13 ENCOUNTER — Other Ambulatory Visit: Payer: Self-pay

## 2018-12-13 NOTE — Telephone Encounter (Signed)
RF request for Tramadol LOV: 12/04/18, acute Next ov: 6 mo CPE Last written: 03/19/18 (40,1) Last CSC and UDS: 03/19/18  Please advise, thanks. Medication pending

## 2018-12-14 MED ORDER — TRAMADOL HCL 50 MG PO TABS: ORAL_TABLET | ORAL | 1 refills | Status: DC

## 2019-05-01 ENCOUNTER — Other Ambulatory Visit: Payer: Self-pay

## 2019-05-01 MED ORDER — DULOXETINE HCL 60 MG PO CPEP: 60.0000 mg | ORAL_CAPSULE | Freq: Every day | ORAL | 1 refills | Status: DC

## 2019-05-05 ENCOUNTER — Other Ambulatory Visit: Payer: Self-pay | Admitting: Family Medicine

## 2019-05-06 NOTE — Telephone Encounter (Signed)
OK, will do rx's but pt needs o/v in 4-6 wks for f/u anxiety/insomnia or for CPE (her choice).-thx

## 2019-05-06 NOTE — Telephone Encounter (Signed)
Requesting: xanax and ambien Contract: 03/19/2018 UDS: 03/19/2018 Last Visit: 12/04/2018 Next Visit: not scheuled Last Refill: 10/23/2018  Patient is due for a follow-up this month. Mychart message has been sent to patient    Please Advise

## 2019-05-13 ENCOUNTER — Other Ambulatory Visit: Payer: Self-pay

## 2019-05-14 ENCOUNTER — Ambulatory Visit (INDEPENDENT_AMBULATORY_CARE_PROVIDER_SITE_OTHER): Payer: Commercial Managed Care - PPO | Admitting: Family Medicine

## 2019-05-14 ENCOUNTER — Other Ambulatory Visit: Payer: Self-pay

## 2019-05-14 ENCOUNTER — Encounter: Payer: Self-pay | Admitting: Family Medicine

## 2019-05-14 VITALS — BP 129/83 | HR 86 | Temp 98.3°F | Resp 16 | Ht 63.0 in | Wt 140.0 lb

## 2019-05-14 DIAGNOSIS — F411 Generalized anxiety disorder: Secondary | ICD-10-CM | POA: Diagnosis not present

## 2019-05-14 DIAGNOSIS — G4700 Insomnia, unspecified: Secondary | ICD-10-CM

## 2019-05-14 DIAGNOSIS — H1013 Acute atopic conjunctivitis, bilateral: Secondary | ICD-10-CM

## 2019-05-14 DIAGNOSIS — F3342 Major depressive disorder, recurrent, in full remission: Secondary | ICD-10-CM | POA: Diagnosis not present

## 2019-05-14 DIAGNOSIS — N946 Dysmenorrhea, unspecified: Secondary | ICD-10-CM

## 2019-05-14 MED ORDER — OLOPATADINE HCL 0.1 % OP SOLN: 1.0000 [drp] | Freq: Two times a day (BID) | OPHTHALMIC | 12 refills | Status: DC

## 2019-05-14 NOTE — Progress Notes (Signed)
OFFICE VISIT  05/14/2019   CC:  Chief Complaint  Patient presents with  . Follow-up    RCI     HPI:    Patient is a 53 y.o. Caucasian female who presents for 6 mo f/u GAD, hx of MDD, and insomnia. Teaching at [employer].   PMP AWARE reviewed today: most recent alprazolam rx filled was 05/06/19, #60, rx by me. Ambien rx 05/06/19, #30, rx by me. Tramadol 12/14/18, #40, rx by me. No red flags.  Insomnia: takes ambien nightly and can't sleep otherwise.  No adverse side effects.   Anx and mood: taking cymbalta every day.  Rarely takes alprazolam. She has taken tramadol for severe menstrual cramps and her last dose was Oct. Menses are getting irreg so her use of this has decreased.  Lots of itchy, runny eyes on/off lately, has environmental allergies, uses antihistamine daily and recent tried otc antihistamine eye gtts--some help. Minimal nasal congestion/runny nose.  Some sneezing.  No eye pain or mucous drainage.  No vision c/o.   Past Medical History:  Diagnosis Date  . Allergy    seasonal  . Depression   . Dysmenorrhea   . Dyspepsia 08/2017   vs gastritis.  MUCH improved with PPI + getting anxiety controlled.  . Fatty liver 08/2017   u/s  . Fibroid uterus 08/2016   U/s at GYN  . GAD (generalized anxiety disorder)   . Gestational diabetes   . History of adenomatous polyp of colon 07/2016   Recall 5 yrs (Dr. Havery Moros)  . HSV infection   . IBS (irritable bowel syndrome)   . Insomnia   . Menopausal symptoms    GYN started prometrium and vivelle 08/2016.  . Menorrhagia    + dysmenorrhea and hx of fibroids: GYN eval 07/2017->FSH normal, saline infusion u/s showed normal uterus/endomet + R ovarian cystic nodule suspected to be hemorrhagic cyst vs endometrioma.  CA 125 level normal.  Pt offered Mirena or hysterectomy and she declined both.  . Neck pain, musculoskeletal 02/06/2011  . 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 Postprandial bloating 08/2016   ?  IBS.  GYN MD started Bentyl trial 08/2016.  . 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 Seasonal allergic rhinitis   . Stress fracture 2008   left ankle    Past Surgical History:  Procedure Laterality Date  . BUNIONECTOMY  08/2013   right  . CESAREAN SECTION  1997   X 1  . COLONOSCOPY W/ POLYPECTOMY  07/12/2016   Tubular adenoma: recall 07/2021.  +Diverticulosis L colon.  . cyst removed from back of right leg  6-12  . ENDOMETRIAL ABLATION  2010   HER OPTION ABLATION FOR MENORRHAGIA  . EYE SURGERY  age 76   right eye for strabismus    Outpatient Medications Prior to Visit  Medication Sig Dispense Refill  . ALPRAZolam (XANAX) 0.5 MG tablet TAKE 1 TO 2 TABLETS BY MOUTH 3 TIMES A DAY AS NEEDED FOR ANXIETY 60 tablet 0  . DULoxetine (CYMBALTA) 60 MG capsule Take 1 capsule (60 mg total) by mouth daily. OFFICE VISIT NEEDED 30 capsule 1  . ibuprofen (ADVIL) 200 MG tablet Take 200 mg by mouth every 6 (six) hours as needed.    . loratadine (CLARITIN) 10 MG tablet Take 10 mg by mouth daily.    Marland Kitchen omeprazole (PRILOSEC) 40 MG capsule Take 1 capsule (40 mg total) by mouth daily. 90 capsule 3  . traMADol (ULTRAM) 50 MG tablet 1-2 tabs  po tid prn menstrual cramps 40 tablet 1  . zolpidem (AMBIEN) 10 MG tablet TAKE 1 TABLET (10 MG TOTAL) BY MOUTH AT BEDTIME AS NEEDED. 30 tablet 1  . sulfamethoxazole-trimethoprim (BACTRIM DS) 800-160 MG tablet 1 tab po bid x 3d (Patient not taking: Reported on 05/14/2019) 6 tablet 0   No facility-administered medications prior to visit.     No Known Allergies  ROS As per HPI  PE: Blood pressure 129/83, pulse 86, temperature 98.3 F (36.8 C), temperature source Temporal, resp. rate 16, height 5\' 3"  (1.6 m), weight 140 lb (63.5 kg), last menstrual period 04/29/2019, SpO2 96 %. Gen: Alert, well appearing.  Patient is oriented to person, place, time, and situation. AFFECT: pleasant, lucid thought and speech. Eyes; mild diffuse injection of bulbar conjunctiva bilat.   Eye lids and lashes all normal.  No exudate. PERRLA, EOMI.  LABS:    Chemistry      Component Value Date/Time   NA 137 09/10/2017 1201   K 3.7 09/10/2017 1201   CL 106 09/10/2017 1201   CO2 23 09/10/2017 1201   BUN 14 09/10/2017 1201   CREATININE 0.76 09/10/2017 1201   CREATININE 0.67 07/06/2017 1548      Component Value Date/Time   CALCIUM 9.2 09/10/2017 1201   ALKPHOS 42 09/10/2017 1201   AST 23 09/10/2017 1201   ALT 23 09/10/2017 1201   BILITOT 0.7 09/10/2017 1201     Lab Results  Component Value Date   HGBA1C 5.4 09/18/2017   Lab Results  Component Value Date   CHOL 192 04/27/2017   HDL 79.30 04/27/2017   LDLCALC 101 (H) 04/27/2017   TRIG 60.0 04/27/2017   CHOLHDL 2 04/27/2017   Lab Results  Component Value Date   WBC 7.1 09/10/2017   HGB 13.8 09/10/2017   HCT 40.0 09/10/2017   MCV 87.5 09/10/2017   PLT 315 09/10/2017   Lab Results  Component Value Date   TSH 0.55 04/27/2017   IMPRESSION AND PLAN:  1) Chronic allergic conjunctivitis: trial of patanol gtts, 1 drop bid each eye. She can compare this to otc zaditor. Continue daily nonsedating antihistamine.  2) GAD, hx of MDD: The current medical regimen is effective;  continue present plan and medications. CSC updated today.  3) Insomnia: doing well on ambien 10mg  qhs. CSC updated.  4) Severe menstrual cramps: uses tramadol in small dose infrequently for this. [place] Updated today.  An After Visit Summary was printed and given to the patient.  FOLLOW UP: Return in about 6 months (around 11/11/2019) for routine chronic illness f/u.  Signed:  Crissie Sickles, MD           05/14/2019

## 2019-06-20 ENCOUNTER — Other Ambulatory Visit: Payer: Self-pay | Admitting: Family Medicine

## 2019-06-21 ENCOUNTER — Other Ambulatory Visit: Payer: Self-pay | Admitting: Physician Assistant

## 2019-07-07 ENCOUNTER — Other Ambulatory Visit: Payer: Self-pay | Admitting: Family Medicine

## 2019-07-08 NOTE — Telephone Encounter (Signed)
RF request for Ambien  LOV: 05/14/2019 Updated contract  Next ov: Not scheduled  Last written: 05/06/2019 #30 x1 refill

## 2019-07-16 ENCOUNTER — Other Ambulatory Visit: Payer: Self-pay | Admitting: Physician Assistant

## 2019-07-21 ENCOUNTER — Other Ambulatory Visit: Payer: Self-pay | Admitting: Family Medicine

## 2019-07-21 NOTE — Telephone Encounter (Signed)
RF request for alprazolam. Last OV 05/14/2019 advised 6 month F/U No upcoming appointments Last RX 05/06/2019 # 60, 0 RF  Okay to fill?

## 2019-10-01 ENCOUNTER — Other Ambulatory Visit: Payer: Self-pay | Admitting: Obstetrics and Gynecology

## 2019-10-01 DIAGNOSIS — Z1231 Encounter for screening mammogram for malignant neoplasm of breast: Secondary | ICD-10-CM

## 2019-11-02 ENCOUNTER — Other Ambulatory Visit: Payer: Self-pay | Admitting: Physician Assistant

## 2019-11-02 ENCOUNTER — Other Ambulatory Visit: Payer: Self-pay | Admitting: Family Medicine

## 2019-11-03 ENCOUNTER — Ambulatory Visit: Payer: Commercial Managed Care - PPO

## 2019-11-03 NOTE — Telephone Encounter (Signed)
Ok. #30 rx'd. Needs office f/u prior to any further RF's.

## 2019-11-03 NOTE — Telephone Encounter (Signed)
Requesting: zolpidem  Contract:03/19/18 UDS:03/19/18 Last Visit:05/14/19 Next Visit:advised to f/u May Last Refill:07/08/19 (30,3)  Please Advise. Medication pending

## 2019-11-03 NOTE — Telephone Encounter (Signed)
MyChart message read.

## 2019-11-12 ENCOUNTER — Encounter: Payer: Self-pay | Admitting: Family Medicine

## 2019-11-12 ENCOUNTER — Ambulatory Visit (INDEPENDENT_AMBULATORY_CARE_PROVIDER_SITE_OTHER): Payer: Commercial Managed Care - PPO | Admitting: Family Medicine

## 2019-11-12 ENCOUNTER — Other Ambulatory Visit: Payer: Self-pay

## 2019-11-12 VITALS — BP 137/85 | HR 83 | Temp 98.2°F | Resp 16 | Ht 63.0 in | Wt 142.6 lb

## 2019-11-12 DIAGNOSIS — R232 Flushing: Secondary | ICD-10-CM | POA: Diagnosis not present

## 2019-11-12 DIAGNOSIS — N951 Menopausal and female climacteric states: Secondary | ICD-10-CM | POA: Diagnosis not present

## 2019-11-12 DIAGNOSIS — Z8659 Personal history of other mental and behavioral disorders: Secondary | ICD-10-CM

## 2019-11-12 DIAGNOSIS — Z79899 Other long term (current) drug therapy: Secondary | ICD-10-CM

## 2019-11-12 DIAGNOSIS — F411 Generalized anxiety disorder: Secondary | ICD-10-CM | POA: Diagnosis not present

## 2019-11-12 DIAGNOSIS — G4700 Insomnia, unspecified: Secondary | ICD-10-CM

## 2019-11-12 MED ORDER — OMEPRAZOLE 40 MG PO CPDR: DELAYED_RELEASE_CAPSULE | ORAL | 3 refills | Status: DC

## 2019-11-12 MED ORDER — LORATADINE 10 MG PO TABS: 10.0000 mg | ORAL_TABLET | Freq: Every day | ORAL | 3 refills | Status: DC

## 2019-11-12 MED ORDER — DULOXETINE HCL 60 MG PO CPEP: 60.0000 mg | ORAL_CAPSULE | Freq: Every day | ORAL | 3 refills | Status: DC

## 2019-11-12 MED ORDER — ALPRAZOLAM 0.5 MG PO TABS: ORAL_TABLET | ORAL | 5 refills | Status: DC

## 2019-11-12 NOTE — Progress Notes (Signed)
See med student note for this encounter. Signed:  Crissie Sickles, MD           11/12/2019

## 2019-11-12 NOTE — Progress Notes (Signed)
CC:  [name] is a 54 year old woman with a PMH of MDD/anxiety, insomnia, IBS, and dyspepsia who presents for a 6 month follow up on routine chronic illnesses.   HPI: A/P of last visit:  "1) Chronic allergic conjunctivitis: trial of patanol gtts, 1 drop bid each eye. She can compare this to otc zaditor. Continue daily nonsedating antihistamine.  2) GAD, hx of MDD: The current medical regimen is effective;  continue present plan and medications. CSC updated today.  3) Insomnia: doing well on ambien 10mg  qhs. CSC updated.  4) Severe menstrual cramps: uses tramadol in small dose infrequently for this. [place] Updated today."  Interim hx:  Menopause Hot flashes lots at night but now also some in daytime, flushes and sweats profusely. ambien helps sleep at night , taking every night  No period in 4 months so she has not used tramadol for her dysmenorrhea during this time.  Prior to last 4 mo her menses had been becoming fewer and further between.  No vag bleeding at all last 4 mo. Thinking about starting HRT  -does not smoke Weight gain (142lb today) - 125lb in September 2019  -"feet issues" so hard to exercise/walk like she wants to ; does yoga/swim ; has been closed so has not been doing it  -1st metatarsal so straight she could not bend it, had surgery on R foot but not on L yet but will probably need it  -has been preventing her from walking / moving as much massages where they crack scar tissue help ; will consider PT   Tolerating meds? Side effects? Headache, sleepiness, fatigue, weight gain?  -yes ; perhaps weight gain related to cymbalta?   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 She wants to talk about hormones ; feels anxious and depressed at times but nothing severe or persistent; taking cymbalta every day, xanax 2-3x/wk (took last night)   PMP AWARE  reviewed today: most recent rx for ambien 10mg  was filled 11/03/19, # 63, rx by me. Most recent alprazolam 0.5mg  rx filled 09/28/19, #60, rx by me. No red flags.  Would like to renew prilosec  Dysmenorrhea  -no period in 4 mo   Allergies  -claritin and zyrtec alternate, eye drops some days - medicines help  -worse in fall than spring  -ok with meds now   Diet/exercise -talked about trying to do more yoga and stretching  -fruits good, trying to eat more veggies good   ROS: no fevers, no CP, no SOB, no wheezing, no cough, no dizziness, no HAs, no rashes, no melena/hematochezia.  No polyuria or polydipsia.  No myalgias or arthralgias.  No focal weakness, paresthesias, or tremors.  No acute vision or hearing abnormalities. No n/v/d or abd pain.  No palpitations.     PMH: Past Medical History:  Diagnosis Date  . Allergy    seasonal  . Depression   . Dysmenorrhea   . Dyspepsia 08/2017   vs gastritis.  MUCH improved with PPI + getting anxiety controlled.  . Fatty liver 08/2017   u/s  . Fibroid uterus 08/2016   U/s at GYN  . GAD (generalized anxiety disorder)   . Gestational diabetes   . History of adenomatous polyp of colon 07/2016   Recall 5 yrs (Dr. Havery Moros)  . HSV infection   . IBS (irritable bowel syndrome)   .  Insomnia   . Menopausal symptoms    GYN started prometrium and vivelle 08/2016.  . Menorrhagia    + dysmenorrhea and hx of fibroids: GYN eval 07/2017->FSH normal, saline infusion u/s showed normal uterus/endomet + R ovarian cystic nodule suspected to be hemorrhagic cyst vs endometrioma.  CA 125 level normal.  Pt offered Mirena or hysterectomy and she declined both.  . Neck pain, musculoskeletal 02/06/2011  . 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 Postprandial bloating 08/2016   ? IBS.  GYN MD started Bentyl trial 08/2016.  . 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 Seasonal allergic rhinitis   . Stress fracture 2008   left ankle    M/A: Current  Outpatient Medications on File Prior to Visit  Medication Sig Dispense Refill  . ALPRAZolam (XANAX) 0.5 MG tablet TAKE 1 TO 2 TABLETS BY MOUTH 3 TIMES A DAY AS NEEDED FOR ANXIETY 60 tablet 3  . DULoxetine (CYMBALTA) 60 MG capsule TAKE 1 CAPSULE (60 MG TOTAL) BY MOUTH DAILY. OFFICE VISIT NEEDED 90 capsule 1  . ibuprofen (ADVIL) 200 MG tablet Take 200 mg by mouth every 6 (six) hours as needed.    . loratadine (CLARITIN) 10 MG tablet Take 10 mg by mouth daily.    Marland Kitchen olopatadine (PATANOL) 0.1 % ophthalmic solution Place 1 drop into both eyes 2 (two) times daily. 5 mL 12  . omeprazole (PRILOSEC) 40 MG capsule TAKE 1 CAPSULE BY MOUTH EVERY DAY 30 capsule 0  . traMADol (ULTRAM) 50 MG tablet 1-2 tabs po tid prn menstrual cramps 40 tablet 1  . zolpidem (AMBIEN) 10 MG tablet TAKE 1 TABLET (10 MG TOTAL) BY MOUTH AT BEDTIME AS NEEDED. 30 tablet 0   No current facility-administered medications on file prior to visit.   No Known Allergies  FH: Family History  Problem Relation Age of Onset  . Diabetes Mother        Type 2  . Hypertension Mother   . Hyperlipidemia Mother   . Depression Mother   . Seasonal affective disorder Father   . Cancer Father        MELANOMA  . Bipolar disorder Brother   . Drug abuse Brother        cocaine, vicodin, pain killers  . ADD / ADHD Son   . Depression Son   . Cancer Maternal Grandmother        gyn possibly cervical or uterine  . Diabetes Maternal Grandfather        Type 2  . Hypertension Maternal Grandfather   . Depression Maternal Grandfather   . Alcohol abuse Paternal Grandfather   . Colon cancer Neg Hx   . Esophageal cancer Neg Hx   . Rectal cancer Neg Hx   . Stomach cancer Neg Hx     SH: Social History   Socioeconomic History  . Marital status: Married    Spouse name: Not on file  . Number of children: Not on file  . Years of education: Not on file  . Highest education level: Not on file  Occupational History  . Not on file  Tobacco Use  .  Smoking status: Never Smoker  . Smokeless tobacco: Never Used  Substance and Sexual Activity  . Alcohol use: Yes    Alcohol/week: 0.0 standard drinks    Comment: occasionaly  . Drug use: No  . Sexual activity: Yes    Partners: Male    Birth control/protection: Other-see comments, Surgical    Comment: husband  vasectomy  Other Topics Concern  . 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   No T/A/Ds.   Social Determinants of Health   Financial Resource Strain:   . Difficulty of Paying Living Expenses:   Food Insecurity:   . Worried About Charity fundraiser in the Last Year:   . Arboriculturist in the Last Year:   Transportation Needs:   . Film/video editor (Medical):   Marland Kitchen Lack of Transportation (Non-Medical):   Physical Activity:   . Days of Exercise per Week:   . Minutes of Exercise per Session:   Stress:   . Feeling of Stress :   Social Connections:   . Frequency of Communication with Friends and Family:   . Frequency of Social Gatherings with Friends and Family:   . Attends Religious Services:   . Active Member of Clubs or Organizations:   . Attends Archivist Meetings:   Marland Kitchen Marital Status:     ROS: Review of Systems  Constitutional: Negative for fever and weight loss.  HENT: Negative.   Eyes: Negative.   Respiratory: Negative.  Negative for shortness of breath.   Cardiovascular: Negative.  Negative for chest pain, palpitations and leg swelling.  Gastrointestinal: Positive for heartburn. Negative for abdominal pain, blood in stool, constipation, diarrhea, melena, nausea and vomiting.  Genitourinary: Negative.   Musculoskeletal: Negative.   Skin: Negative.   Neurological: Negative.   Endo/Heme/Allergies: Negative.   Psychiatric/Behavioral: Positive for depression. The patient is nervous/anxious.     PE: Vitals with BMI 05/14/2019 10/23/2018 03/19/2018  Height 5\' 3"  - 5' 3.5"  Weight 140  lbs 135 lbs 125 lbs 2 oz  BMI 123456 - Q000111Q  Systolic Q000111Q - [id number]  Diastolic 83 - 78  Pulse 86 - 84    Physical Exam  Constitutional: She is oriented to person, place, and time and well-developed, well-nourished, and in no distress. No distress.  HENT:  Head: Normocephalic and atraumatic.  Cardiovascular: Normal rate, regular rhythm and intact distal pulses. Exam reveals no gallop and no friction rub.  No murmur heard. Pulmonary/Chest: Effort normal and breath sounds normal. No respiratory distress. She has no wheezes. She has no rales. She exhibits no tenderness.  Musculoskeletal:        General: No edema.  Neurological: She is alert and oriented to person, place, and time.  Skin: Skin is warm and dry. No rash noted. She is not diaphoretic. No erythema. No pallor.  Psychiatric: Mood, memory, affect and judgment normal.    Labs: No results found for this or any previous visit (from the past 2160 hour(s)).   A/P: In summary, Karen Oconnell is a 54 year old woman with a past medical history of depression/anxiety, insomnia, and IBS who presents for a routine 6 month follow up. On physical exam, she is unremarkable. We reviewed age and gender appropriate health maintenance issues (prudent diet, regular exercise, health risks of tobacco and excessive alcohol, use of seatbelts, fire alarms in home, use of sunscreen). Also reviewed age and gender appropriate health screening as well as vaccine recommendations.  My plan is : Menopause sx of emotional lability and hot flashes: consider lifestyle changes (exercise and diet - trying to exercise more and eat healthier) to see of sx improve. Also she will discuss with her OBGYN to consider using hormone replacement therapy. Anxiety/depression: continue cymbalta, has been stable long term on this med.  Continue  prn alprazolam--uses sparingly. Insomnia: continue using ambien as indicated.  CSC is UTD. Dysmenorrhea: continue taking tramadol as needed (has not  needed): no new rx was given today.  Allergies: continue meds as indicated Dyspepsia: stable.  Renew prilosec prescription. Labs: CBC, CMP, TSH, FLP -->ordered future when fasting. Vaccines: Tdap UTD (due in 2026). COVID UTD.  Colon ca screening: hx of polyps, recall 2023. Breast cancer screening: mammogram is scheduled   Cervical cancer screening: due for pap smear - will follow up with GYN soon as per pt's reported plan today.     Follow Up:  6 months    Signed: Bennie Dallas, MS3 12 Nov 2019   I personally was present during the history, physical exam, and medical decision-making activities of this service and have verified that the service and findings are accurately documented in the student's note. Signed:  Crissie Sickles, MD           11/12/2019

## 2019-12-04 ENCOUNTER — Other Ambulatory Visit: Payer: Self-pay

## 2019-12-04 MED ORDER — ZOLPIDEM TARTRATE 10 MG PO TABS: 10.0000 mg | ORAL_TABLET | Freq: Every evening | ORAL | 5 refills | Status: DC | PRN

## 2019-12-04 NOTE — Telephone Encounter (Signed)
Requesting: zolpidem Contract:05/14/19 UDS:03/19/18 Last Visit:11/12/19 Next Visit:advised to f/u 44mo. Last Refill:11/03/19(30,0)  Please Advise. Medication pending

## 2019-12-04 NOTE — Telephone Encounter (Signed)
Patient called in wanting to switch pharmacy    zolpidem (AMBIEN) 10 MG tablet   [id number] - [place], [place] - [address]

## 2019-12-05 NOTE — Telephone Encounter (Signed)
MyChart message read.

## 2019-12-15 ENCOUNTER — Other Ambulatory Visit: Payer: Self-pay

## 2019-12-15 ENCOUNTER — Ambulatory Visit (INDEPENDENT_AMBULATORY_CARE_PROVIDER_SITE_OTHER): Payer: Commercial Managed Care - PPO | Admitting: Family Medicine

## 2019-12-15 DIAGNOSIS — R232 Flushing: Secondary | ICD-10-CM | POA: Diagnosis not present

## 2019-12-15 DIAGNOSIS — N951 Menopausal and female climacteric states: Secondary | ICD-10-CM

## 2019-12-15 LAB — LIPID PANEL
Cholesterol: 224 mg/dL — ABNORMAL HIGH (ref 0–200)
HDL: 80.9 mg/dL (ref >39.00)
LDL Cholesterol: 116 mg/dL — ABNORMAL HIGH (ref 0–99)
NonHDL: 143.21
Total CHOL/HDL Ratio: 3
Triglycerides: 138 mg/dL (ref 0.0–149.0)
VLDL: 27.6 mg/dL (ref 0.0–40.0)

## 2019-12-15 LAB — CBC WITH DIFFERENTIAL/PLATELET
Basophils Absolute: 0.1 10*3/uL (ref 0.0–0.1)
Basophils Relative: 0.9 % (ref 0.0–3.0)
Eosinophils Absolute: 0.1 10*3/uL (ref 0.0–0.7)
Eosinophils Relative: 1.5 % (ref 0.0–5.0)
HCT: 39.2 % (ref 36.0–46.0)
Hemoglobin: 13.5 g/dL (ref 12.0–15.0)
Lymphocytes Relative: 33.7 % (ref 12.0–46.0)
Lymphs Abs: 1.9 10*3/uL (ref 0.7–4.0)
MCHC: 34.4 g/dL (ref 30.0–36.0)
MCV: 89.8 fl (ref 78.0–100.0)
Monocytes Absolute: 0.4 10*3/uL (ref 0.1–1.0)
Monocytes Relative: 6.6 % (ref 3.0–12.0)
Neutro Abs: 3.2 10*3/uL (ref 1.4–7.7)
Neutrophils Relative %: 57.3 % (ref 43.0–77.0)
Platelets: 349 10*3/uL (ref 150.0–400.0)
RBC: 4.37 Mil/uL (ref 3.87–5.11)
RDW: 14.6 % (ref 11.5–15.5)
WBC: 5.7 10*3/uL (ref 4.0–10.5)

## 2019-12-15 LAB — COMPREHENSIVE METABOLIC PANEL
ALT: 15 U/L (ref 0–35)
AST: 17 U/L (ref 0–37)
Albumin: 4.4 g/dL (ref 3.5–5.2)
Alkaline Phosphatase: 51 U/L (ref 39–117)
BUN: 11 mg/dL (ref 6–23)
CO2: 27 mEq/L (ref 19–32)
Calcium: 9.4 mg/dL (ref 8.4–10.5)
Chloride: 102 mEq/L (ref 96–112)
Creatinine, Ser: 0.71 mg/dL (ref 0.40–1.20)
GFR: 85.75 mL/min (ref >60.00)
Glucose, Bld: 123 mg/dL — ABNORMAL HIGH (ref 70–99)
Potassium: 4.6 mEq/L (ref 3.5–5.1)
Sodium: 137 mEq/L (ref 135–145)
Total Bilirubin: 0.4 mg/dL (ref 0.2–1.2)
Total Protein: 6.5 g/dL (ref 6.0–8.3)

## 2019-12-15 LAB — TSH: TSH: 0.91 u[IU]/mL (ref 0.35–4.50)

## 2019-12-17 ENCOUNTER — Ambulatory Visit
Admission: RE | Admit: 2019-12-17 | Discharge: 2019-12-17 | Disposition: A | Discharge status: Home / Self Care | Payer: Commercial Managed Care - PPO | Source: Ambulatory Visit | Attending: Obstetrics and Gynecology | Admitting: Obstetrics and Gynecology

## 2019-12-17 ENCOUNTER — Other Ambulatory Visit: Payer: Self-pay

## 2019-12-17 ENCOUNTER — Other Ambulatory Visit (INDEPENDENT_AMBULATORY_CARE_PROVIDER_SITE_OTHER): Payer: Commercial Managed Care - PPO

## 2019-12-17 DIAGNOSIS — Z1231 Encounter for screening mammogram for malignant neoplasm of breast: Secondary | ICD-10-CM

## 2019-12-17 DIAGNOSIS — R7301 Impaired fasting glucose: Secondary | ICD-10-CM

## 2019-12-17 LAB — HM PAP SMEAR

## 2019-12-17 LAB — HEMOGLOBIN A1C: Hgb A1c MFr Bld: 6.1 % (ref 4.6–6.5)

## 2019-12-17 LAB — RESULTS CONSOLE HPV: CHL HPV: NEGATIVE

## 2019-12-17 LAB — HM MAMMOGRAPHY

## 2019-12-29 DIAGNOSIS — F419 Anxiety disorder, unspecified: Secondary | ICD-10-CM | POA: Insufficient documentation

## 2019-12-29 DIAGNOSIS — R7303 Prediabetes: Secondary | ICD-10-CM | POA: Insufficient documentation

## 2020-02-20 ENCOUNTER — Telehealth: Payer: Self-pay

## 2020-02-20 NOTE — Telephone Encounter (Signed)
Patient was last seen 11/12/19, RCI and labs completed 12/15/19. Placed on PCP desk to review and sign, if appropriate.

## 2020-02-20 NOTE — Telephone Encounter (Signed)
Dropped off form for work/insurance company. Is requesting lab results and Dr. Idelle Leech signature. Patient is aware he is out of office until next week.  Please call patient at [phone] when ready for pick up or if there any questions.  Form given to [place]. 8/20 dnh

## 2020-02-24 NOTE — Telephone Encounter (Signed)
Signed and put on your desk

## 2020-02-25 NOTE — Telephone Encounter (Signed)
MyChart message read.

## 2020-05-17 ENCOUNTER — Ambulatory Visit (INDEPENDENT_AMBULATORY_CARE_PROVIDER_SITE_OTHER): Payer: Commercial Managed Care - PPO | Admitting: Podiatry

## 2020-05-17 ENCOUNTER — Other Ambulatory Visit: Payer: Self-pay

## 2020-05-17 ENCOUNTER — Ambulatory Visit (INDEPENDENT_AMBULATORY_CARE_PROVIDER_SITE_OTHER): Payer: Commercial Managed Care - PPO

## 2020-05-17 DIAGNOSIS — M205X1 Other deformities of toe(s) (acquired), right foot: Secondary | ICD-10-CM | POA: Diagnosis not present

## 2020-05-17 DIAGNOSIS — M205X2 Other deformities of toe(s) (acquired), left foot: Secondary | ICD-10-CM

## 2020-05-17 NOTE — Progress Notes (Signed)
HPI: 54 y.o. female presenting today as a new patient referral from Dr. Williemae Natter, local podiatrist, for evaluation of pain to the bilateral great toe joints.  Left greater than right.  Patient states that she has had significant increasing pain and tenderness to the left great toe joint.  She does have a history of surgery to the right foot.  She denies injury and she has tried multiple conservative modalities including steroidal and oral anti-inflammatories and injections.  She is also worn a cam boot in the past.  She presents for further treatment evaluation  Past Medical History:  Diagnosis Date  . Allergy    seasonal  . Depression   . Dysmenorrhea   . Dyspepsia 08/2017   vs gastritis.  MUCH improved with PPI + getting anxiety controlled.  . Fatty liver 08/2017   u/s  . Fibroid uterus 08/2016   U/s at GYN  . GAD (generalized anxiety disorder)   . Gestational diabetes   . History of adenomatous polyp of colon 07/2016   Recall 5 yrs (Dr. Havery Moros)  . HSV infection   . IBS (irritable bowel syndrome)   . Insomnia   . Menopausal symptoms    GYN started prometrium and vivelle 08/2016.  . Menorrhagia    + dysmenorrhea and hx of fibroids: GYN eval 07/2017->FSH normal, saline infusion u/s showed normal uterus/endomet + R ovarian cystic nodule suspected to be hemorrhagic cyst vs endometrioma.  CA 125 level normal.  Pt offered Mirena or hysterectomy and she declined both.  . Neck pain, musculoskeletal 02/06/2011  . 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 Postprandial bloating 08/2016   ? IBS.  GYN MD started Bentyl trial 08/2016.  . 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 Seasonal allergic rhinitis   . Stress fracture 2008   left ankle     Physical Exam: General: The patient is alert and oriented x3 in no acute distress.  Dermatology: Skin is warm, dry and supple bilateral lower extremities. Negative for open lesions or macerations.  Vascular: Palpable pedal pulses  bilaterally. No edema or erythema noted. Capillary refill within normal limits.  Neurological: Epicritic and protective threshold grossly intact bilaterally.   Musculoskeletal Exam: Range of motion within normal limits to all pedal and ankle joints bilateral. Muscle strength 5/5 in all groups bilateral.  Pain on palpation and range of motion with crepitus noted to the bilateral great toe joints  Radiographic Exam:  Normal osseous mineralization.  Joint space narrowing with periarticular spurring noted to the bilateral great toe joints.  K wire fixation noted in the distal metatarsal right foot appears to be intact. No fracture/dislocation/boney destruction.    Assessment: 1.  Hallux limitus bilateral 2.  History of bunionectomy surgery right   Plan of Care:  1. Patient evaluated. X-Rays reviewed.  2. Today we discussed the conservative versus surgical management of the presenting pathology. The patient opts for surgical management. All possible complications and details of the procedure were explained. All patient questions were answered. No guarantees were expressed or implied. 3. Authorization for surgery was initiated today. Surgery will consist of left great toe arthroplasty with implant.  4.  Cam boot dispensed. 5.  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         Edrick Kins, DPM [facility]  Dr. Edrick Kins, DPM    [address]  [place], [place] [address]                Office [phone]  Fax [phone]

## 2020-05-30 ENCOUNTER — Other Ambulatory Visit: Payer: Self-pay | Admitting: Family Medicine

## 2020-05-31 NOTE — Telephone Encounter (Signed)
Requesting:  Zolpidem Contract: 05/14/19 UDS:03/19/18 Last Visit: 11/12/19 Next Visit: advised to f/u 6 months  Last Refill: 12/04/19 (30,5)  Please Advise. Medication pending. Mychart message sent advising patient she is due for f/u appt.

## 2020-06-01 NOTE — Telephone Encounter (Signed)
Patient has picked up medication 

## 2020-06-02 DIAGNOSIS — E042 Nontoxic multinodular goiter: Secondary | ICD-10-CM

## 2020-06-02 HISTORY — DX: Nontoxic multinodular goiter: E04.2

## 2020-06-08 ENCOUNTER — Telehealth: Payer: Self-pay | Admitting: Podiatry

## 2020-06-08 NOTE — Telephone Encounter (Signed)
DOS: 06/24/2020  Procedure: Vilinda Blanks Implant Lt [phone])  [facility] Effective From 02/10/2019 -  Deductible: $3,000 with $3,000 met and $0 remaining. Out of Pocket: $3,000 with $3,000 met and $0 remaining. CoInsurance: 100% Copay: $0  Per Charmaine Downs no Prior Authorization is required. Call Reference Number [id number].

## 2020-06-17 ENCOUNTER — Other Ambulatory Visit: Payer: Self-pay | Admitting: Family Medicine

## 2020-06-18 NOTE — Telephone Encounter (Signed)
Spoke with patient and she has enough medication to last until appt on 12/21. She just wanted to be proactive and request refill.

## 2020-06-22 ENCOUNTER — Other Ambulatory Visit: Payer: Self-pay

## 2020-06-22 ENCOUNTER — Encounter: Payer: Self-pay | Admitting: Family Medicine

## 2020-06-22 ENCOUNTER — Ambulatory Visit (INDEPENDENT_AMBULATORY_CARE_PROVIDER_SITE_OTHER): Payer: Commercial Managed Care - PPO | Admitting: Family Medicine

## 2020-06-22 VITALS — BP 132/84 | HR 105 | Temp 97.5°F | Resp 16 | Ht 63.0 in | Wt 143.6 lb

## 2020-06-22 DIAGNOSIS — F5101 Primary insomnia: Secondary | ICD-10-CM

## 2020-06-22 DIAGNOSIS — R000 Tachycardia, unspecified: Secondary | ICD-10-CM | POA: Diagnosis not present

## 2020-06-22 DIAGNOSIS — E010 Iodine-deficiency related diffuse (endemic) goiter: Secondary | ICD-10-CM | POA: Diagnosis not present

## 2020-06-22 DIAGNOSIS — R7303 Prediabetes: Secondary | ICD-10-CM

## 2020-06-22 DIAGNOSIS — R0989 Other specified symptoms and signs involving the circulatory and respiratory systems: Secondary | ICD-10-CM

## 2020-06-22 DIAGNOSIS — R198 Other specified symptoms and signs involving the digestive system and abdomen: Secondary | ICD-10-CM | POA: Diagnosis not present

## 2020-06-22 DIAGNOSIS — F419 Anxiety disorder, unspecified: Secondary | ICD-10-CM

## 2020-06-22 DIAGNOSIS — Z79899 Other long term (current) drug therapy: Secondary | ICD-10-CM

## 2020-06-22 DIAGNOSIS — F32A Depression, unspecified: Secondary | ICD-10-CM

## 2020-06-22 LAB — C-REACTIVE PROTEIN: CRP: <1 mg/dL (ref 0.5–20.0)

## 2020-06-22 LAB — BASIC METABOLIC PANEL
BUN: 15 mg/dL (ref 6–23)
CO2: 27 mEq/L (ref 19–32)
Calcium: 9.8 mg/dL (ref 8.4–10.5)
Chloride: 102 mEq/L (ref 96–112)
Creatinine, Ser: 0.73 mg/dL (ref 0.40–1.20)
GFR: 93.1 mL/min (ref >60.00)
Glucose, Bld: 132 mg/dL — ABNORMAL HIGH (ref 70–99)
Potassium: 4 mEq/L (ref 3.5–5.1)
Sodium: 137 mEq/L (ref 135–145)

## 2020-06-22 LAB — CBC WITH DIFFERENTIAL/PLATELET
Basophils Absolute: 0 10*3/uL (ref 0.0–0.1)
Basophils Relative: 0.4 % (ref 0.0–3.0)
Eosinophils Absolute: 0 10*3/uL (ref 0.0–0.7)
Eosinophils Relative: 0.6 % (ref 0.0–5.0)
HCT: 42.4 % (ref 36.0–46.0)
Hemoglobin: 14.1 g/dL (ref 12.0–15.0)
Lymphocytes Relative: 22.9 % (ref 12.0–46.0)
Lymphs Abs: 1.7 10*3/uL (ref 0.7–4.0)
MCHC: 33.2 g/dL (ref 30.0–36.0)
MCV: 88.4 fl (ref 78.0–100.0)
Monocytes Absolute: 0.6 10*3/uL (ref 0.1–1.0)
Monocytes Relative: 8.2 % (ref 3.0–12.0)
Neutro Abs: 4.9 10*3/uL (ref 1.4–7.7)
Neutrophils Relative %: 67.9 % (ref 43.0–77.0)
Platelets: 335 10*3/uL (ref 150.0–400.0)
RBC: 4.8 Mil/uL (ref 3.87–5.11)
RDW: 15.3 % (ref 11.5–15.5)
WBC: 7.3 10*3/uL (ref 4.0–10.5)

## 2020-06-22 LAB — T4, FREE: Free T4: 0.84 ng/dL (ref 0.60–1.60)

## 2020-06-22 LAB — SEDIMENTATION RATE: Sed Rate: 10 mm/hr (ref 0–30)

## 2020-06-22 LAB — HEMOGLOBIN A1C: Hgb A1c MFr Bld: 6.6 % — ABNORMAL HIGH (ref 4.6–6.5)

## 2020-06-22 LAB — TSH: TSH: 0.73 u[IU]/mL (ref 0.35–4.50)

## 2020-06-22 MED ORDER — ALPRAZOLAM 0.5 MG PO TABS: ORAL_TABLET | ORAL | 5 refills | Status: DC

## 2020-06-22 MED ORDER — ZOLPIDEM TARTRATE 10 MG PO TABS: 10.0000 mg | ORAL_TABLET | Freq: Every evening | ORAL | 5 refills | Status: DC | PRN

## 2020-06-22 NOTE — Progress Notes (Signed)
OFFICE VISIT  06/22/2020  CC:  Chief Complaint  Patient presents with  . Follow-up    Insomnia, anxiety/depression. Pt is not fasting   HPI:    Patient is a 54 y.o. Caucasian female who presents for 6 mo f/u anx/dep and insomnia. Last visit all was stable and all meds continued w/out any change. Labs showed all normal except fasting glucose 123, Hba1c 6.1%-->TLC rec's given.  INTERIM HX: Feeling good, esp since getting on estrogen, progesterone, and testosterone since I last saw her.  Sleep good but must take ambien nightly. Mood and anxiety level improved since getting on hormone.  Improved diet since I last saw her, lost 10 lbs but has gained 8 lbs back. Getting left foot 1st MTP replaced next week and then hopefully will be able to exercise.  Has noted some swelling in area of thyroid gland last few days, a coworker pointed it out. She does feel globus sensation/neck fullness anteriorly. No recent fevers, URI sx's, fatigue, tremulousness, or signif inc in anxiety. No palpitations.   PMP AWARE reviewed today: most recent rx for ambien 13m was filled 05/31/20, # 30, rx by me. Most recent rx for alpraz 0.544mwas filled 04/17/20, #60, rx by me. No red flags.  Past Medical History:  Diagnosis Date  . Allergy    seasonal  . Depression   . Dysmenorrhea   . Dyspepsia 08/2017   vs gastritis.  MUCH improved with PPI + getting anxiety controlled.  . Fatty liver 08/2017   u/s  . Fibroid uterus 08/2016   U/s at GYN  . GAD (generalized anxiety disorder)   . Gestational diabetes   . History of adenomatous polyp of colon 07/2016   Recall 5 yrs (Dr. ArHavery Moros . HSV infection   . IBS (irritable bowel syndrome)   . Insomnia   . Menopausal symptoms    GYN started prometrium and vivelle 08/2016.  . Menorrhagia    + dysmenorrhea and hx of fibroids: GYN eval 07/2017->FSH normal, saline infusion u/s showed normal uterus/endomet + R ovarian cystic nodule suspected to be  hemorrhagic cyst vs endometrioma.  CA 125 level normal.  Pt offered Mirena or hysterectomy and she declined both.  . Neck pain, musculoskeletal 02/06/2011  . 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 Postprandial bloating 08/2016   ? IBS.  GYN MD started Bentyl trial 08/2016.  . 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 Seasonal allergic rhinitis   . Stress fracture 2008   left ankle    Past Surgical History:  Procedure Laterality Date  . BUNIONECTOMY  08/2013   right  . CESAREAN SECTION  1997   X 1  . COLONOSCOPY W/ POLYPECTOMY  07/12/2016   Tubular adenoma: recall 07/2021.  +Diverticulosis L colon.  . cyst removed from back of right leg  6-12  . ENDOMETRIAL ABLATION  2010   HER OPTION ABLATION FOR MENORRHAGIA  . EYE SURGERY  age [age] right eye for strabismus    Outpatient Medications Prior to Visit  Medication Sig Dispense Refill  . DULoxetine (CYMBALTA) 60 MG capsule Take 1 capsule (60 mg total) by mouth daily. 90 capsule 3  . ESTROGEL 0.75 MG/1.25 GM (0.06%) topical gel SMARTSIG:Topical    . omeprazole (PRILOSEC) 40 MG capsule TAKE 1 CAPSULE BY MOUTH EVERY DAY 90 capsule 3  . progesterone (PROMETRIUM) 100 MG capsule Take 100 mg by mouth at bedtime.    . ALPRAZolam (XANAX) 0.5 MG tablet 1 tab po  tid prn severe anxiety 60 tablet 5  . zolpidem (AMBIEN) 10 MG tablet TAKE 1 TABLET (10 MG TOTAL) BY MOUTH AT BEDTIME AS NEEDED. 30 tablet 0  . ibuprofen (ADVIL) 200 MG tablet Take 200 mg by mouth every 6 (six) hours as needed. (Patient not taking: Reported on 06/22/2020)    . loratadine (CLARITIN) 10 MG tablet Take 1 tablet (10 mg total) by mouth daily. (Patient not taking: Reported on 06/22/2020) 90 tablet 3  . olopatadine (PATANOL) 0.1 % ophthalmic solution Place 1 drop into both eyes 2 (two) times daily. (Patient not taking: Reported on 06/22/2020) 5 mL 12  . traMADol (ULTRAM) 50 MG tablet 1-2 tabs po tid prn menstrual cramps (Patient not taking: No sig reported) 40 tablet 1   No  facility-administered medications prior to visit.    No Known Allergies  ROS As per HPI  PE: Vitals with BMI 06/22/2020 11/12/2019 05/14/2019  Height 5' 3"  5' 3"  5' 3"   Weight 143 lbs 10 oz 142 lbs 10 oz 140 lbs  BMI 25.44 78.24 23.53  Systolic 614 431 540  Diastolic 84 85 83  Pulse 086 83 86     Gen: Alert, well appearing.  Patient is oriented to person, place, time, and situation. AFFECT: pleasant, lucid thought and speech. NECK: mild diffuse swelling in thyroid gland region, mildly uncomfortable to palpation, soft and w/out any palpable nodule.  No neck adenopathy or mass or erythema/skin changes. CV: Regular rhythm, tachy to 100-105 range, no m/r/g.   LUNGS: CTA bilat, nonlabored resps, good aeration in all lung fields. No tremulousness. No LE edema.  LABS:  Lab Results  Component Value Date   TSH 0.91 12/15/2019   Lab Results  Component Value Date   WBC 5.7 12/15/2019   HGB 13.5 12/15/2019   HCT 39.2 12/15/2019   MCV 89.8 12/15/2019   PLT 349.0 12/15/2019   Lab Results  Component Value Date   CREATININE 0.71 12/15/2019   BUN 11 12/15/2019   NA 137 12/15/2019   K 4.6 12/15/2019   CL 102 12/15/2019   CO2 27 12/15/2019   Lab Results  Component Value Date   ALT 15 12/15/2019   AST 17 12/15/2019   ALKPHOS 51 12/15/2019   BILITOT 0.4 12/15/2019   Lab Results  Component Value Date   CHOL 224 (H) 12/15/2019   Lab Results  Component Value Date   HDL 80.90 12/15/2019   Lab Results  Component Value Date   LDLCALC 116 (H) 12/15/2019   Lab Results  Component Value Date   TRIG 138.0 12/15/2019   Lab Results  Component Value Date   CHOLHDL 3 12/15/2019   Lab Results  Component Value Date   HGBA1C 6.1 12/17/2019   IMPRESSION AND PLAN:  1) Anx/dep: stable.  Continue cymbalta 56m qd and alpraz 0.5629mtid prn. Updated CSC and did UDS today. RF'd alpraz 0.29m12m1 tid prn, #60, RF x 5.  2) insomnia: stable. Cont ambien 42m70ms, rx'd #30 with RF x 5  today.  3) Prediabetes: has made some TLC's but hopes to improved on exercise pretty soon after she gets toe surgery. Fasting glucose and Hba1c today.  4) Thyromegaly: has mild (asymptomatic) tachycardia and has a feeling of globus sensation in conjunction with recently noting thyroid enlargement. Just noted about 3 d/a. Question hashimoto thyroiditis.   Check TSH, T4, T3, ESR, CRP, TPO ab's, and CBC w/diff today. Considering thyroid u/s and radioactive iodine uptake and scan at this time,  nothing ordered yet. Will wait on thyroid lab work first.  An After Visit Summary was printed and given to the patient.  FOLLOW UP: Return in about 4 weeks (around 07/20/2020) for f/u thyroid.  Signed:  Crissie Sickles, MD           06/22/2020

## 2020-06-24 ENCOUNTER — Other Ambulatory Visit: Payer: Self-pay | Admitting: Family Medicine

## 2020-06-24 ENCOUNTER — Other Ambulatory Visit: Payer: Self-pay | Admitting: Podiatry

## 2020-06-24 DIAGNOSIS — M205X2 Other deformities of toe(s) (acquired), left foot: Secondary | ICD-10-CM | POA: Diagnosis not present

## 2020-06-24 DIAGNOSIS — E010 Iodine-deficiency related diffuse (endemic) goiter: Secondary | ICD-10-CM

## 2020-06-24 DIAGNOSIS — E119 Type 2 diabetes mellitus without complications: Secondary | ICD-10-CM

## 2020-06-24 MED ORDER — OXYCODONE-ACETAMINOPHEN 5-325 MG PO TABS: 1.0000 | ORAL_TABLET | ORAL | 0 refills | Status: DC | PRN

## 2020-06-24 MED ORDER — IBUPROFEN 800 MG PO TABS: 800.0000 mg | ORAL_TABLET | Freq: Three times a day (TID) | ORAL | 1 refills | Status: DC

## 2020-06-24 NOTE — Progress Notes (Signed)
PRN postop pain 

## 2020-06-25 LAB — DRUG MONITORING, PANEL 8 WITH CONFIRMATION, URINE
6 Acetylmorphine: NEGATIVE ng/mL (ref <10)
Alcohol Metabolites: NEGATIVE ng/mL
Alphahydroxyalprazolam: 30 ng/mL — ABNORMAL HIGH (ref <25)
Alphahydroxymidazolam: NEGATIVE ng/mL (ref <50)
Alphahydroxytriazolam: NEGATIVE ng/mL (ref <50)
Aminoclonazepam: NEGATIVE ng/mL (ref <25)
Amphetamines: NEGATIVE ng/mL (ref <500)
Benzodiazepines: POSITIVE ng/mL — AB (ref <100)
Buprenorphine, Urine: NEGATIVE ng/mL (ref <5)
Cocaine Metabolite: NEGATIVE ng/mL (ref <150)
Creatinine: 70.5 mg/dL
Hydroxyethylflurazepam: NEGATIVE ng/mL (ref <50)
Lorazepam: NEGATIVE ng/mL (ref <50)
MDMA: NEGATIVE ng/mL (ref <500)
Marijuana Metabolite: NEGATIVE ng/mL (ref <20)
Nordiazepam: NEGATIVE ng/mL (ref <50)
Opiates: NEGATIVE ng/mL (ref <100)
Oxazepam: NEGATIVE ng/mL (ref <50)
Oxidant: NEGATIVE ug/mL
Oxycodone: NEGATIVE ng/mL (ref <100)
Temazepam: NEGATIVE ng/mL (ref <50)
pH: 5.3 (ref 4.5–9.0)

## 2020-06-25 LAB — THYROID PEROXIDASE ANTIBODIES (TPO) (REFL): Thyroperoxidase Ab SerPl-aCnc: 1 IU/mL (ref <9)

## 2020-06-25 LAB — T3: T3, Total: 84 ng/dL (ref 76–181)

## 2020-06-25 LAB — DM TEMPLATE

## 2020-06-29 ENCOUNTER — Ambulatory Visit (HOSPITAL_BASED_OUTPATIENT_CLINIC_OR_DEPARTMENT_OTHER)
Admission: RE | Admit: 2020-06-29 | Discharge: 2020-06-29 | Disposition: A | Discharge status: Home / Self Care | Payer: Commercial Managed Care - PPO | Source: Ambulatory Visit | Attending: Family Medicine | Admitting: Family Medicine

## 2020-06-29 ENCOUNTER — Other Ambulatory Visit: Payer: Self-pay

## 2020-06-29 DIAGNOSIS — E010 Iodine-deficiency related diffuse (endemic) goiter: Secondary | ICD-10-CM | POA: Diagnosis present

## 2020-06-30 ENCOUNTER — Ambulatory Visit (INDEPENDENT_AMBULATORY_CARE_PROVIDER_SITE_OTHER): Payer: Commercial Managed Care - PPO | Admitting: Podiatry

## 2020-06-30 ENCOUNTER — Ambulatory Visit (INDEPENDENT_AMBULATORY_CARE_PROVIDER_SITE_OTHER): Payer: Commercial Managed Care - PPO

## 2020-06-30 ENCOUNTER — Encounter: Payer: Self-pay | Admitting: Family Medicine

## 2020-06-30 DIAGNOSIS — M205X1 Other deformities of toe(s) (acquired), right foot: Secondary | ICD-10-CM

## 2020-06-30 DIAGNOSIS — M205X2 Other deformities of toe(s) (acquired), left foot: Secondary | ICD-10-CM | POA: Diagnosis not present

## 2020-06-30 DIAGNOSIS — Z9889 Other specified postprocedural states: Secondary | ICD-10-CM

## 2020-06-30 DIAGNOSIS — M79672 Pain in left foot: Secondary | ICD-10-CM

## 2020-06-30 NOTE — Progress Notes (Signed)
   Subjective:  Patient presents today status post left great toe arthroplasty with implant. DOS: 06/24/2020.  Patient states she has minimal pain.  She is doing very well and she has been weightbearing in the cam boot as instructed.  No new complaints at this time.  Past Medical History:  Diagnosis Date  . Allergy    seasonal  . Depression   . Dysmenorrhea   . Dyspepsia 08/2017   vs gastritis.  MUCH improved with PPI + getting anxiety controlled.  . Fatty liver 08/2017   u/s  . Fibroid uterus 08/2016   U/s at GYN  . GAD (generalized anxiety disorder)   . Gestational diabetes   . History of adenomatous polyp of colon 07/2016   Recall 5 yrs (Dr. Adela Lank)  . HSV infection   . IBS (irritable bowel syndrome)   . Insomnia   . Menopausal symptoms    GYN started prometrium and vivelle 08/2016.  . Menorrhagia    + dysmenorrhea and hx of fibroids: GYN eval 07/2017->FSH normal, saline infusion u/s showed normal uterus/endomet + R ovarian cystic nodule suspected to be hemorrhagic cyst vs endometrioma.  CA 125 level normal.  Pt offered Mirena or hysterectomy and she declined both.  . Multinodular thyroid 06/2020   one cystic/nodule lesion requiring 1 yr f/u ultrasound.  Euthyroid as of 06/2020.  Marland Kitchen Neck pain, musculoskeletal 02/06/2011  . 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 Postprandial bloating 08/2016   ? IBS.  GYN MD started Bentyl trial 08/2016.  . 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 Seasonal allergic rhinitis   . Stress fracture 2008   left ankle      Objective/Physical Exam Neurovascular status intact.  Skin incisions appear to be well coapted with sutures intact. No sign of infectious process noted. No dehiscence. No active bleeding noted. Moderate edema noted to the surgical extremity.  Radiographic Exam:  Orthopedic hardware and osteotomies sites appear to be stable with routine healing.  Assessment: 1. s/p left great toe arthroplasty with implant. DOS:  06/24/2020   Plan of Care:  1. Patient was evaluated. X-rays reviewed 2.  Dressings changed today. 3.  Patient may discontinue cam boot. 4.  Postsurgical shoe dispensed.  Weightbearing as tolerated 5.  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    Felecia Shelling, DPM [facility]  Dr. Felecia Shelling, DPM    [address] [place], [place] [address]                Office [phone]  Fax [phone]

## 2020-07-01 ENCOUNTER — Other Ambulatory Visit: Payer: Self-pay | Admitting: Family Medicine

## 2020-07-01 DIAGNOSIS — L659 Nonscarring hair loss, unspecified: Secondary | ICD-10-CM

## 2020-07-01 DIAGNOSIS — E042 Nontoxic multinodular goiter: Secondary | ICD-10-CM

## 2020-07-03 DIAGNOSIS — R7303 Prediabetes: Secondary | ICD-10-CM

## 2020-07-03 HISTORY — DX: Prediabetes: R73.03

## 2020-07-07 ENCOUNTER — Other Ambulatory Visit: Payer: Self-pay

## 2020-07-07 ENCOUNTER — Ambulatory Visit (INDEPENDENT_AMBULATORY_CARE_PROVIDER_SITE_OTHER): Payer: Commercial Managed Care - PPO | Admitting: Podiatry

## 2020-07-07 DIAGNOSIS — Z9889 Other specified postprocedural states: Secondary | ICD-10-CM

## 2020-07-07 NOTE — Progress Notes (Signed)
   Subjective:  Patient presents today status post left great toe arthroplasty with implant. DOS: 06/24/2020.  Patient has been doing very well.  Minimal pain.  She has been weightbearing in the postsurgical shoe.  No new complaints at this time  Past Medical History:  Diagnosis Date  . Allergy    seasonal  . Depression   . Dysmenorrhea   . Dyspepsia 08/2017   vs gastritis.  MUCH improved with PPI + getting anxiety controlled.  . Fatty liver 08/2017   u/s  . Fibroid uterus 08/2016   U/s at GYN  . GAD (generalized anxiety disorder)   . Gestational diabetes   . History of adenomatous polyp of colon 07/2016   Recall 5 yrs (Dr. Adela Lank)  . HSV infection   . IBS (irritable bowel syndrome)   . Insomnia   . Menopausal symptoms    GYN started prometrium and vivelle 08/2016.  . Menorrhagia    + dysmenorrhea and hx of fibroids: GYN eval 07/2017->FSH normal, saline infusion u/s showed normal uterus/endomet + R ovarian cystic nodule suspected to be hemorrhagic cyst vs endometrioma.  CA 125 level normal.  Pt offered Mirena or hysterectomy and she declined both.  . Multinodular thyroid 06/2020   one cystic/nodule lesion requiring 1 yr f/u ultrasound.  Euthyroid as of 06/2020.  Marland Kitchen Neck pain, musculoskeletal 02/06/2011  . 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 Postprandial bloating 08/2016   ? IBS.  GYN MD started Bentyl trial 08/2016.  . 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 Seasonal allergic rhinitis   . Stress fracture 2008   left ankle      Objective/Physical Exam Neurovascular status intact.  Skin incisions appear to be well coapted with sutures intact. No sign of infectious process noted. No dehiscence. No active bleeding noted. Moderate edema noted to the surgical extremity.   Assessment: 1. s/p left great toe arthroplasty with implant. DOS: 06/24/2020   Plan of Care:  1. Patient was evaluated.  2.  Dressings changed today. 3.  Patient is doing very well today.   She may begin to slowly transition out of the postsurgical shoe and good supportive sneakers 4.  Patient is hoping to return to work next week, 07/12/2020.  Patient okay to return to work with either the boot or postsurgical shoe 5.  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    Felecia Shelling, DPM [facility]  Dr. Felecia Shelling, DPM    [address] [place], [place] [address]                Office [phone]  Fax [phone]

## 2020-07-15 ENCOUNTER — Encounter: Payer: Commercial Managed Care - PPO | Attending: Family Medicine | Admitting: Dietician

## 2020-07-15 ENCOUNTER — Ambulatory Visit: Payer: Commercial Managed Care - PPO

## 2020-07-15 ENCOUNTER — Encounter: Payer: Self-pay | Admitting: Dietician

## 2020-07-15 ENCOUNTER — Other Ambulatory Visit: Payer: Self-pay

## 2020-07-15 VITALS — Ht 63.5 in | Wt 144.9 lb

## 2020-07-15 DIAGNOSIS — E119 Type 2 diabetes mellitus without complications: Secondary | ICD-10-CM | POA: Insufficient documentation

## 2020-07-15 NOTE — Patient Instructions (Addendum)
Continue to do your yoga and walking when you recover.  Try upper body exercises with light dumbbells.   Work towards eating three meals a day, about 5-6 hours apart!  Begin to recognize carbohydrates in your food choices!  Have 3 carb choices at each meal (45 g).   Begin to build your meals using the proportions of the Balanced Plate. . First, select your carb choice(s) for the meal, and determine how much you should have to equal 3 carb choices (45 g). . Next, select your source of protein to pair with your carb choice(s). . Finally, complete the remaining half of your meal with a variety of non-starchy vegetables.

## 2020-07-15 NOTE — Progress Notes (Signed)
Diabetes Self-Management Education  Visit Type: First/Initial  Appt. Start Time: 1830 Appt. End Time: 1930  07/15/2020  [name], identified by name and date of birth, is a 55 y.o. female with a diagnosis of Diabetes: Type 2.   ASSESSMENT Pt had a joint replacement in her left foot and is doing very well with it. 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 Pt has been taking prilosec daily for about two years, no diagnosis of GERD but had reflux. 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 States that it is a big motivator for her to get her A1c down. Pt reports her mother is an insulin dependent type 2 diabetic. Pt states her health is fair.  States she recently noticed her thyroid was swolen and is meeting with her endocrinologist to follow up. Pt reports doing no exercise, waiting for her foot to heal up. Pt is eager to resume physical activity upon clearance from her podiatrist. Pt takes a women's daily MV.   Height 5' 3.5" (1.613 m), weight 144 lb 14.4 oz (65.7 kg). Body mass index is 25.27 kg/m.   Diabetes Self-Management Education - 07/15/20 1842      Visit Information   Visit Type First/Initial      Initial Visit   Diabetes Type Type 2    Are you currently following a meal plan? No    Are you taking your medications as prescribed? Not on Medications    Date Diagnosed 06/22/2020      Health Coping   How would you rate your overall health? Fair      Psychosocial Assessment   Patient Belief/Attitude about Diabetes Motivated to manage diabetes    Self-care barriers None    Self-management support Family    Other persons present Patient    Patient Concerns Medication;Healthy Lifestyle    Special Needs None    Preferred Learning Style No preference indicated    How often do you need to have someone help you when you read instructions, pamphlets, or other written materials from your doctor or pharmacy? 1 - Never    What is the last grade level you completed in  school? BS in education      Pre-Education Assessment   Patient understands the diabetes disease and treatment process. Needs Instruction    Patient understands incorporating nutritional management into lifestyle. Needs Instruction    Patient undertands incorporating physical activity into lifestyle. Needs Instruction    Patient understands using medications safely. Needs Instruction    Patient understands monitoring blood glucose, interpreting and using results Needs Instruction    Patient understands prevention, detection, and treatment of acute complications. Needs Instruction    Patient understands prevention, detection, and treatment of chronic complications. Needs Instruction    Patient understands how to develop strategies to address psychosocial issues. Needs Instruction    Patient understands how to develop strategies to promote health/change behavior. Needs Instruction      Complications   Last HgB A1C per patient/outside source 8.5 %   06/22/2020   How often do you check your blood sugar? 0 times/day (not testing)    Have you had a dilated eye exam in the past 12 months? Yes    Have you had a dental exam in the past 12 months? No   COVID postponed   Are you checking your feet? Yes    How many days per week are you checking your feet? 7      Dietary Intake  Breakfast Belvita breakfast biscuits, coffee    Snack (morning) none    Lunch Chicken quesadilla, potato chips, water    Snack (afternoon) none    Dinner Bowl of beef and vegetables stew, Fritos, water    Beverage(s) water, diet coke, seltzer      Exercise   Exercise Type ADL's    How many days per week to you exercise? 0    How many minutes per day do you exercise? 0    Total minutes per week of exercise 0      Patient Education   Previous Diabetes Education No    Disease state  Definition of diabetes, type 1 and 2, and the diagnosis of diabetes;Factors that contribute to the development of diabetes;Explored  patient's options for treatment of their diabetes    Nutrition management  Carbohydrate counting;Food label reading, portion sizes and measuring food.    Physical activity and exercise  Helped patient identify appropriate exercises in relation to his/her diabetes, diabetes complications and other health issue.    Chronic complications Relationship between chronic complications and blood glucose control    Psychosocial adjustment Role of stress on diabetes;Identified and addressed patients feelings and concerns about diabetes    Personal strategies to promote health Lifestyle issues that need to be addressed for better diabetes care      Individualized Goals (developed by patient)   Nutrition Follow meal plan discussed    Physical Activity Exercise 3-5 times per week    Medications Not Applicable    Monitoring  Not Applicable      Post-Education Assessment   Patient understands the diabetes disease and treatment process. Demonstrates understanding / competency    Patient understands incorporating nutritional management into lifestyle. Demonstrates understanding / competency    Patient undertands incorporating physical activity into lifestyle. Demonstrates understanding / competency    Patient understands prevention, detection, and treatment of chronic complications. Demonstrates understanding / competency    Patient understands how to develop strategies to address psychosocial issues. Demonstrates understanding / competency    Patient understands how to develop strategies to promote health/change behavior. Demonstrates understanding / competency      Outcomes   Expected Outcomes Demonstrated interest in learning. Expect positive outcomes    Future DMSE PRN    Program Status Completed           Individualized Plan for Diabetes Self-Management Training:   Learning Objective:  Patient will have a greater understanding of diabetes self-management. Patient education plan is to attend  individual and/or group sessions per assessed needs and concerns.   Plan:   Patient Instructions  Continue to do your yoga and walking when you recover.  Try upper body exercises with light dumbbells.   Work towards eating three meals a day, about 5-6 hours apart!  Begin to recognize carbohydrates in your food choices!  Have 3 carb choices at each meal (45 g).   Begin to build your meals using the proportions of the Balanced Plate. . First, select your carb choice(s) for the meal, and determine how much you should have to equal 3 carb choices (45 g). . Next, select your source of protein to pair with your carb choice(s). . Finally, complete the remaining half of your meal with a variety of non-starchy vegetables.    Expected Outcomes:  Demonstrated interest in learning. Expect positive outcomes  Education material provided: Meal plan card and My Plate  If problems or questions, patient to contact team via:  Phone and Email  Future DSME appointment: PRN

## 2020-07-22 ENCOUNTER — Ambulatory Visit: Payer: Commercial Managed Care - PPO

## 2020-07-23 ENCOUNTER — Ambulatory Visit: Payer: Commercial Managed Care - PPO | Admitting: Family Medicine

## 2020-07-26 ENCOUNTER — Encounter: Payer: Commercial Managed Care - PPO | Admitting: Podiatry

## 2020-07-29 ENCOUNTER — Ambulatory Visit: Payer: Commercial Managed Care - PPO

## 2020-08-04 ENCOUNTER — Other Ambulatory Visit: Payer: Self-pay

## 2020-08-04 ENCOUNTER — Ambulatory Visit (INDEPENDENT_AMBULATORY_CARE_PROVIDER_SITE_OTHER): Payer: Commercial Managed Care - PPO | Admitting: Podiatry

## 2020-08-04 ENCOUNTER — Ambulatory Visit (INDEPENDENT_AMBULATORY_CARE_PROVIDER_SITE_OTHER): Payer: Commercial Managed Care - PPO

## 2020-08-04 DIAGNOSIS — Z9889 Other specified postprocedural states: Secondary | ICD-10-CM | POA: Diagnosis not present

## 2020-08-04 NOTE — Progress Notes (Signed)
   Subjective:  Patient presents today status post left great toe arthroplasty with implant. DOS: 06/24/2020.  Patient has been doing very well.  Minimal pain.  She has been walking in regular shoes with no pain.  No new complaints at this time  Past Medical History:  Diagnosis Date  . Allergy    seasonal  . Depression   . Dysmenorrhea   . Dyspepsia 08/2017   vs gastritis.  MUCH improved with PPI + getting anxiety controlled.  . Fatty liver 08/2017   u/s  . Fibroid uterus 08/2016   U/s at GYN  . GAD (generalized anxiety disorder)   . Gestational diabetes   . History of adenomatous polyp of colon 07/2016   Recall 5 yrs (Dr. Havery Moros)  . HSV infection   . IBS (irritable bowel syndrome)   . Insomnia   . Menopausal symptoms    GYN started prometrium and vivelle 08/2016.  . Menorrhagia    + dysmenorrhea and hx of fibroids: GYN eval 07/2017->FSH normal, saline infusion u/s showed normal uterus/endomet + R ovarian cystic nodule suspected to be hemorrhagic cyst vs endometrioma.  CA 125 level normal.  Pt offered Mirena or hysterectomy and she declined both.  . Multinodular thyroid 06/2020   one cystic/nodule lesion requiring 1 yr f/u ultrasound.  Euthyroid as of 06/2020.  Marland Kitchen Neck pain, musculoskeletal 02/06/2011  . 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 Postprandial bloating 08/2016   ? IBS.  GYN MD started Bentyl trial 08/2016.  . 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 Seasonal allergic rhinitis   . Stress fracture 2008   left ankle      Objective/Physical Exam Neurovascular status intact.  Skin incisions appear to be well coapted and healed. No sign of infectious process noted. No dehiscence. No active bleeding noted. Moderate edema noted to the surgical extremity.   Assessment: 1. s/p left great toe arthroplasty with implant. DOS: 06/24/2020   Plan of Care:  1. Patient was evaluated.  2.  Patient may now resume full activity no restrictions. 3.  Continue wearing  good supportive shoes 4.  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 Edrick Kins, DPM    [address], [place] [address]                Office [phone]  Fax [phone]

## 2020-08-05 ENCOUNTER — Ambulatory Visit: Payer: Commercial Managed Care - PPO | Admitting: Internal Medicine

## 2020-08-13 ENCOUNTER — Ambulatory Visit (INDEPENDENT_AMBULATORY_CARE_PROVIDER_SITE_OTHER): Payer: Commercial Managed Care - PPO | Admitting: Family Medicine

## 2020-08-13 ENCOUNTER — Encounter: Payer: Self-pay | Admitting: Family Medicine

## 2020-08-13 ENCOUNTER — Other Ambulatory Visit: Payer: Self-pay

## 2020-08-13 VITALS — BP 132/89 | HR 91 | Temp 97.8°F | Resp 16 | Ht 63.0 in | Wt 146.4 lb

## 2020-08-13 DIAGNOSIS — E119 Type 2 diabetes mellitus without complications: Secondary | ICD-10-CM | POA: Diagnosis not present

## 2020-08-13 DIAGNOSIS — E042 Nontoxic multinodular goiter: Secondary | ICD-10-CM | POA: Diagnosis not present

## 2020-08-13 NOTE — Progress Notes (Signed)
OFFICE VISIT  08/13/2020  CC:  Chief Complaint  Patient presents with  . Follow-up    Thyroid, 4 week.     HPI:    Patient is a 55 y.o. Caucasian female who presents for 6 wk f/u thyromegaly. A/P as of last visit: "1) Anx/dep: stable.  Continue cymbalta 107m qd and alpraz 0.532mtid prn. Updated CSC and did UDS today. RF'd alpraz 0.92m56m1 tid prn, #60, RF x 5.  2) insomnia: stable. Cont ambien 63m21ms, rx'd #30 with RF x 5 today.  3) Prediabetes: has made some TLC's but hopes to improved on exercise pretty soon after she gets toe surgery. Fasting glucose and Hba1c today.  4) Thyromegaly: has mild (asymptomatic) tachycardia and has a feeling of globus sensation in conjunction with recently noting thyroid enlargement. Just noted about 3 d/a. Question hashimoto thyroiditis.   Check TSH, T4, T3, ESR, CRP, TPO ab's, and CBC w/diff today. Considering thyroid u/s and radioactive iodine uptake and scan at this time, nothing ordered yet. Will wait on thyroid lab work first."  INTERIM HX: Labs last visit showed new dx DM 2 (gluc 132, a1c 6.6%). I referred her to nutritionist. All thyroid labs were normal, as were remainder of her labs.  06/29/20 Thyroid ultrasound. IMPRESSION: 1. Findings suggestive of multinodular goiter. 2. Nodule #7 meets imaging criteria to recommend a 1 year follow-up as indicated. 3. None of the remaining thyroid nodules meet imaging criteria to recommend percutaneous sampling or continued dedicated follow-up.  CURRENTLY: Went to nutritionist and felt reassured, learned a lot. Got her foot surgery 6 wks ago and it went well.  Still with same globus sensation.  No neck compression sensation, no dysphagia. She has endo appt next week.  Anxiety level up more last couple months, primarily tied to some health stress in family + her own recent issues with DM dx and thyroid abnormality. Other than this she does not feel any issue with depression or  sadness.  No hopelessness or anhedonia.  Concentration is fine.  Sleep is off and on. Finds herself using alprazolam more than in the past but still no more than once a day.  She uses ambien nightly as per her usual. Last alpraz rx fill was 06/22/20, #60, rx by me.   Last ambien rx fill was 07/28/20, #30, rx by me.  ROS: no fevers, no CP, no SOB, no wheezing, no cough, no dizziness, no HAs, no rashes, no melena/hematochezia.  No polyuria or polydipsia.  No myalgias or arthralgias.  No focal weakness, paresthesias, or tremors.  No acute vision or hearing abnormalities. No n/v/d or abd pain.  No palpitations.     Past Medical History:  Diagnosis Date  . Allergy    seasonal  . Depression   . Dysmenorrhea   . Dyspepsia 08/2017   vs gastritis.  MUCH improved with PPI + getting anxiety controlled.  . Fatty liver 08/2017   u/s  . Fibroid uterus 08/2016   U/s at GYN  . GAD (generalized anxiety disorder)   . Gestational diabetes   . History of adenomatous polyp of colon 07/2016   Recall 5 yrs (Dr. ArmbHavery Moros HSV infection   . IBS (irritable bowel syndrome)   . Insomnia   . Menopausal symptoms    GYN started prometrium and vivelle 08/2016.  . Menorrhagia    + dysmenorrhea and hx of fibroids: GYN eval 07/2017->FSH normal, saline infusion u/s showed normal uterus/endomet + R ovarian cystic nodule suspected to  be hemorrhagic cyst vs endometrioma.  CA 125 level normal.  Pt offered Mirena or hysterectomy and she declined both.  . Multinodular thyroid 06/2020   one cystic/nodule lesion requiring 1 yr f/u ultrasound.  Euthyroid as of 06/2020.  Marland Kitchen Neck pain, musculoskeletal 02/06/2011  . 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 Postprandial bloating 08/2016   ? IBS.  GYN MD started Bentyl trial 08/2016.  . 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 Seasonal allergic rhinitis   . Stress fracture 2008   left ankle    Past Surgical History:  Procedure Laterality Date  . BUNIONECTOMY   08/2013   right  . CESAREAN SECTION  1997   X 1  . COLONOSCOPY W/ POLYPECTOMY  07/12/2016   Tubular adenoma: recall 07/2021.  +Diverticulosis L colon.  . cyst removed from back of right leg  6-12  . ENDOMETRIAL ABLATION  2010   HER OPTION ABLATION FOR MENORRHAGIA  . EYE SURGERY  age 8   right eye for strabismus    Outpatient Medications Prior to Visit  Medication Sig Dispense Refill  . ALPRAZolam (XANAX) 0.5 MG tablet 1 tab po tid prn severe anxiety 60 tablet 5  . DULoxetine (CYMBALTA) 60 MG capsule Take 1 capsule (60 mg total) by mouth daily. 90 capsule 3  . ESTROGEL 0.75 MG/1.25 GM (0.06%) topical gel SMARTSIG:Topical    . omeprazole (PRILOSEC) 40 MG capsule TAKE 1 CAPSULE BY MOUTH EVERY DAY 90 capsule 3  . progesterone (PROMETRIUM) 100 MG capsule Take 100 mg by mouth at bedtime.    Marland Kitchen testosterone (ANDROGEL) 50 MG/5GM (1%) GEL Place onto the skin daily.    Marland Kitchen zolpidem (AMBIEN) 10 MG tablet Take 1 tablet (10 mg total) by mouth at bedtime as needed. 30 tablet 5  . ibuprofen (ADVIL) 800 MG tablet Take 1 tablet (800 mg total) by mouth 3 (three) times daily. (Patient not taking: Reported on 08/13/2020) 90 tablet 1  . oxyCODONE-acetaminophen (PERCOCET) 5-325 MG tablet Take 1 tablet by mouth every 4 (four) hours as needed for severe pain. (Patient not taking: No sig reported) 30 tablet 0   No facility-administered medications prior to visit.    No Known Allergies  ROS As per HPI  PE: Vitals with BMI 08/13/2020 07/15/2020 06/22/2020  Height 5' 3"  5' 3.5" 5' 3"   Weight 146 lbs 6 oz 144 lbs 14 oz 143 lbs 10 oz  BMI 25.94 83.66 29.47  Systolic 654 - 650  Diastolic 89 - 84  Pulse 91 - 105     Gen: Alert, well appearing.  Patient is oriented to person, place, time, and situation. AFFECT: pleasant, lucid thought and speech. Neck: minimally palpable thyroid gland (symmetric),  w/out nodularity or tenderness. CV: RRR, no m/r/g.   LUNGS: CTA bilat, nonlabored resps, good aeration in all  lung fields. EXT: no clubbing or cyanosis.  no edema.    LABS:    Chemistry      Component Value Date/Time   NA 137 06/22/2020 1122   K 4.0 06/22/2020 1122   CL 102 06/22/2020 1122   CO2 27 06/22/2020 1122   BUN 15 06/22/2020 1122   CREATININE 0.73 06/22/2020 1122   CREATININE 0.67 07/06/2017 1548      Component Value Date/Time   CALCIUM 9.8 06/22/2020 1122   ALKPHOS 51 12/15/2019 0913   AST 17 12/15/2019 0913   ALT 15 12/15/2019 0913   BILITOT 0.4 12/15/2019 0913     Lab Results  Component Value  Date   TSH 0.73 06/22/2020   T3TOTAL 84 06/22/2020  Free T4 = 0.84 (normal) on 06/22/20  Lab Results  Component Value Date   WBC 7.3 06/22/2020   HGB 14.1 06/22/2020   HCT 42.4 06/22/2020   MCV 88.4 06/22/2020   PLT 335.0 06/22/2020    IMPRESSION AND PLAN:  1) DM 2, recent dx. Discussed dx, ongoing diet/exercise recommendations, routine monitoring, etc. No meds at this time.  She is quite apprehensive b/c her mom had dm and required insulin. Plan recheck a1c in 3 mo.  Will get urine microalb/cr at that time.  2) Thyromegaly/multinodular goiter. Ultrasound reviewed again--no nodules fit criteria for bx, one fit criteria for surveillance imaging. LABS: Euthyroid, neg Ab's, neg inflamm markers --all 6 wks ago. She has her initial appt with Dr. Cruzita Lederer for eval next week.  An After Visit Summary was printed and given to the patient.  FOLLOW UP: Return in about 3 months (around 11/10/2020) for routine chronic illness f/u.  Signed:  Crissie Sickles, MD           08/13/2020

## 2020-08-17 ENCOUNTER — Ambulatory Visit (INDEPENDENT_AMBULATORY_CARE_PROVIDER_SITE_OTHER): Payer: Commercial Managed Care - PPO | Admitting: Internal Medicine

## 2020-08-17 ENCOUNTER — Other Ambulatory Visit: Payer: Self-pay

## 2020-08-17 ENCOUNTER — Encounter: Payer: Self-pay | Admitting: Internal Medicine

## 2020-08-17 VITALS — BP 130/80 | HR 104 | Ht 63.5 in | Wt 148.6 lb

## 2020-08-17 DIAGNOSIS — E042 Nontoxic multinodular goiter: Secondary | ICD-10-CM | POA: Diagnosis not present

## 2020-08-17 NOTE — Patient Instructions (Signed)
Please come back for another visit in January 2023.   Thyroid Nodule  A thyroid nodule is an isolated growth of thyroid cells that forms a lump in your thyroid gland. The thyroid gland is a butterfly-shaped gland. It is found in the lower front of your neck. This gland sends chemical messengers (hormones) through your blood to all parts of your body. These hormones are important in regulating your body temperature and helping your body to use energy. Thyroid nodules are common. Most are not cancerous (benign). You may have one nodule or several nodules. Different types of thyroid nodules include nodules that:  Grow and fill with fluid (thyroid cysts).  Produce too much thyroid hormone (hot nodules or hyperthyroid).  Produce no thyroid hormone (cold nodules or hypothyroid).  Form from cancer cells (thyroid cancers). What are the causes? In most cases, the cause of this condition is not known. What increases the risk? The following factors may make you more likely to develop this condition.  Age. Thyroid nodules become more common in people who are older than 55 years of age.  Gender. ? Benign thyroid nodules are more common in women. ? Cancerous (malignant) thyroid nodules are more common in men.  A family history that includes: ? Thyroid nodules. ? Pheochromocytoma. ? Thyroid carcinoma. ? Hyperparathyroidism.  Certain kinds of thyroid diseases, such as Hashimoto's thyroiditis.  Lack of iodine in your diet.  A history of head and neck radiation, such as from previous cancer treatment. What are the signs or symptoms? In many cases, there are no symptoms. If you have symptoms, they may include:  A lump in your lower neck.  Feeling a lump or tickle in your throat.  Pain in your neck, jaw, or ear.  Having trouble swallowing. Hot nodules may cause symptoms that include:  Weight loss.  Warm, flushed skin.  Feeling hot.  Feeling nervous.  A racing heartbeat. Cold  nodules may cause symptoms that include:  Weight gain.  Dry skin.  Brittle hair. This may also occur with hair loss.  Feeling cold.  Fatigue. Thyroid cancer nodules may cause symptoms that include:  Hard nodules that feel stuck to the thyroid gland.  Hoarseness.  Lumps in the glands near your thyroid (lymph nodes). How is this diagnosed? A thyroid nodule may be felt by your health care provider during a physical exam. This condition may also be diagnosed based on your symptoms. You may also have tests, including:  An ultrasound. This may be done to confirm the diagnosis.  A biopsy. This involves taking a sample from the nodule and looking at it under a microscope.  Blood tests to make sure that your thyroid is working properly.  A thyroid scan. This test uses a radioactive tracer injected into a vein to create an image of the thyroid gland on a computer screen.  Imaging tests such as MRI or CT scan. These may be done if: ? Your nodule is large. ? Your nodule is blocking your airway. ? Cancer is suspected. How is this treated? Treatment depends on the cause and size of your nodule or nodules. If the nodule is benign, treatment may not be necessary. Your health care provider may monitor the nodule to see if it goes away without treatment. If the nodule continues to grow, is cancerous, or does not go away, treatment may be needed. Treatment may include:  Having a cystic nodule drained with a needle.  Ablation therapy. In this treatment, alcohol is injected into the  area of the nodule to destroy the cells. Ablation with heat (thermal ablation) may also be used.  Radioactive iodine. In this treatment, radioactive iodine is given as a pill or liquid that you drink. This substance causes the thyroid nodule to shrink.  Surgery to remove the nodule. Part or all of your thyroid gland may need to be removed as well.  Medicines. Follow these instructions at home:  Pay attention to  any changes in your nodule.  Take over-the-counter and prescription medicines only as told by your health care provider.  Keep all follow-up visits as told by your health care provider. This is important. Contact a health care provider if:  Your voice changes.  You have trouble swallowing.  You have pain in your neck, ear, or jaw that is getting worse.  Your nodule gets bigger.  Your nodule starts to make it harder for you to breathe.  Your muscles look like they are shrinking (muscle wasting). Get help right away if:  You have chest pain.  There is a loss of consciousness.  You have a sudden fever.  You feel confused.  You are seeing or hearing things that other people do not see or hear (having hallucinations).  You feel very weak.  You have mood swings.  You feel very restless.  You feel suddenly nauseous or throw up.  You suddenly have diarrhea. Summary  A thyroid nodule is an isolated growth of thyroid cells that forms a lump in your thyroid gland.  Thyroid nodules are common. Most are not cancerous (benign). You may have one nodule or several nodules.  Treatment depends on the cause and size of your nodule or nodules. If the nodule is benign, treatment may not be necessary.  Your health care provider may monitor the nodule to see if it goes away without treatment. If the nodule continues to grow, is cancerous, or does not go away, treatment may be needed. This information is not intended to replace advice given to you by your health care provider. Make sure you discuss any questions you have with your health care provider. Document Revised: 02/01/2018 Document Reviewed: 02/04/2018 Elsevier Patient Education  [place].

## 2020-08-17 NOTE — Progress Notes (Signed)
Patient ID: Karen Oconnell, female   DOB: 1965-09-03, 55 y.o.   MRN: [id number]   This visit occurred during the SARS-CoV-2 public health emergency.  Safety protocols were in place, including screening questions prior to the visit, additional usage of staff PPE, and extensive cleaning of exam room while observing appropriate contact time as indicated for disinfecting solutions.   HPI  Karen Oconnell is a 55 y.o.-year-old female, referred by her PCP, Dr. Anitra Lauth, for evaluation for multiple thyroid nodules.  Patient describes that last summer she started HRT and she started to feel much better afterwards: Her hot flashes resolved, she also lost weight.  However, towards the end of the year, she started to gain weight again, she had more irritability, her allergies returned, and she also noticed a lump in her throat.  She saw Dr. Anitra Lauth, who checked a thyroid ultrasound that showed multiple thyroid cystic nodules.  Since then, her symptoms improved and she does not feel her neck is swollen as before.  Thyroid U/S (06/29/2020): Multiple nodules: Parenchymal Echotexture: Moderately heterogenous Isthmus: Normal in size measuring 0.3 cm in diameter Right lobe: Normal in size measuring 4.3 x 1.6 x 1.1 cm Left lobe: Normal in size measuring 4.8 x 1.8 x 1.7 cm _____________________________________________________  Estimated total number of nodules >/= 1 cm: 6-10 _________________________________________________________  Scattered punctate (1 cm) right-sided spongiform/benign-appearing right-sided thyroid nodules (labeled #1 through #3), none of which meet imaging criteria to recommend percutaneous sampling or continued dedicated follow-up with nodules labeled #1 and #2 both containing benign colloid. ________________________________________________________  There is an approximately 1.4 cm minimally complex cyst within the superior pole the left lobe of the thyroid labeled 4), which  does not meet criteria to recommend percutaneous sampling or continued dedicated follow-up.  There is an approximately 1.2 cm mixed cystic and solid nodule within the mid aspect the left lobe of the thyroid (labeled 5), which does not meet criteria to recommend percutaneous sampling or continued dedicated follow-up  There is an approximately 1.6 cm partially solid though predominantly cystic nodule within mid aspect the left lobe of the thyroid (labeled 6), which does not meet criteria to recommend percutaneous sampling or continued dedicated follow-up. ________________________________________________________  Nodule # 7: Location: Left; Inferior Maximum size: 2.2 cm; Other 2 dimensions: 1.5 x 1.3 cm Composition: solid/almost completely solid (2) Echogenicity: isoechoic (1) *Given size (>/= 1.5 - 2.4 cm) and appearance, a follow-up ultrasound in 1 year should be considered based on TI-RADS criteria. _________________________________________________________  IMPRESSION: 1. Findings suggestive of multinodular goiter. 2. Nodule #7 meets imaging criteria to recommend a 1 year follow-up as indicated. 3. None of the remaining thyroid nodules meet imaging criteria to recommend percutaneous sampling or continued dedicated follow-up.        Pt denies: - feeling nodules in neck - hoarseness - dysphagia - choking - SOB with lying down She does have globus sensation.  I reviewed pt's thyroid tests: Lab Results  Component Value Date   TSH 0.73 06/22/2020   TSH 0.91 12/15/2019   TSH 0.55 04/27/2017   TSH 0.64 10/08/2015   TSH 0.48 04/09/2014   TSH 0.783 05/15/2013   TSH 0.54 03/02/2010   FREET4 0.84 06/22/2020    TPO antibodies were not elevated: Component     Latest Ref Rng & Units 06/22/2020  Thyroperoxidase Ab SerPl-aCnc     <9 IU/mL 1   Pt mentions improved hair loss and hot flashes.  She does mention: - fatigue - anxiety/depression - Weight gain -  Dry  skin - Hair loss  No FH of thyroid ds. No FH of thyroid cancer. No h/o radiation tx to head or neck.  No seaweed or kelp. No recent contrast studies. + steroid use - every 3 month - spine. No herbal supplements. No Biotin supplements or Hair, Skin and Nails vitamins. On MVI.  Pt also has a recent history of diabetes (diagnosed around the time of her thyroid nodules), previously prediabetes.  HbA1c from 1.5 months ago was 6.6%.  Her mother had diabetes and was insulin-dependent.  She saw nutrition and started to change her diet.    On HRT by ObGyn.  ROS: Constitutional: + See HPI  Eyes: + Blurry vision, no xerophthalmia ENT: + Sore throat,  + see HPI, + decreased hearing, + see HPI Cardiovascular: no CP/+ SOB/+ palpitations/+ leg swelling Respiratory: no cough/+ SOB Gastrointestinal: no N/V/D/C/+ heartburn Musculoskeletal: no muscle/joint aches Skin: no rashes, + see HPI, + easy bruising, + rash Neurological: no tremors/numbness/tingling/dizziness Psychiatric: + depression/anxiety + Low libido until she started HRT  Past Medical History:  Diagnosis Date  . Allergy    seasonal  . Depression   . Dysmenorrhea   . Dyspepsia 08/2017   vs gastritis.  MUCH improved with PPI + getting anxiety controlled.  . Fatty liver 08/2017   u/s  . Fibroid uterus 08/2016   U/s at GYN  . GAD (generalized anxiety disorder)   . Gestational diabetes   . History of adenomatous polyp of colon 07/2016   Recall 5 yrs (Dr. Havery Moros)  . HSV infection   . IBS (irritable bowel syndrome)   . Insomnia   . Menopausal symptoms    GYN started prometrium and vivelle 08/2016.  . Menorrhagia    + dysmenorrhea and hx of fibroids: GYN eval 07/2017->FSH normal, saline infusion u/s showed normal uterus/endomet + R ovarian cystic nodule suspected to be hemorrhagic cyst vs endometrioma.  CA 125 level normal.  Pt offered Mirena or hysterectomy and she declined both.  . Multinodular thyroid 06/2020   one cystic/nodule  lesion requiring 1 yr f/u ultrasound.  Euthyroid as of 06/2020.  Marland Kitchen Neck pain, musculoskeletal 02/06/2011  . 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 Postprandial bloating 08/2016   ? IBS.  GYN MD started Bentyl trial 08/2016.  . 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 Seasonal allergic rhinitis   . Stress fracture 2008   left ankle   Past Surgical History:  Procedure Laterality Date  . BUNIONECTOMY  08/2013   right  . CESAREAN SECTION  1997   X 1  . COLONOSCOPY W/ POLYPECTOMY  07/12/2016   Tubular adenoma: recall 07/2021.  +Diverticulosis L colon.  . cyst removed from back of right leg  6-12  . ENDOMETRIAL ABLATION  2010   HER OPTION ABLATION FOR MENORRHAGIA  . EYE SURGERY  age 59   right eye for strabismus   Social History   Socioeconomic History  . Marital status: Married    Spouse name: Not on file  . Number of children: 2  . Years of education: Not on file  . Highest education level: Not on file  Occupational History  . Occupation: Pharmacist, hospital  Tobacco Use  . Smoking status: Never Smoker  . Smokeless tobacco: Never Used  Vaping Use  . Vaping Use: Never used  Substance and Sexual Activity  . Alcohol use: Yes    Alcohol/week: 0.0 standard drinks    Comment: occasionaly  . Drug use: No  .  Sexual activity: Yes    Partners: Male    Birth control/protection: Other-see comments, Surgical    Comment: husband vasectomy  Other Topics Concern  . 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   No T/A/Ds.   Social Determinants of Health   Financial Resource Strain: Not on file  Food Insecurity: Not on file  Transportation Needs: Not on file  Physical Activity: Not on file  Stress: Not on file  Social Connections: Not on file  Intimate Partner Violence: Not on file   Current Outpatient Medications on File Prior to Visit  Medication Sig Dispense Refill  . ALPRAZolam (XANAX) 0.5 MG tablet 1 tab po tid prn  severe anxiety 60 tablet 5  . DULoxetine (CYMBALTA) 60 MG capsule Take 1 capsule (60 mg total) by mouth daily. 90 capsule 3  . ESTROGEL 0.75 MG/1.25 GM (0.06%) topical gel SMARTSIG:Topical    . omeprazole (PRILOSEC) 40 MG capsule TAKE 1 CAPSULE BY MOUTH EVERY DAY 90 capsule 3  . progesterone (PROMETRIUM) 100 MG capsule Take 100 mg by mouth at bedtime.    Marland Kitchen testosterone (ANDROGEL) 50 MG/5GM (1%) GEL Place onto the skin daily.    Marland Kitchen zolpidem (AMBIEN) 10 MG tablet Take 1 tablet (10 mg total) by mouth at bedtime as needed. 30 tablet 5   No current facility-administered medications on file prior to visit.   No Known Allergies Family History  Problem Relation Age of Onset  . Diabetes Mother        Type 2  . Hypertension Mother   . Hyperlipidemia Mother   . Depression Mother   . Heart disease Mother   . Seasonal affective disorder Father   . Cancer Father        MELANOMA  . Bipolar disorder Brother   . Drug abuse Brother        cocaine, vicodin, pain killers  . ADD / ADHD Son   . Depression Son   . Cancer Maternal Grandmother        gyn possibly cervical or uterine  . Diabetes Maternal Grandfather        Type 2  . Hypertension Maternal Grandfather   . Depression Maternal Grandfather   . Alcohol abuse Paternal Grandfather   . Colon cancer Neg Hx   . Esophageal cancer Neg Hx   . Rectal cancer Neg Hx   . Stomach cancer Neg Hx     PE: BP 130/80 (BP Location: Right Arm, Patient Position: Sitting, Cuff Size: Normal)   Pulse (!) 104   Ht 5' 3.5" (1.613 m)   Wt 148 lb 9.6 oz (67.4 kg)   SpO2 96%   BMI 25.91 kg/m  Wt Readings from Last 3 Encounters:  08/17/20 148 lb 9.6 oz (67.4 kg)  08/13/20 146 lb 6.4 oz (66.4 kg)  07/15/20 144 lb 14.4 oz (65.7 kg)   Constitutional: normal weight, in NAD Eyes: PERRLA, EOMI, no exophthalmos ENT: moist mucous membranes, no thyromegaly, no cervical lymphadenopathy Cardiovascular: RRR, No MRG Respiratory: CTA B Gastrointestinal: abdomen soft,  NT, ND, BS+ Musculoskeletal: no deformities, strength intact in all 4;  Skin: moist, warm, no rashes Neurological: no tremor with outstretched hands, DTR normal in all 4  ASSESSMENT: 1.  Multinodular goiter  PLAN: 1. Thyroid nodules - I reviewed the images of her thyroid ultrasound along with the patient. I pointed out most of the nodules are cystic, smaller than 2 cm, and not worrisome.  There is a left inferior nodule measuring 2.2 cm in the largest dimension which appears solid, however, it appears isoechoic, which is a good prognostic factor. Otherwise, the nodule is: - without microcalcifications - without internal blood flow - more wide than tall - well delimited from surrounding tissue Pt does not have a thyroid cancer family history or a personal history of RxTx to head/neck. All these would favor benignity.  - Also, but TFTs were normal in the past and a recent evaluation by PCP.  We will not repeat these today.  She has several symptoms including hair loss,  hot flashes, and previous irritability, which have improved and which I explained were likely not related to the thyroid, since her thyroid function was normal. - We reviewed her previous symptoms including globus sensation and also a visible lump in her throat.  This may have been related to thyroiditis, but it is unclear about the actual cause.  Discussed about possible causes for this condition and I explained that these are usually reversible.  PCP tested her for autoimmune thyroiditis and her TPO antibodies were negative.  We did discuss that this condition may occur in the future and in that case, I recommended to take ibuprofen if the symptoms are pronounced.  However, otherwise, we can repeat her thyroid ultrasound at next visit to follow her left solid thyroid nodule but no intervention is needed otherwise. -We discussed the stability of the nodules over 5 years is very reassuring for benignity. -If she develops bothersome  neck compression symptoms, we may need thyroid cyst aspiration, lobectomy, or thyroidectomy - I did explain that, while thyroid surgery is not a complicated one, it still can have side effects and also she might have a risk of ~25% of becoming hypothyroid after hemithyroidectomy.  -I will see her back in a year but I advised her to get in touch with me before next visit so we can check a thyroid ultrasound which we can review at next visit  Philemon Kingdom, MD PhD [facility] Endocrinology

## 2020-10-13 ENCOUNTER — Other Ambulatory Visit: Payer: Self-pay | Admitting: Family Medicine

## 2020-10-30 ENCOUNTER — Other Ambulatory Visit: Payer: Self-pay | Admitting: Family Medicine

## 2020-11-10 ENCOUNTER — Ambulatory Visit: Payer: Commercial Managed Care - PPO | Admitting: Dietician

## 2020-11-11 ENCOUNTER — Other Ambulatory Visit: Payer: Self-pay | Admitting: Family Medicine

## 2020-11-16 ENCOUNTER — Other Ambulatory Visit: Payer: Self-pay | Admitting: Family Medicine

## 2020-11-16 DIAGNOSIS — Z1231 Encounter for screening mammogram for malignant neoplasm of breast: Secondary | ICD-10-CM

## 2020-11-30 ENCOUNTER — Other Ambulatory Visit: Payer: Self-pay | Admitting: Family Medicine

## 2020-12-22 ENCOUNTER — Other Ambulatory Visit: Payer: Self-pay | Admitting: Family Medicine

## 2020-12-23 NOTE — Telephone Encounter (Signed)
Requesting: alprazolam Contract: 06/22/20 UDS:9/17/ 19 Last Visit:08/13/20 Next Visit: advised to f/u 3 mo. Last Refill: 06/22/20(60,5)  Requesting: zolpidem Contract: 06/22/20 UDS:03/19/18 Last Visit:08/13/20 Next Visit: advised to f/u 3 mo. Last Refill: 06/22/20(30,5)  RF request for omeprazole LOV: 08/13/20 Next ov: advised to f/u 3 mo. Last written: 10/14/20(90,0)   Please Advise. Medications pending

## 2020-12-23 NOTE — Telephone Encounter (Signed)
OK, rx's filled. Pt needs office f/u sometime in the next 2 months.

## 2020-12-23 NOTE — Telephone Encounter (Signed)
LM for pt to return call regarding refills. See message below.

## 2020-12-24 NOTE — Telephone Encounter (Signed)
Spoke with pt regarding med and instructions.   

## 2020-12-29 ENCOUNTER — Other Ambulatory Visit: Payer: Self-pay | Admitting: Family Medicine

## 2020-12-29 MED ORDER — ZOLPIDEM TARTRATE 10 MG PO TABS
10.0000 mg | ORAL_TABLET | Freq: Every evening | ORAL | 5 refills | Status: DC | PRN
Start: 2020-12-29 — End: 2021-01-26

## 2020-12-29 NOTE — Telephone Encounter (Signed)
Left detailed message for pt advising refill sent to new pharmacy, pharmacy updated in chart.

## 2020-12-29 NOTE — Telephone Encounter (Signed)
Spoke with pt and she is in the process of moving and changing pharmacies.   Contract: 06/22/20 UDS:06/22/20 Last Visit: 08/13/20 Next Visit: 01/12/21 Last Refill: 12/23/20(30,5)  Please Advise. Med pending

## 2020-12-29 NOTE — Telephone Encounter (Signed)
Pt is wanting her zolpidem (AMBIEN) 10 MG tablet [[id number]] sent from Pharmacy  [id number] [address]  [place], [place] [address]  Phone:  [phone]  Fax:  [phone]    To [facility]   [place]. Phone [phone]. Please advise pt

## 2021-01-12 ENCOUNTER — Ambulatory Visit
Admission: RE | Admit: 2021-01-12 | Discharge: 2021-01-12 | Disposition: A | Discharge status: Home / Self Care | Payer: Commercial Managed Care - PPO | Source: Ambulatory Visit | Attending: Family Medicine | Admitting: Family Medicine

## 2021-01-12 ENCOUNTER — Ambulatory Visit: Payer: Commercial Managed Care - PPO | Admitting: Family Medicine

## 2021-01-12 ENCOUNTER — Other Ambulatory Visit: Payer: Self-pay

## 2021-01-12 DIAGNOSIS — Z1231 Encounter for screening mammogram for malignant neoplasm of breast: Secondary | ICD-10-CM

## 2021-01-12 NOTE — Progress Notes (Deleted)
OFFICE VISIT  01/12/2021  CC: No chief complaint on file.   HPI:    Patient is a 55 y.o. Caucasian female who presents for 5 mo f/u new dx DM, anx/dep, and insomnia (high risk med use). A/P as of last visit: "1) DM 2, recent dx. Discussed dx, ongoing diet/exercise recommendations, routine monitoring, etc. No meds at this time.  She is quite apprehensive b/c her mom had dm and required insulin. Plan recheck a1c in 3 mo.  Will get urine microalb/cr at that time.   2) Thyromegaly/multinodular goiter. Ultrasound reviewed again--no nodules fit criteria for bx, one fit criteria for surveillance imaging. LABS: Euthyroid, neg Ab's, neg inflamm markers --all 6 wks ago. She has her initial appt with Dr. Cruzita Lederer for eval next week."  INTERIM HX: *** DM: dx'd 06/2020 by a1c criteria (6.6%).  No meds.   Endo eval with Dr. Cruzita Lederer 08/2020 for multinodular goiter-->pt reassured regarding multinodular goiter, no bx indicated, likely had acute thyroiditis of unknown etiology but reassuring since TFTs normal. Dr. Cruzita Lederer recommended f/u with her Jan 2023.  Anx/mood: PMP AWARE reviewed today: most recent rx for *** was filled ***, # ***, rx by ***. No red flags.  Insomnia: PMP AWARE reviewed today: most recent rx for *** was filled ***, # ***, rx by ***. No red flags.   Past Medical History:  Diagnosis Date   Allergy    seasonal   Depression    Dysmenorrhea    Dyspepsia 08/2017   vs gastritis.  MUCH improved with PPI + getting anxiety controlled.   Fatty liver 08/2017   u/s   Fibroid uterus 08/2016   U/s at GYN   GAD (generalized anxiety disorder)    Gestational diabetes    History of adenomatous polyp of colon 07/2016   Recall 5 yrs (Dr. Havery Moros)   HSV infection    IBS (irritable bowel syndrome)    Insomnia    Menopausal symptoms    GYN started prometrium and vivelle 08/2016.   Menorrhagia    + dysmenorrhea and hx of fibroids: GYN eval 07/2017->FSH normal, saline infusion u/s  showed normal uterus/endomet + R ovarian cystic nodule suspected to be hemorrhagic cyst vs endometrioma.  CA 125 level normal.  Pt offered Mirena or hysterectomy and she declined both.   Multinodular thyroid 06/2020   one cystic/nodule lesion requiring 1 yr f/u ultrasound.  Euthyroid as of 06/2020.   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 IBS.  GYN MD started Bentyl trial 08/2016.   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  +Diverticulosis L colon.   cyst removed from back of right leg  6-12   ENDOMETRIAL ABLATION  2010   HER OPTION ABLATION FOR MENORRHAGIA   EYE SURGERY  age 66   right eye for strabismus    Outpatient Medications Prior to Visit  Medication Sig Dispense Refill   ALPRAZolam (XANAX) 0.5 MG tablet TAKE 1 TABLET BY MOUTH 3 TIMES A DAY AS NEEDED FOR SEVERE ANXIETY 60 tablet 5   DULoxetine (CYMBALTA) 60 MG capsule TAKE 1 CAPSULE BY MOUTH EVERY DAY 14  capsule 0   ESTROGEL 0.75 MG/1.25 GM (0.06%) topical gel SMARTSIG:Topical     omeprazole (PRILOSEC) 40 MG capsule TAKE 1 CAPSULE BY MOUTH EVERY DAY 90 capsule 3   progesterone (PROMETRIUM) 100 MG capsule Take 100 mg by mouth at bedtime.     testosterone (ANDROGEL) 50 MG/5GM (1%) GEL Place onto the skin daily.     zolpidem (AMBIEN) 10 MG tablet Take 1 tablet (10 mg total) by mouth at bedtime as needed. 30 tablet 5   No facility-administered medications prior to visit.    No Known Allergies  ROS As per HPI  PE: Vitals with BMI 08/17/2020 08/13/2020 07/15/2020  Height 5' 3.5" 5\' 3"  5' 3.5"  Weight 148 lbs 10 oz 146 lbs 6 oz 144 lbs 14 oz  BMI 25.91 86.76 19.50  Systolic 932 671 -   Diastolic 80 89 -  Pulse 245 91 -     ***  LABS:  Lab Results  Component Value Date   TSH 0.73 06/22/2020   Lab Results  Component Value Date   WBC 7.3 06/22/2020   HGB 14.1 06/22/2020   HCT 42.4 06/22/2020   MCV 88.4 06/22/2020   PLT 335.0 06/22/2020   Lab Results  Component Value Date   CREATININE 0.73 06/22/2020   BUN 15 06/22/2020   NA 137 06/22/2020   K 4.0 06/22/2020   CL 102 06/22/2020   CO2 27 06/22/2020   Lab Results  Component Value Date   ALT 15 12/15/2019   AST 17 12/15/2019   ALKPHOS 51 12/15/2019   BILITOT 0.4 12/15/2019   Lab Results  Component Value Date   CHOL 224 (H) 12/15/2019   Lab Results  Component Value Date   HDL 80.90 12/15/2019   Lab Results  Component Value Date   LDLCALC 116 (H) 12/15/2019   Lab Results  Component Value Date   TRIG 138.0 12/15/2019   Lab Results  Component Value Date   CHOLHDL 3 12/15/2019   Lab Results  Component Value Date   HGBA1C 6.6 (H) 06/22/2020   IMPRESSION AND PLAN:  No problem-specific Assessment & Plan notes found for this encounter.  Poc a1c?  ?TFTs ? Urine microalb/cr  An After Visit Summary was printed and given to the patient.  FOLLOW UP: No follow-ups on file. Fasting cpe 3 mo  Signed:  Crissie Sickles, MD           01/12/2021

## 2021-01-17 ENCOUNTER — Other Ambulatory Visit: Payer: Self-pay | Admitting: Family Medicine

## 2021-01-19 ENCOUNTER — Telehealth: Payer: Self-pay | Admitting: Family Medicine

## 2021-01-19 MED ORDER — DULOXETINE HCL 60 MG PO CPEP: ORAL_CAPSULE | ORAL | 0 refills | Status: DC

## 2021-01-19 NOTE — Telephone Encounter (Signed)
[facility]

## 2021-01-19 NOTE — Telephone Encounter (Signed)
Patient requesting refill of duloxetine. She has an appt scheduled for one week today. Please send to same pharmacy.

## 2021-01-26 ENCOUNTER — Other Ambulatory Visit: Payer: Self-pay

## 2021-01-26 ENCOUNTER — Ambulatory Visit (INDEPENDENT_AMBULATORY_CARE_PROVIDER_SITE_OTHER): Payer: Commercial Managed Care - PPO | Admitting: Family Medicine

## 2021-01-26 ENCOUNTER — Encounter: Payer: Self-pay | Admitting: Family Medicine

## 2021-01-26 VITALS — BP 128/82 | HR 93 | Temp 98.2°F | Resp 16 | Ht 63.0 in | Wt 142.0 lb

## 2021-01-26 DIAGNOSIS — F32A Depression, unspecified: Secondary | ICD-10-CM

## 2021-01-26 DIAGNOSIS — F419 Anxiety disorder, unspecified: Secondary | ICD-10-CM | POA: Diagnosis not present

## 2021-01-26 DIAGNOSIS — E119 Type 2 diabetes mellitus without complications: Secondary | ICD-10-CM | POA: Diagnosis not present

## 2021-01-26 MED ORDER — ZOLPIDEM TARTRATE 10 MG PO TABS: 10.0000 mg | ORAL_TABLET | Freq: Every evening | ORAL | 5 refills | Status: DC | PRN

## 2021-01-26 MED ORDER — ALPRAZOLAM 0.5 MG PO TABS: ORAL_TABLET | ORAL | 5 refills | Status: DC

## 2021-01-26 MED ORDER — DULOXETINE HCL 60 MG PO CPEP: ORAL_CAPSULE | ORAL | 3 refills | Status: DC

## 2021-01-26 NOTE — Progress Notes (Signed)
OFFICE VISIT  01/26/2021  CC:  Chief Complaint  Patient presents with   Follow-up    RCI   HPI:    Patient is a 55 y.o. Caucasian female who presents for f/u depression and anxiety as well as recent new dx DM. I last saw her about 5 mo ago. A/P as of that visit: "1) DM 2, recent dx. Discussed dx, ongoing diet/exercise recommendations, routine monitoring, etc. No meds at this time.  She is quite apprehensive b/c her mom had dm and required insulin. Plan recheck a1c in 3 mo.  Will get urine microalb/cr at that time.   2) Thyromegaly/multinodular goiter. Ultrasound reviewed again--no nodules fit criteria for bx, one fit criteria for surveillance imaging. LABS: Euthyroid, neg Ab's, neg inflamm markers --all 6 wks ago. She has her initial appt with Dr. Cruzita Lederer for eval next week."  INTERIM HX: Doing fine.  No acute complaints. Her gyn rx'd her ozempic to tx her DM and assist with wt loss but pt awaiting ins approval.  She is now exercising regularly with paddle boarding. She is enjoying her new home at [facility]. She will continue seeing her [place] area medical providers, though.  Says mood is good and anxiety level stable/well controlled on cymbalta 60 qd. Initiates sleep well with ambien qhs. Takes alpraz 0.5 usually qd.  PMP AWARE reviewed today: most recent rx for ambien 10 was filled 12/28/20, # 30, rx by me Most recent alpraz 0.'5mg'$  rx filled 12/23/20, #60, rx by me. No red flags.   Past Medical History:  Diagnosis Date   Allergy    seasonal   Depression    Dysmenorrhea    Dyspepsia 08/2017   vs gastritis.  MUCH improved with PPI + getting anxiety controlled.   Fatty liver 08/2017   u/s   Fibroid uterus 08/2016   U/s at GYN   GAD (generalized anxiety disorder)    Gestational diabetes    History of adenomatous polyp of colon 07/2016   Recall 5 yrs (Dr. Havery Moros)   HSV infection    IBS (irritable bowel syndrome)    Insomnia    Menopausal symptoms    GYN  started prometrium and vivelle 08/2016.   Menorrhagia    + dysmenorrhea and hx of fibroids: GYN eval 07/2017->FSH normal, saline infusion u/s showed normal uterus/endomet + R ovarian cystic nodule suspected to be hemorrhagic cyst vs endometrioma.  CA 125 level normal.  Pt offered Mirena or hysterectomy and she declined both.   Multinodular thyroid 06/2020   one cystic/nodule lesion requiring 1 yr f/u ultrasound.  Euthyroid as of 06/2020.   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 IBS.  GYN MD started Bentyl trial 08/2016.   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  +Diverticulosis L colon.   cyst removed from back of right leg  12/2010   ENDOMETRIAL ABLATION  2010   HER OPTION ABLATION FOR MENORRHAGIA   EYE SURGERY  age 73   right eye for strabismus    Outpatient Medications Prior to Visit  Medication Sig Dispense Refill   ESTROGEL 0.75 MG/1.25 GM (0.06%) topical gel SMARTSIG:Topical     omeprazole (PRILOSEC) 40 MG capsule TAKE 1 CAPSULE BY MOUTH EVERY DAY 90 capsule 3   progesterone (PROMETRIUM) 100 MG capsule Take 100 mg by mouth at bedtime.     testosterone (ANDROGEL) 50 MG/5GM (1%) GEL Place onto the skin daily.     ALPRAZolam (XANAX) 0.5 MG tablet TAKE 1 TABLET BY MOUTH 3 TIMES A DAY AS NEEDED FOR SEVERE ANXIETY 60 tablet 5   zolpidem (AMBIEN) 10 MG tablet Take 1 tablet (10 mg total) by mouth at bedtime as needed. 30 tablet 5   Semaglutide,0.25 or 0.'5MG'$ /DOS, (OZEMPIC, 0.25 OR 0.5 MG/DOSE,) 2 MG/1.5ML SOPN Ozempic 0.25 mg or 0.5 mg (2 mg/1.5 mL) subcutaneous pen injector  Inject 0.25 mg every week by subcutaneous  route. (Patient not taking: Reported on 01/26/2021)     DULoxetine (CYMBALTA) 60 MG capsule TAKE 1 CAPSULE BY MOUTH EVERY DAY 14 capsule 0   No facility-administered medications prior to visit.    No Known Allergies  ROS As per HPI  PE: Vitals with BMI 01/26/2021 08/17/2020 08/13/2020  Height '5\' 3"'$  5' 3.5" '5\' 3"'$   Weight 142 lbs 148 lbs 10 oz 146 lbs 6 oz  BMI 25.16 [id number]  Systolic 0000000 [id number]  Diastolic 82 80 89  Pulse 93 104 91   Gen: Alert, well appearing.  Patient is oriented to person, place, time, and situation. AFFECT: pleasant, lucid thought and speech. No further exam todayds  LABS:  Lab Results  Component Value Date   TSH 0.73 06/22/2020   Lab Results  Component Value Date   WBC 7.3 06/22/2020   HGB 14.1 06/22/2020   HCT 42.4 06/22/2020   MCV 88.4 06/22/2020   PLT 335.0 06/22/2020   Lab Results  Component Value Date   CREATININE 0.73 06/22/2020   BUN 15 06/22/2020   NA 137 06/22/2020   K 4.0 06/22/2020   CL 102 06/22/2020   CO2 27 06/22/2020   Lab Results  Component Value Date   ALT 15 12/15/2019   AST 17 12/15/2019   ALKPHOS 51 12/15/2019   BILITOT 0.4 12/15/2019   Lab Results  Component Value Date   CHOL 224 (H) 12/15/2019   Lab Results  Component Value Date   HDL 80.90 12/15/2019   Lab Results  Component Value Date   LDLCALC 116 (H) 12/15/2019   Lab Results  Component Value Date   TRIG 138.0 12/15/2019   Lab Results  Component Value Date   CHOLHDL 3 12/15/2019   Lab Results  Component Value Date   HGBA1C 6.6 (H) 06/22/2020    IMPRESSION AND PLAN:  1) Hx of depression, in remission. GAD well controlled. Cont cymbalta 60 qd and alprazolam 0.'5mg'$  tid prn. CSC and UDS UTD. New alpraz (#30, RF x 5) and cymbalta rx's sent to her pharmacy in [place], [place].  2) Insomnia, doing well on ambien 10 qhs. CSC and UDS UTD. New rx, #30 with RF x 5 sent to pharm in [place], [place].  3) DM 2, dx'd 06/2020, a1c 6.6% at that time. Her diet and  exercise habits have improved. Rpt (venous) hba1c today. Her GYN MD rx'd her ozempic for wt loss and pt is awaiting insurance approval.  An After Visit Summary was printed and given to the patient.  FOLLOW UP: Return in about 6 months (around 07/29/2021) for annual CPE (fasting).  Signed:  Crissie Sickles, MD  01/26/2021  

## 2021-01-27 LAB — HEMOGLOBIN A1C: Hgb A1c MFr Bld: 6.5 % (ref 4.6–6.5)

## 2021-02-26 IMAGING — US US THYROID
1 series · 13 of 25 positions shown · non-contrast
Comparison: None.

CLINICAL DATA: Palpable abnormality. Thyromegaly on physical
examination.

EXAM:
THYROID ULTRASOUND
TECHNIQUE: Ultrasound examination of the thyroid gland and adjacent soft
tissues was performed.

[Series 1: us thyroid · 13 of 45 slices shown]
[im 1/45]
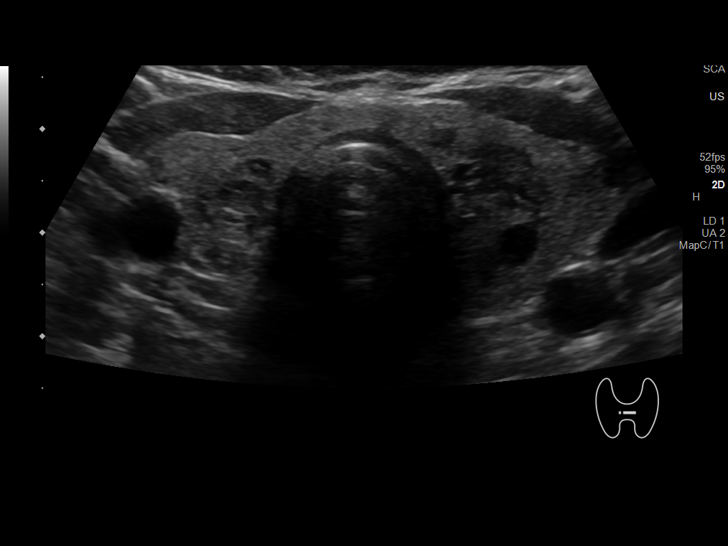
[im 4/45]
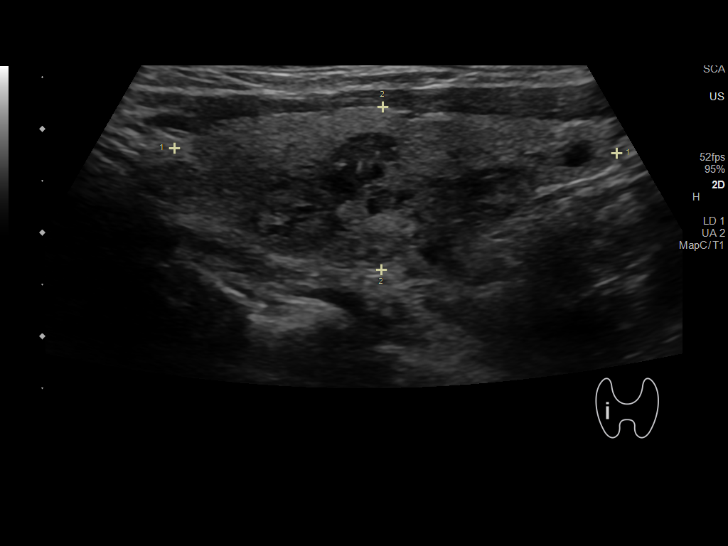
[im 8/45]
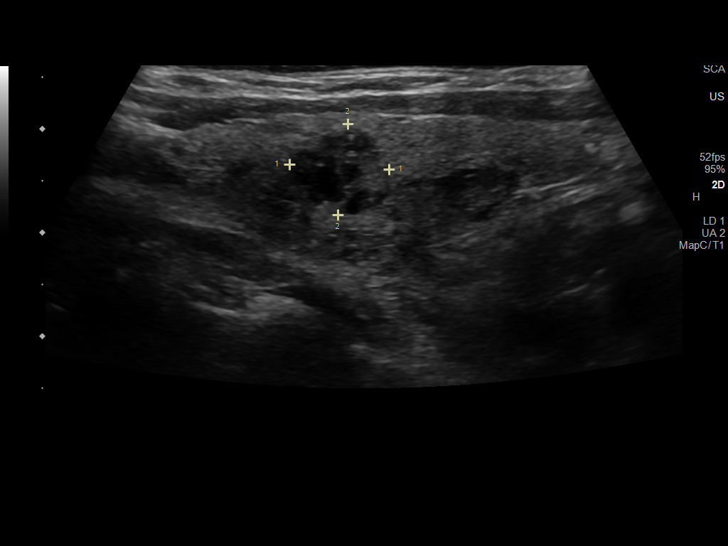
[im 12/45]
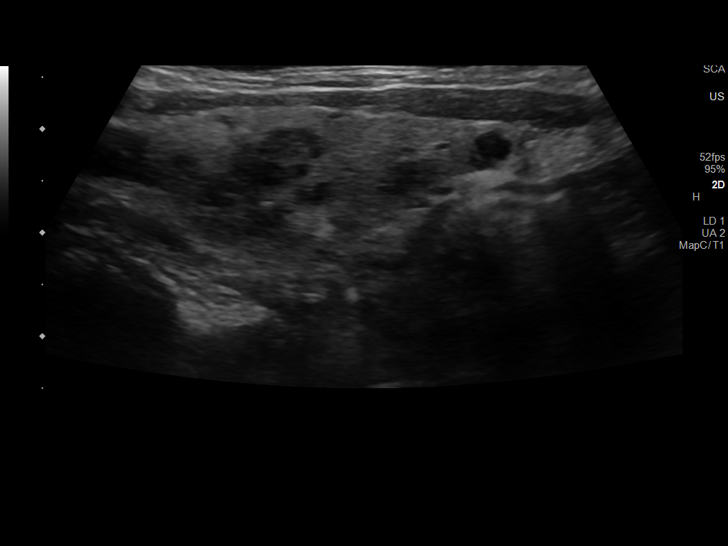
[im 15/45]
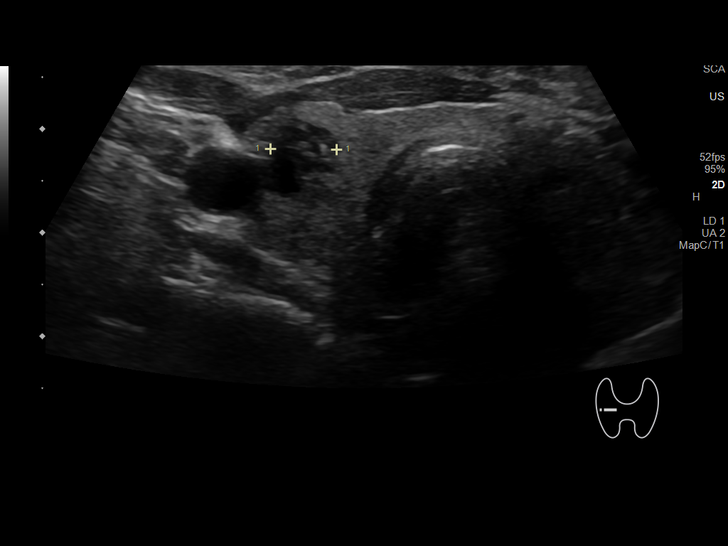
[im 19/45]
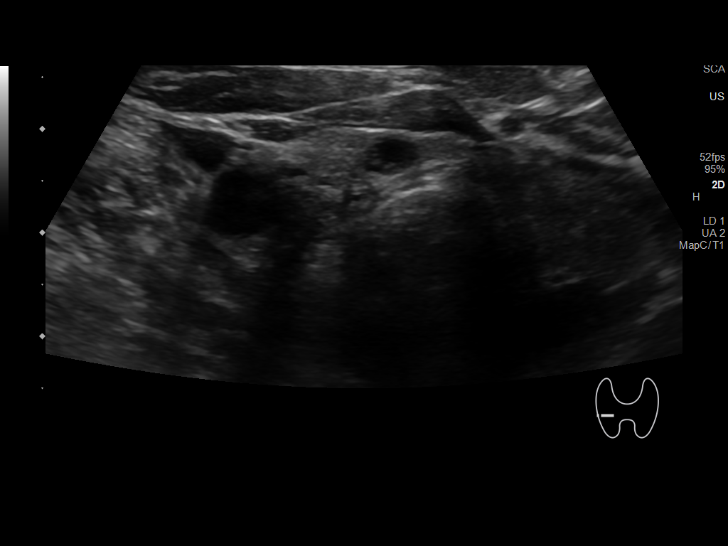
[im 23/45]
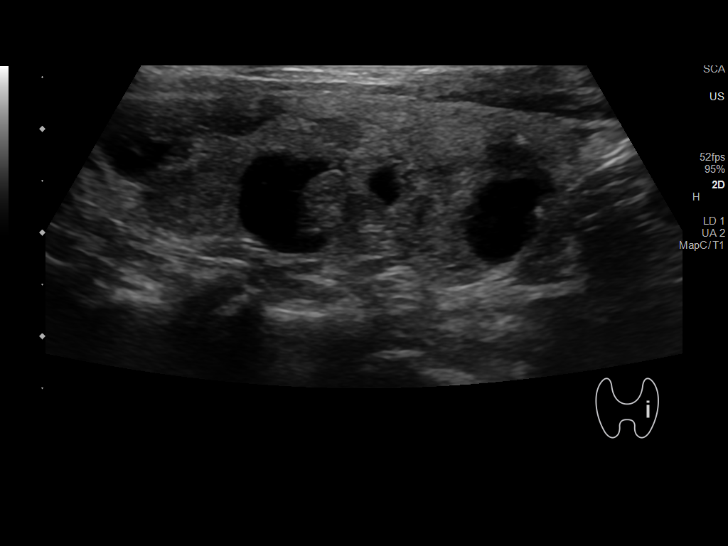
[im 26/45]
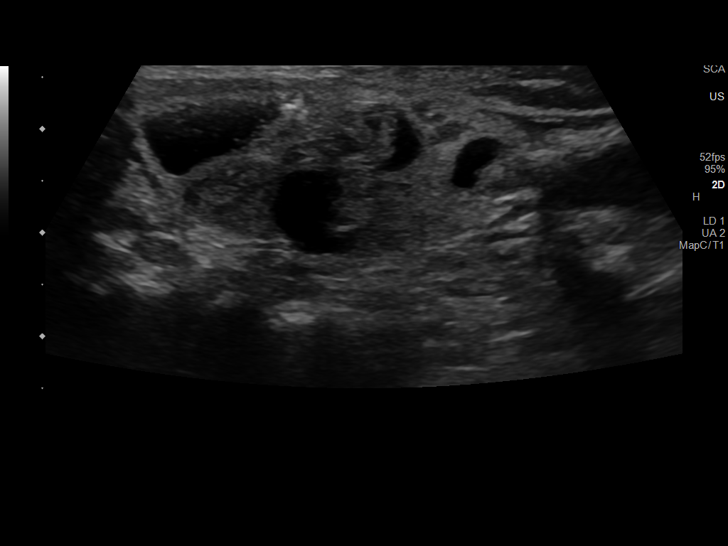
[im 30/45]
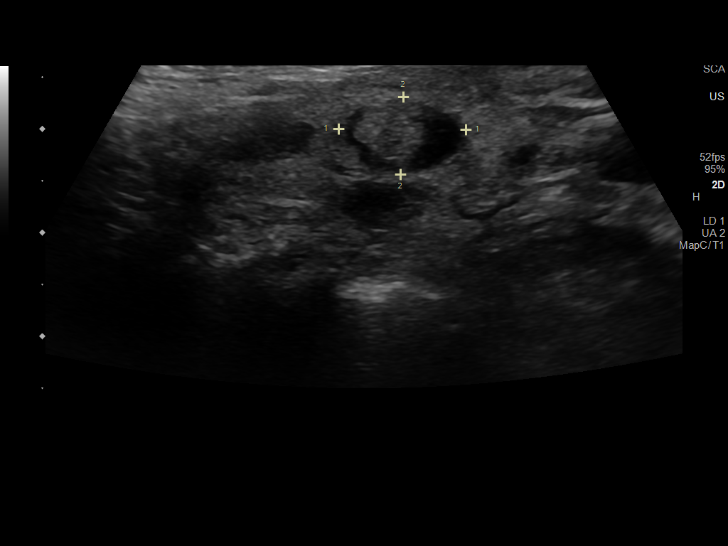
[im 34/45]
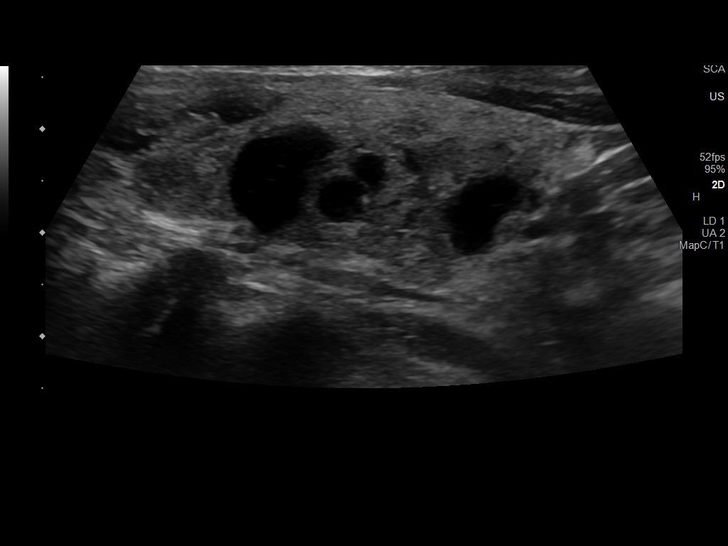
[im 37/45]
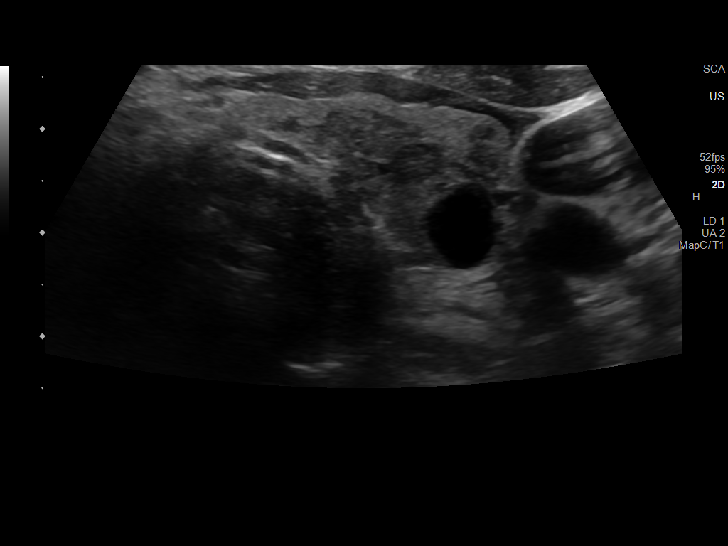
[im 41/45]
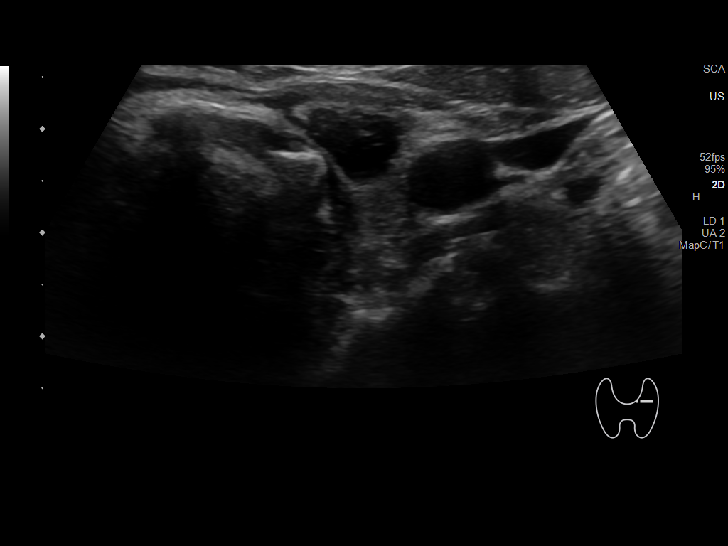
[im 45/45]
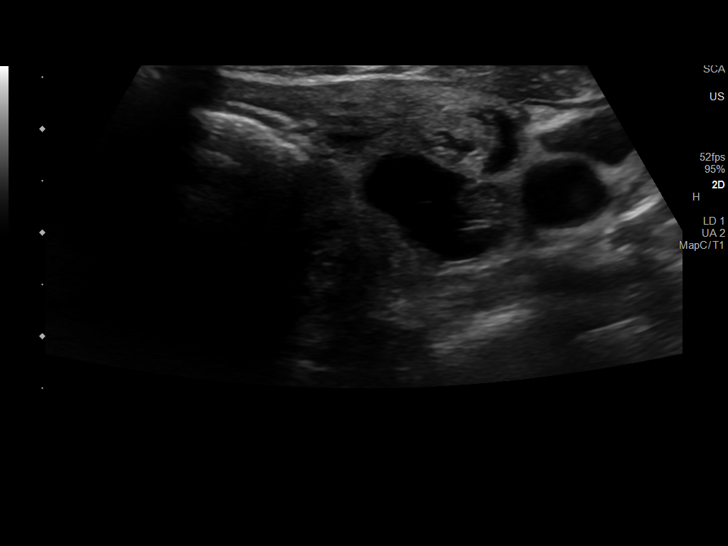

[13 of 25 positions shown; findings below may reference images not displayed]

FINDINGS: Parenchymal Echotexture: Moderately heterogenous

Isthmus: Normal in size measuring 0.3 cm in diameter

Right lobe: Normal in size measuring 4.3 x 1.6 x 1.1 cm

Left lobe: Normal in size measuring 4.8 x 1.8 x 1.7 cm

_________________________________________________________

Estimated total number of nodules >/= 1 cm: 6-10

Number of spongiform nodules >/=  2 cm not described below (TR1): 0

Number of mixed cystic and solid nodules >/= 1.5 cm not described
below (TR2): 0

_________________________________________________________

Scattered punctate (1 cm) right-sided spongiform/benign-appearing
right-sided thyroid nodules (labeled #1 through #3), none of which
meet imaging criteria to recommend percutaneous sampling or
continued dedicated follow-up with nodules labeled #1 and #2 both
containing benign colloid.

_________________________________________________________

There is an approximately 1.4 cm minimally complex cyst within the
superior pole the left lobe of the thyroid labeled 4), which does
not meet criteria to recommend percutaneous sampling or continued
dedicated follow-up.

There is an approximately 1.2 cm mixed cystic and solid nodule
within the mid aspect the left lobe of the thyroid (labeled 5),
which does not meet criteria to recommend percutaneous sampling or
continued dedicated follow-up

There is an approximately 1.6 cm partially solid though
predominantly cystic nodule within mid aspect the left lobe of the
thyroid (labeled 6), which does not meet criteria to recommend
percutaneous sampling or continued dedicated follow-up.

_________________________________________________________

Nodule # 7:

Location: Left; Inferior

Maximum size: 2.2 cm; Other 2 dimensions: 1.5 x 1.3 cm

Composition: solid/almost completely solid (2)

Echogenicity: isoechoic (1)

Shape: not taller-than-wide (0)

Margins: ill-defined (0)

Echogenic foci: none (0)

ACR TI-RADS total points: 3.

ACR TI-RADS risk category: TR3 (3 points).

ACR TI-RADS recommendations:

*Given size (>/= 1.5 - 2.4 cm) and appearance, a follow-up
ultrasound in 1 year should be considered based on TI-RADS criteria.

_________________________________________________________
IMPRESSION: 1. Findings suggestive of multinodular goiter.
2. Nodule #7 meets imaging criteria to recommend a 1 year follow-up
as indicated.
3. None of the remaining thyroid nodules meet imaging criteria to
recommend percutaneous sampling or continued dedicated follow-up.

The above is in keeping with the ACR TI-RADS recommendations - [HOSPITAL] 0266;[DATE].

## 2021-04-21 ENCOUNTER — Ambulatory Visit: Payer: Commercial Managed Care - PPO | Admitting: Internal Medicine

## 2021-04-28 ENCOUNTER — Other Ambulatory Visit: Payer: Self-pay

## 2021-04-28 ENCOUNTER — Encounter: Payer: Commercial Managed Care - PPO | Admitting: Family Medicine

## 2021-04-28 NOTE — Progress Notes (Deleted)
Virtual Visit via Video Note  I connected with Karen Oconnell on 04/28/21 at  8:30 AM EDT by a video enabled telemedicine application and verified that I am speaking with the correct person using two identifiers.  Location patient: home, [place] Location provider:work or home office Persons participating in the virtual visit: patient, provider  I discussed the limitations of evaluation and management by telemedicine and the availability of in person appointments. The patient expressed understanding and agreed to proceed.  Telemedicine visit is a necessity given the COVID-19 restrictions in place at the current time.  HPI: 55 y/o WF being seen today for resp illness-->covid positive home test.  ROS: See pertinent positives and negatives per HPI.  Past Medical History:  Diagnosis Date   Allergy    seasonal   Depression    Dysmenorrhea    Dyspepsia 08/2017   vs gastritis.  MUCH improved with PPI + getting anxiety controlled.   Fatty liver 08/2017   u/s   Fibroid uterus 08/2016   U/s at GYN   GAD (generalized anxiety disorder)    Gestational diabetes    History of adenomatous polyp of colon 07/2016   Recall 5 yrs (Dr. Havery Moros)   HSV infection    IBS (irritable bowel syndrome)    Insomnia    Menopausal symptoms    GYN started prometrium and vivelle 08/2016.   Menorrhagia    + dysmenorrhea and hx of fibroids: GYN eval 07/2017->FSH normal, saline infusion u/s showed normal uterus/endomet + R ovarian cystic nodule suspected to be hemorrhagic cyst vs endometrioma.  CA 125 level normal.  Pt offered Mirena or hysterectomy and she declined both.   Multinodular thyroid 06/2020   one cystic/nodule lesion requiring 1 yr f/u ultrasound.  Euthyroid as of 06/2020.   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 IBS.  GYN MD started Bentyl trial 08/2016.   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  +Diverticulosis L colon.   cyst removed from back of right leg  12/2010   ENDOMETRIAL ABLATION  2010   HER OPTION ABLATION FOR MENORRHAGIA   EYE SURGERY  age 44   right eye for strabismus     Current Outpatient Medications:    ALPRAZolam (XANAX) 0.5 MG tablet, TAKE 1 TABLET BY MOUTH 3 TIMES A DAY AS NEEDED FOR SEVERE ANXIETY, Disp: 60 tablet, Rfl: 5   DULoxetine (CYMBALTA) 60 MG capsule, TAKE 1 CAPSULE BY MOUTH EVERY DAY, Disp: 90 capsule, Rfl: 3   ESTROGEL 0.75 MG/1.25 GM (0.06%) topical gel, SMARTSIG:Topical, Disp: , Rfl:    omeprazole (PRILOSEC) 40 MG capsule, TAKE 1 CAPSULE BY MOUTH EVERY DAY, Disp: 90 capsule, Rfl: 3   progesterone (PROMETRIUM) 100 MG capsule, Take 100 mg by mouth at bedtime., Disp: , Rfl:    Semaglutide,0.25 or 0.5MG /DOS, (OZEMPIC, 0.25 OR 0.5 MG/DOSE,) 2 MG/1.5ML SOPN, Ozempic 0.25 mg or 0.5 mg (2 mg/1.5 mL) subcutaneous pen injector  Inject 0.25 mg every week by subcutaneous route. (Patient not taking: Reported on 01/26/2021), Disp: , Rfl:    testosterone (ANDROGEL) 50  MG/5GM (1%) GEL, Place onto the skin daily., Disp: , Rfl:    zolpidem (AMBIEN) 10 MG tablet, Take 1 tablet (10 mg total) by mouth at bedtime as needed., Disp: 30 tablet, Rfl: 5  EXAM:  VITALS per patient if applicable:  Vitals with BMI 01/26/2021 08/17/2020 08/13/2020  Height 5\' 3"  5' 3.5" 5\' 3"   Weight 142 lbs 148 lbs 10 oz 146 lbs 6 oz  BMI 25.16 86.75 44.92  Systolic 010 071 219  Diastolic 82 80 89  Pulse 93 104 91     GENERAL: alert, oriented, appears well and in no acute distress  HEENT: atraumatic, conjunttiva clear, no obvious abnormalities on inspection of external nose and ears  NECK: normal movements of the head and  neck  LUNGS: on inspection no signs of respiratory distress, breathing rate appears normal, no obvious gross SOB, gasping or wheezing  CV: no obvious cyanosis  MS: moves all visible extremities without noticeable abnormality  PSYCH/NEURO: pleasant and cooperative, no obvious depression or anxiety, speech and thought processing grossly intact  LABS: none today    Chemistry      Component Value Date/Time   NA 137 06/22/2020 1122   K 4.0 06/22/2020 1122   CL 102 06/22/2020 1122   CO2 27 06/22/2020 1122   BUN 15 06/22/2020 1122   CREATININE 0.73 06/22/2020 1122   CREATININE 0.67 07/06/2017 1548      Component Value Date/Time   CALCIUM 9.8 06/22/2020 1122   ALKPHOS 51 12/15/2019 0913   AST 17 12/15/2019 0913   ALT 15 12/15/2019 0913   BILITOT 0.4 12/15/2019 0913      ASSESSMENT AND PLAN:  Discussed the following assessment and plan:  No diagnosis found.  Age 55  GFR >90.  I discussed the assessment and treatment plan with the patient. The patient was provided an opportunity to ask questions and all were answered. The patient agreed with the plan and demonstrated an understanding of the instructions.   F/u: ***  Signed:  Crissie Sickles, MD           04/28/2021

## 2021-07-31 ENCOUNTER — Other Ambulatory Visit: Payer: Self-pay | Admitting: Family Medicine

## 2021-08-02 NOTE — Telephone Encounter (Signed)
Requesting: zolpidem Contract: 06/22/20 UDS: 06/22/20 Last Visit: 01/26/21 Next Visit: advised to f/u 6 months Last Refill: 01/26/21(30,5)  Please Advise. Medication pending

## 2021-08-03 NOTE — Telephone Encounter (Signed)
1 month supply sent in. Patient due for follow-up

## 2021-08-05 NOTE — Telephone Encounter (Signed)
Pt advised 30 d/s sent. Appt scheduled

## 2021-08-17 ENCOUNTER — Ambulatory Visit (INDEPENDENT_AMBULATORY_CARE_PROVIDER_SITE_OTHER): Payer: Commercial Managed Care - PPO | Admitting: Family Medicine

## 2021-08-17 ENCOUNTER — Other Ambulatory Visit: Payer: Self-pay

## 2021-08-17 ENCOUNTER — Encounter: Payer: Self-pay | Admitting: Family Medicine

## 2021-08-17 VITALS — BP 127/83 | HR 96 | Temp 97.9°F | Ht 63.0 in | Wt 137.6 lb

## 2021-08-17 DIAGNOSIS — Z79899 Other long term (current) drug therapy: Secondary | ICD-10-CM

## 2021-08-17 DIAGNOSIS — F5104 Psychophysiologic insomnia: Secondary | ICD-10-CM | POA: Diagnosis not present

## 2021-08-17 DIAGNOSIS — R058 Other specified cough: Secondary | ICD-10-CM

## 2021-08-17 DIAGNOSIS — Z0000 Encounter for general adult medical examination without abnormal findings: Secondary | ICD-10-CM

## 2021-08-17 DIAGNOSIS — E119 Type 2 diabetes mellitus without complications: Secondary | ICD-10-CM

## 2021-08-17 DIAGNOSIS — Z23 Encounter for immunization: Secondary | ICD-10-CM | POA: Diagnosis not present

## 2021-08-17 DIAGNOSIS — E042 Nontoxic multinodular goiter: Secondary | ICD-10-CM

## 2021-08-17 DIAGNOSIS — F411 Generalized anxiety disorder: Secondary | ICD-10-CM

## 2021-08-17 DIAGNOSIS — R1310 Dysphagia, unspecified: Secondary | ICD-10-CM

## 2021-08-17 MED ORDER — ZOLPIDEM TARTRATE 10 MG PO TABS: 10.0000 mg | ORAL_TABLET | Freq: Every evening | ORAL | 1 refills | Status: DC | PRN

## 2021-08-17 MED ORDER — ALPRAZOLAM 0.5 MG PO TABS: ORAL_TABLET | ORAL | 5 refills | Status: DC

## 2021-08-17 NOTE — Progress Notes (Signed)
Office Note 08/17/2021  CC:  Chief Complaint  Patient presents with   Follow-up    RCI; pt is not fasting    Patient is a 56 y.o. female who is here for annual health maintenance exam and 6 mo f/u GAD/depression/insomnia (high risk med use) and DM 2. A/P as of last visit: "1) Hx of depression, in remission. GAD well controlled. Cont cymbalta 60 qd and alprazolam 0.5mg  tid prn. CSC and UDS UTD. New alpraz (#30, RF x 5) and cymbalta rx's sent to her pharmacy in [place], [place].   2) Insomnia, doing well on ambien 10 qhs. CSC and UDS UTD. New rx, #30 with RF x 5 sent to pharm in [place], [place].   3) DM 2, dx'd 06/2020, a1c 6.6% at that time. Her diet and exercise habits have improved. Rpt (venous) hba1c today. Her GYN MD rx'd her ozempic for wt loss and pt is awaiting insurance approval."  INTERIM HX: Feeling well other than having 2 wk hx of more consistent feeling of pressure/fullness over anterior neck, dry cough, swallowing discomfort.  No neck pain. No fevers, no signif prob with fatigue, cold intol, heat intol, etc.  No home blood glucose monitoring  States anxiety level and mood have been good.  She takes Ambien every night or else she does not sleep much at all. PMP AWARE reviewed today: most recent rx for Lorrin Mais was filled 08/03/21, # 30, rx by me. Most recent alprazolam rx filled 06/12/21, #60, rx by me. No red flags.  Past Medical History:  Diagnosis Date   Allergy    seasonal   Depression    Dysmenorrhea    Dyspepsia 08/2017   vs gastritis.  MUCH improved with PPI + getting anxiety controlled.   Fatty liver 08/2017   u/s   Fibroid uterus 08/2016   U/s at GYN   GAD (generalized anxiety disorder)    Gestational diabetes    History of adenomatous polyp of colon 07/2016   Recall 5 yrs (Dr. Havery Moros)   HSV infection    IBS (irritable bowel syndrome)    Insomnia    Menopausal symptoms    GYN started prometrium and vivelle 08/2016.   Menorrhagia    + dysmenorrhea  and hx of fibroids: GYN eval 07/2017->FSH normal, saline infusion u/s showed normal uterus/endomet + R ovarian cystic nodule suspected to be hemorrhagic cyst vs endometrioma.  CA 125 level normal.  Pt offered Mirena or hysterectomy and she declined both.   Multinodular thyroid 06/2020   one cystic/nodule lesion requiring 1 yr f/u ultrasound.  Euthyroid as of 06/2020.   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 IBS.  GYN MD started Bentyl trial 08/2016.   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  +Diverticulosis L colon.   cyst removed from back of right leg  12/2010   ENDOMETRIAL ABLATION  2010   HER OPTION ABLATION FOR MENORRHAGIA   EYE SURGERY  age 24   right eye for strabismus    Family History  Problem Relation Age of Onset  Diabetes Mother        Type 2   Hypertension Mother    Hyperlipidemia Mother    Depression Mother    Heart disease Mother    Seasonal affective disorder Father    Cancer Father        MELANOMA   Cancer Maternal Grandmother        gyn possibly cervical or uterine   Diabetes Maternal Grandfather        Type 2   Hypertension Maternal Grandfather    Depression Maternal Grandfather    Alcohol abuse Paternal Grandfather    Bipolar disorder Brother    Drug abuse Brother        cocaine, vicodin, pain killers   ADD / ADHD Son    Depression Son    Colon cancer Neg Hx    Esophageal cancer Neg Hx    Rectal cancer Neg Hx    Stomach cancer Neg Hx    Breast cancer Neg Hx     Social History   Socioeconomic History   Marital status: Married    Spouse name: Not on file   Number of  children: 2   Years of education: Not on file   Highest education level: Not on file  Occupational History   Occupation: Pharmacist, hospital  Tobacco Use   Smoking status: Never   Smokeless tobacco: Never  Vaping Use   Vaping Use: Never used  Substance and Sexual Activity   Alcohol use: Yes    Alcohol/week: 0.0 standard drinks    Comment: occasionaly   Drug use: No   Sexual activity: Yes    Partners: Male    Birth control/protection: Other-see comments, Surgical    Comment: husband vasectomy  Other Topics Concern   Not on file  Social History Narrative   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   No T/A/Ds.   Social Determinants of Health   Financial Resource Strain: Not on file  Food Insecurity: Not on file  Transportation Needs: Not on file  Physical Activity: Not on file  Stress: Not on file  Social Connections: Not on file  Intimate Partner Violence: Not on file    Outpatient Medications Prior to Visit  Medication Sig Dispense Refill   DULoxetine (CYMBALTA) 60 MG capsule TAKE 1 CAPSULE BY MOUTH EVERY DAY 90 capsule 3   ESTROGEL 0.75 MG/1.25 GM (0.06%) topical gel SMARTSIG:Topical     omeprazole (PRILOSEC) 40 MG capsule TAKE 1 CAPSULE BY MOUTH EVERY DAY 90 capsule 3   progesterone (PROMETRIUM) 100 MG capsule Take 100 mg by mouth at bedtime.     testosterone (ANDROGEL) 50 MG/5GM (1%) GEL Place onto the skin daily.     ALPRAZolam (XANAX) 0.5 MG tablet TAKE 1 TABLET BY MOUTH 3 TIMES A DAY AS NEEDED FOR SEVERE ANXIETY 60 tablet 5   zolpidem (AMBIEN) 10 MG tablet TAKE 1 TABLET BY MOUTH AT BEDTIME AS NEEDED. 30 tablet 0   Semaglutide,0.25 or 0.5MG /DOS, (OZEMPIC, 0.25 OR 0.5 MG/DOSE,) 2 MG/1.5ML SOPN Ozempic 0.25 mg or 0.5 mg (2 mg/1.5 mL) subcutaneous pen injector  Inject 0.25 mg every week by subcutaneous route. (Patient not taking: Reported on 01/26/2021)     No facility-administered medications prior to visit.    No Known Allergies  ROS Review of Systems   Constitutional:  Negative for appetite change, chills, fatigue and fever.  HENT:  Negative for congestion, dental problem, ear pain and sore throat.   Eyes:  Negative for discharge, redness  and visual disturbance.  Respiratory:  Positive for cough. Negative for chest tightness, shortness of breath and wheezing.   Cardiovascular:  Negative for chest pain, palpitations and leg swelling.  Gastrointestinal:  Negative for abdominal pain, blood in stool, diarrhea, nausea and vomiting.  Genitourinary:  Negative for difficulty urinating, dysuria, flank pain, frequency, hematuria and urgency.  Musculoskeletal:  Negative for arthralgias, back pain, joint swelling, myalgias and neck stiffness.  Skin:  Negative for pallor and rash.  Neurological:  Negative for dizziness, speech difficulty, weakness and headaches.  Hematological:  Negative for adenopathy. Does not bruise/bleed easily.  Psychiatric/Behavioral:  Negative for confusion and sleep disturbance. The patient is not nervous/anxious.    PE; Vitals with BMI 08/17/2021 01/26/2021 08/17/2020  Height 5\' 3"  5\' 3"  5' 3.5"  Weight 137 lbs 10 oz 142 lbs 148 lbs 10 oz  BMI 24.38 70.35 00.93  Systolic 818 299 371  Diastolic 83 82 80  Pulse 96 93 104   Exam chaperoned by Deveron Furlong, CMA.  Gen: Alert, well appearing.  Patient is oriented to person, place, time, and situation. AFFECT: pleasant, lucid thought and speech. ENT: Ears: EACs clear, normal epithelium.  TMs with good light reflex and landmarks bilaterally.  Eyes: no injection, icteris, swelling, or exudate.  EOMI, PERRLA. Nose: no drainage or turbinate edema/swelling.  No injection or focal lesion.  Mouth: lips without lesion/swelling.  Oral mucosa pink and moist.  Dentition intact and without obvious caries or gingival swelling.  Oropharynx without erythema, exudate, or swelling.  Neck: supple/nontender.  No LAD, mass, or TM.  Carotid pulses 2+ bilaterally, without bruits. CV: RRR, no m/r/g.    LUNGS: CTA bilat, nonlabored resps, good aeration in all lung fields. ABD: soft, NT, ND, BS normal.  No hepatospenomegaly or mass.  No bruits. EXT: no clubbing, cyanosis, or edema.  Musculoskeletal: no joint swelling, erythema, warmth, or tenderness.  ROM of all joints intact. Skin - no sores or suspicious lesions or rashes or color changes  Pertinent labs:  Lab Results  Component Value Date   TSH 0.73 06/22/2020   Lab Results  Component Value Date   WBC 7.3 06/22/2020   HGB 14.1 06/22/2020   HCT 42.4 06/22/2020   MCV 88.4 06/22/2020   PLT 335.0 06/22/2020   Lab Results  Component Value Date   CREATININE 0.73 06/22/2020   BUN 15 06/22/2020   NA 137 06/22/2020   K 4.0 06/22/2020   CL 102 06/22/2020   CO2 27 06/22/2020   Lab Results  Component Value Date   ALT 15 12/15/2019   AST 17 12/15/2019   ALKPHOS 51 12/15/2019   BILITOT 0.4 12/15/2019   Lab Results  Component Value Date   CHOL 224 (H) 12/15/2019   Lab Results  Component Value Date   HDL 80.90 12/15/2019   Lab Results  Component Value Date   LDLCALC 116 (H) 12/15/2019   Lab Results  Component Value Date   TRIG 138.0 12/15/2019   Lab Results  Component Value Date   CHOLHDL 3 12/15/2019   Lab Results  Component Value Date   HGBA1C 6.5 01/26/2021   ASSESSMENT AND PLAN:   1) type 2 diabetes without complication.  Diet only--but admits she is not really following a diabetic diet and not exercising formally. Hemoglobin A1c, nonfasting glucose, and urine microalbumin/reatinine today.  #2 GAD, history of MDD, chronic insomnia. Stable on duloxetine 60 mg a day, alprazolam 0.5 mg 3 times daily as needed, and Ambien 10 mg at  night. Controlled substance contract updated.  Urine drug screen today. Did refills for alprazolam and Ambien today.  #3 multinodular goiter.  Euthyroid on past checks. Saw Dr. Cruzita Lederer 08/2020 and she recommended follow-up 1 year--patient has not arranged this yet--I gave her Dr.  Arman Filter phone number today to arrange this. She describes compressive symptoms lately.  We will go ahead and order her repeat thyroid ultrasound.  May need partial or complete thyroidectomy. Checking TSH today.  4) Health maintenance exam: Reviewed age and gender appropriate health maintenance issues (prudent diet, regular exercise, health risks of tobacco and excessive alcohol, use of seatbelts, fire alarms in home, use of sunscreen).  Also reviewed age and gender appropriate health screening as well as vaccine recommendations. Vaccines: flu->UTD.  Shingrix->#2 today. Labs: nonfasting HP today + Hba1c. Cervical ca screening: per GYN MD Breast ca screening: per GYN MD. Colon ca screening: recall 07/2021->I gave her GI phone # so she can call to arrange.  An After Visit Summary was printed and given to the patient.  FOLLOW UP:  Return in about 6 months (around 02/14/2022) for routine chronic illness f/u.  Signed:  Crissie Sickles, MD           08/17/2021

## 2021-08-17 NOTE — Patient Instructions (Signed)
Call [facility] to arrange colonoscopy:  [phone]  Call Dr. Arman Filter office [facility] endocrinology) for follow up appointment for your thyroid: [phone].  Health Maintenance, Female Adopting a healthy lifestyle and getting preventive care are important in promoting health and wellness. Ask your health care provider about: The right schedule for you to have regular tests and exams. Things you can do on your own to prevent diseases and keep yourself healthy. What should I know about diet, weight, and exercise? Eat a healthy diet  Eat a diet that includes plenty of vegetables, fruits, low-fat dairy products, and lean protein. Do not eat a lot of foods that are high in solid fats, added sugars, or sodium. Maintain a healthy weight Body mass index (BMI) is used to identify weight problems. It estimates body fat based on height and weight. Your health care provider can help determine your BMI and help you achieve or maintain a healthy weight. Get regular exercise Get regular exercise. This is one of the most important things you can do for your health. Most adults should: Exercise for at least 150 minutes each week. The exercise should increase your heart rate and make you sweat (moderate-intensity exercise). Do strengthening exercises at least twice a week. This is in addition to the moderate-intensity exercise. Spend less time sitting. Even light physical activity can be beneficial. Watch cholesterol and blood lipids Have your blood tested for lipids and cholesterol at 56 years of age, then have this test every 5 years. Have your cholesterol levels checked more often if: Your lipid or cholesterol levels are high. You are older than 56 years of age. You are at high risk for heart disease. What should I know about cancer screening? Depending on your health history and family history, you may need to have cancer screening at various ages. This may include screening  for: Breast cancer. Cervical cancer. Colorectal cancer. Skin cancer. Lung cancer. What should I know about heart disease, diabetes, and high blood pressure? Blood pressure and heart disease High blood pressure causes heart disease and increases the risk of stroke. This is more likely to develop in people who have high blood pressure readings or are overweight. Have your blood pressure checked: Every 3-5 years if you are 36-19 years of age. Every year if you are 71 years old or older. Diabetes Have regular diabetes screenings. This checks your fasting blood sugar level. Have the screening done: Once every three years after age 58 if you are at a normal weight and have a low risk for diabetes. More often and at a younger age if you are overweight or have a high risk for diabetes. What should I know about preventing infection? Hepatitis B If you have a higher risk for hepatitis B, you should be screened for this virus. Talk with your health care provider to find out if you are at risk for hepatitis B infection. Hepatitis C Testing is recommended for: Everyone born from [birth year] through 1965. Anyone with known risk factors for hepatitis C. Sexually transmitted infections (STIs) Get screened for STIs, including gonorrhea and chlamydia, if: You are sexually active and are younger than 56 years of age. You are older than 56 years of age and your health care provider tells you that you are at risk for this type of infection. Your sexual activity has changed since you were last screened, and you are at increased risk for chlamydia or gonorrhea. Ask your health care provider if you are at risk. Ask your health  care provider about whether you are at high risk for HIV. Your health care provider may recommend a prescription medicine to help prevent HIV infection. If you choose to take medicine to prevent HIV, you should first get tested for HIV. You should then be tested every 3 months for as long as you  are taking the medicine. Pregnancy If you are about to stop having your period (premenopausal) and you may become pregnant, seek counseling before you get pregnant. Take 400 to 800 micrograms (mcg) of folic acid every day if you become pregnant. Ask for birth control (contraception) if you want to prevent pregnancy. Osteoporosis and menopause Osteoporosis is a disease in which the bones lose minerals and strength with aging. This can result in bone fractures. If you are 16 years old or older, or if you are at risk for osteoporosis and fractures, ask your health care provider if you should: Be screened for bone loss. Take a calcium or vitamin D supplement to lower your risk of fractures. Be given hormone replacement therapy (HRT) to treat symptoms of menopause. Follow these instructions at home: Alcohol use Do not drink alcohol if: Your health care provider tells you not to drink. You are pregnant, may be pregnant, or are planning to become pregnant. If you drink alcohol: Limit how much you have to: 0-1 drink a day. Know how much alcohol is in your drink. In the [place] one drink equals one 12 oz bottle of beer (355 mL), one 5 oz glass of wine (148 mL), or one 1 oz glass of hard liquor (44 mL). Lifestyle Do not use any products that contain nicotine or tobacco. These products include cigarettes, chewing tobacco, and vaping devices, such as e-cigarettes. If you need help quitting, ask your health care provider. Do not use street drugs. Do not share needles. Ask your health care provider for help if you need support or information about quitting drugs. General instructions Schedule regular health, dental, and eye exams. Stay current with your vaccines. Tell your health care provider if: You often feel depressed. You have ever been abused or do not feel safe at home. Summary Adopting a healthy lifestyle and getting preventive care are important in promoting health and wellness. Follow your  health care provider's instructions about healthy diet, exercising, and getting tested or screened for diseases. Follow your health care provider's instructions on monitoring your cholesterol and blood pressure. This information is not intended to replace advice given to you by your health care provider. Make sure you discuss any questions you have with your health care provider. Document Revised: 11/08/2020 Document Reviewed: 11/08/2020 Elsevier Patient Education  [place].

## 2021-08-18 ENCOUNTER — Ambulatory Visit: Payer: Commercial Managed Care - PPO

## 2021-08-18 NOTE — Addendum Note (Signed)
Addended by: Deveron Furlong D on: 08/18/2021 03:55 PM   Modules accepted: Orders

## 2021-08-19 LAB — CBC WITH DIFFERENTIAL/PLATELET
Absolute Monocytes: 553 cells/uL (ref 200–950)
Basophils Absolute: 21 cells/uL (ref 0–200)
Basophils Relative: 0.3 %
Eosinophils Absolute: 147 cells/uL (ref 15–500)
Eosinophils Relative: 2.1 %
HCT: 42 % (ref 35.0–45.0)
Hemoglobin: 13.9 g/dL (ref 11.7–15.5)
Lymphs Abs: 2646 cells/uL (ref 850–3900)
MCH: 29.1 pg (ref 27.0–33.0)
MCHC: 33.1 g/dL (ref 32.0–36.0)
MCV: 88.1 fL (ref 80.0–100.0)
MPV: 10 fL (ref 7.5–12.5)
Monocytes Relative: 7.9 %
Neutro Abs: 3633 cells/uL (ref 1500–7800)
Neutrophils Relative %: 51.9 %
Platelets: 426 10*3/uL — ABNORMAL HIGH (ref 140–400)
RBC: 4.77 10*6/uL (ref 3.80–5.10)
RDW: 13 % (ref 11.0–15.0)
Total Lymphocyte: 37.8 %
WBC: 7 10*3/uL (ref 3.8–10.8)

## 2021-08-19 LAB — COMPREHENSIVE METABOLIC PANEL
AG Ratio: 1.8 (calc) (ref 1.0–2.5)
ALT: 20 U/L (ref 6–29)
AST: 16 U/L (ref 10–35)
Albumin: 4.3 g/dL (ref 3.6–5.1)
Alkaline phosphatase (APISO): 52 U/L (ref 37–153)
BUN/Creatinine Ratio: 13 (calc) (ref 6–22)
BUN: 13 mg/dL (ref 7–25)
CO2: 27 mmol/L (ref 20–32)
Calcium: 9.6 mg/dL (ref 8.6–10.4)
Chloride: 105 mmol/L (ref 98–110)
Creat: 1.04 mg/dL — ABNORMAL HIGH (ref 0.50–1.03)
Globulin: 2.4 g/dL (calc) (ref 1.9–3.7)
Glucose, Bld: 117 mg/dL — ABNORMAL HIGH (ref 65–99)
Potassium: 4.3 mmol/L (ref 3.5–5.3)
Sodium: 143 mmol/L (ref 135–146)
Total Bilirubin: 0.3 mg/dL (ref 0.2–1.2)
Total Protein: 6.7 g/dL (ref 6.1–8.1)

## 2021-08-19 LAB — DRUG MONITORING PANEL 376104, URINE
Alphahydroxyalprazolam: 152 ng/mL — ABNORMAL HIGH (ref <25)
Alphahydroxymidazolam: NEGATIVE ng/mL (ref <50)
Alphahydroxytriazolam: NEGATIVE ng/mL (ref <50)
Aminoclonazepam: NEGATIVE ng/mL (ref <25)
Amphetamines: NEGATIVE ng/mL (ref <500)
Barbiturates: NEGATIVE ng/mL (ref <300)
Benzodiazepines: POSITIVE ng/mL — AB (ref <100)
Cocaine Metabolite: NEGATIVE ng/mL (ref <150)
Codeine: NEGATIVE ng/mL (ref <50)
Desmethyltramadol: NEGATIVE ng/mL (ref <100)
Hydrocodone: NEGATIVE ng/mL (ref <50)
Hydromorphone: NEGATIVE ng/mL (ref <50)
Hydroxyethylflurazepam: NEGATIVE ng/mL (ref <50)
Lorazepam: NEGATIVE ng/mL (ref <50)
Morphine: NEGATIVE ng/mL (ref <50)
Nordiazepam: NEGATIVE ng/mL (ref <50)
Norhydrocodone: NEGATIVE ng/mL (ref <50)
Opiates: NEGATIVE ng/mL (ref <100)
Oxazepam: NEGATIVE ng/mL (ref <50)
Oxycodone: NEGATIVE ng/mL (ref <100)
Temazepam: NEGATIVE ng/mL (ref <50)
Tramadol: NEGATIVE ng/mL (ref <100)

## 2021-08-19 LAB — MICROALBUMIN / CREATININE URINE RATIO
Creatinine, Urine: 234 mg/dL (ref 20–275)
Microalb Creat Ratio: 3 mcg/mg creat (ref <30)
Microalb, Ur: 0.7 mg/dL

## 2021-08-19 LAB — TSH: TSH: 0.45 mIU/L

## 2021-08-19 LAB — LIPID PANEL
Cholesterol: 243 mg/dL — ABNORMAL HIGH (ref <200)
HDL: 73 mg/dL (ref >=50)
LDL Cholesterol (Calc): 140 mg/dL (calc) — ABNORMAL HIGH
Non-HDL Cholesterol (Calc): 170 mg/dL (calc) — ABNORMAL HIGH (ref <130)
Total CHOL/HDL Ratio: 3.3 (calc) (ref <5.0)
Triglycerides: 163 mg/dL — ABNORMAL HIGH (ref <150)

## 2021-08-19 LAB — DM TEMPLATE

## 2021-08-19 LAB — HEMOGLOBIN A1C
Hgb A1c MFr Bld: 6.2 % of total Hgb — ABNORMAL HIGH (ref <5.7)
Mean Plasma Glucose: 131 mg/dL
eAG (mmol/L): 7.3 mmol/L

## 2021-08-21 ENCOUNTER — Encounter: Payer: Self-pay | Admitting: Family Medicine

## 2021-08-24 ENCOUNTER — Encounter: Payer: Self-pay | Admitting: Family Medicine

## 2021-08-24 ENCOUNTER — Ambulatory Visit (HOSPITAL_BASED_OUTPATIENT_CLINIC_OR_DEPARTMENT_OTHER)
Admission: RE | Admit: 2021-08-24 | Discharge: 2021-08-24 | Disposition: A | Discharge status: Home / Self Care | Payer: Commercial Managed Care - PPO | Source: Ambulatory Visit | Attending: Family Medicine | Admitting: Family Medicine

## 2021-08-24 ENCOUNTER — Other Ambulatory Visit: Payer: Self-pay

## 2021-08-24 DIAGNOSIS — E042 Nontoxic multinodular goiter: Secondary | ICD-10-CM

## 2021-08-29 ENCOUNTER — Telehealth (INDEPENDENT_AMBULATORY_CARE_PROVIDER_SITE_OTHER): Payer: Commercial Managed Care - PPO | Admitting: Family Medicine

## 2021-08-29 ENCOUNTER — Other Ambulatory Visit: Payer: Self-pay

## 2021-08-29 DIAGNOSIS — E042 Nontoxic multinodular goiter: Secondary | ICD-10-CM

## 2021-08-29 DIAGNOSIS — E041 Nontoxic single thyroid nodule: Secondary | ICD-10-CM

## 2021-08-29 NOTE — Progress Notes (Signed)
Virtual Visit via Video Note  I connected with pt on 08/29/21 at  4:00 PM EST by a video enabled telemedicine application and verified that I am speaking with the correct person using two identifiers.  Location patient: [place] Location provider:work or home office Persons participating in the virtual visit: patient, provider  I discussed the limitations and requested verbal permission for telemedicine visit. The patient expressed understanding and agreed to proceed.   HPI: 56 y/o female being seen today to discuss recent thyroid ultrasound results. She had repeat of the thyroid ultrasound 08/24/2018. This once again showed a multinodular goiter with interval enlargement of left superior cystic nodule from 1.4 cm to 1.9 cm.  All the nodules appeared benign she does have some compressive type symptoms.  The main issue she complains of is some feeling of difficulty breathing at times, some voice fatigue/hoarseness, and some cough. Thyroid hormone testing has been normal.  No Known Allergies  Immunization History  Administered Date(s) Administered   Influenza Split 04/11/2012   Influenza,inj,Quad PF,6+ Mos 04/09/2014, 04/09/2015, 05/29/2016, 04/26/2017, 03/19/2018   Influenza-Unspecified 05/03/2020, 05/01/2021   PFIZER(Purple Top)SARS-COV-2 Vaccination 08/30/2019, 09/20/2019   Tdap 04/09/2015   Zoster Recombinat (Shingrix) 01/19/2021, 08/17/2021     ROS: See pertinent positives and negatives per HPI.  Past Medical History:  Diagnosis Date   Borderline diabetes 2022   Depression    Dysmenorrhea    Dyspepsia 08/2017   vs gastritis.  MUCH improved with PPI + getting anxiety controlled.   Fatty liver 08/2017   u/s   Fibroid uterus 08/2016   U/s at GYN   GAD (generalized anxiety disorder)    Gestational diabetes    History of adenomatous polyp of colon 07/2016   Recall 5 yrs (Dr. Havery Moros)   HSV infection    IBS (irritable bowel syndrome)    Insomnia    Menopausal symptoms     GYN started prometrium and vivelle 08/2016.   Menorrhagia    + dysmenorrhea and hx of fibroids: GYN eval 07/2017->FSH normal, saline infusion u/s showed normal uterus/endomet + R ovarian cystic nodule suspected to be hemorrhagic cyst vs endometrioma.  CA 125 level normal.  Pt offered Mirena or hysterectomy and she declined both.   Mixed hyperlipidemia    Multinodular thyroid 06/2020   one cystic/nodule lesion requiring 1 yr f/u ultrasound.  Euthyroid as of 06/2020.   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 IBS.  GYN MD started Bentyl trial 08/2016.   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  +Diverticulosis L colon.   cyst removed from back of right leg  12/2010   ENDOMETRIAL ABLATION  2010   HER OPTION ABLATION FOR MENORRHAGIA   EYE SURGERY  age 1   right eye for strabismus     Current Outpatient Medications:    ALPRAZolam (XANAX) 0.5 MG tablet, TAKE 1 TABLET BY MOUTH 3 TIMES A DAY AS NEEDED FOR SEVERE ANXIETY, Disp: 60 tablet, Rfl: 5   DULoxetine (CYMBALTA) 60 MG capsule, TAKE 1 CAPSULE BY MOUTH EVERY DAY, Disp: 90 capsule, Rfl: 3   ESTROGEL 0.75 MG/1.25 GM (0.06%)  topical gel, SMARTSIG:Topical, Disp: , Rfl:    omeprazole (PRILOSEC) 40 MG capsule, TAKE 1 CAPSULE BY MOUTH EVERY DAY, Disp: 90 capsule, Rfl: 3   progesterone (PROMETRIUM) 100 MG capsule, Take 100 mg by mouth at bedtime., Disp: , Rfl:    testosterone (ANDROGEL) 50 MG/5GM (1%) GEL, Place onto the skin daily., Disp: , Rfl:    zolpidem (AMBIEN) 10 MG tablet, Take 1 tablet (10 mg total) by mouth at bedtime as needed., Disp: 90 tablet, Rfl: 1  EXAM:  VITALS per patient if applicable:   Vitals with BMI 08/17/2021 01/26/2021 08/17/2020  Height 5\' 3"  5\' 3"  5' 3.5"  Weight 137 lbs 10 oz 142 lbs 148 lbs 10 oz  BMI 24.38 93.81 82.99  Systolic 371 696 789  Diastolic 83 82 80  Pulse 96 93 104     GENERAL: alert, oriented, appears well and in no acute distress  HEENT: atraumatic, conjunttiva clear, no obvious abnormalities on inspection of external nose and ears  NECK: normal movements of the head and neck  LUNGS: on inspection no signs of respiratory distress, breathing rate appears normal, no obvious gross SOB, gasping or wheezing  CV: no obvious cyanosis  MS: moves all visible extremities without noticeable abnormality  PSYCH/NEURO: pleasant and cooperative, no obvious depression or anxiety, speech and thought processing grossly intact  LABS:  Lab Results  Component Value Date   TSH 0.45 08/17/2021     Chemistry      Component Value Date/Time   NA 143 08/17/2021 1453   K 4.3 08/17/2021 1453   CL 105 08/17/2021 1453   CO2 27 08/17/2021 1453   BUN 13 08/17/2021 1453   CREATININE 1.04 (H) 08/17/2021 1453      Component Value Date/Time   CALCIUM 9.6 08/17/2021 1453   ALKPHOS 51 12/15/2019 0913   AST 16 08/17/2021 1453   ALT 20 08/17/2021 1453   BILITOT 0.3 08/17/2021 1453     ASSESSMENT AND PLAN:  Discussed the following assessment and plan:  Multinodular goiter, one of the nodules on the left noted to have 1 year and interval enlargement on recent f/u thyroid ultrasound. She has some compressive symptoms. We discussed the options of returning to to her endocrinologist, getting evaluation with interventional radiology, or referral to a general surgeon. Decided to pursue follow-up with her endocrinologist, Dr. Cruzita Lederer.  I discussed the assessment and treatment plan with the patient. The patient was provided an opportunity to ask questions and all were answered. The patient agreed with the plan and demonstrated an understanding of the instructions.    F/u: if further questions  Signed:  Crissie Sickles, MD           08/29/2021

## 2021-09-06 ENCOUNTER — Encounter: Payer: Self-pay | Admitting: Gastroenterology

## 2021-10-24 ENCOUNTER — Encounter: Payer: Self-pay | Admitting: Internal Medicine

## 2021-10-24 ENCOUNTER — Ambulatory Visit (INDEPENDENT_AMBULATORY_CARE_PROVIDER_SITE_OTHER): Payer: Commercial Managed Care - PPO | Admitting: Internal Medicine

## 2021-10-24 VITALS — BP 128/80 | HR 93 | Ht 63.0 in | Wt 136.2 lb

## 2021-10-24 DIAGNOSIS — E042 Nontoxic multinodular goiter: Secondary | ICD-10-CM

## 2021-10-24 NOTE — Patient Instructions (Signed)
We will schedule a thyroid nodule aspiration for you. ? ?You should have an endocrinology follow-up appointment in 1 year. ? ?

## 2021-10-24 NOTE — Progress Notes (Signed)
Patient ID: Karen Oconnell, female   DOB: 1965/10/24, 56 y.o.   MRN: [id number]  ? ?This visit occurred during the SARS-CoV-2 public health emergency.  Safety protocols were in place, including screening questions prior to the visit, additional usage of staff PPE, and extensive cleaning of exam room while observing appropriate contact time as indicated for disinfecting solutions.  ? ?HPI  ?Karen Oconnell is a 56 y.o.-year-old female, initially referred by her PCP, Dr. Anitra Lauth, returning for follow-up for multiple thyroid nodules. Last visit 1 year and 2 mo ago. ? ?Interim history: ?Since last visit, patient developed more shortness of breath and also hoarseness and had another ultrasound obtained by PCP which showed an increase in the size of her left cystic nodule.  She was referred back to endocrinology. ?At this visit, she describes that she has increased stress awareness with anxiety, pressure on her anterior/left upper neck and also hoarseness and mild dysphagia. ? ?Reviewed history: ?She previously had weight gain, irritability, which improved after starting HRT.  However, in 2021, she started to gain weight again, she had more irritability, her allergies returned, and she also noticed a lump in her throat.  She saw Dr. Anitra Lauth, who checked a thyroid ultrasound that showed multiple thyroid cystic nodules. ? ?Since then, her symptoms improved and she does not feel her neck is swollen as before. ? ?Thyroid U/S (06/29/2020): Multiple nodules: ?Parenchymal Echotexture: Moderately heterogenous ?Isthmus: Normal in size measuring 0.3 cm in diameter ?Right lobe: Normal in size measuring 4.3 x 1.6 x 1.1 cm ?Left lobe: Normal in size measuring 4.8 x 1.8 x 1.7 cm ?_____________________________________________________ ?  ?Estimated total number of nodules >/= 1 cm: 6-10 ?_________________________________________________________ ?  ?Scattered punctate (1 cm) right-sided spongiform/benign-appearing ?right-sided thyroid  nodules (labeled #1 through #3), none of which ?meet imaging criteria to recommend percutaneous sampling or ?continued dedicated follow-up with nodules labeled #1 and #2 both ?containing benign colloid. ? ________________________________________________________ ?  ?There is an approximately 1.4 cm minimally complex cyst within the ?superior pole the left lobe of the thyroid labeled 4), which does ?not meet criteria to recommend percutaneous sampling or continued ?dedicated follow-up. ?  ?There is an approximately 1.2 cm mixed cystic and solid nodule ?within the mid aspect the left lobe of the thyroid (labeled 5), ?which does not meet criteria to recommend percutaneous sampling or ?continued dedicated follow-up ?  ?There is an approximately 1.6 cm partially solid though ?predominantly cystic nodule within mid aspect the left lobe of the ?thyroid (labeled 6), which does not meet criteria to recommend ?percutaneous sampling or continued dedicated follow-up. ? ________________________________________________________ ?  ?Nodule # 7: ?Location: Left; Inferior ?Maximum size: 2.2 cm; Other 2 dimensions: 1.5 x 1.3 cm ?Composition: solid/almost completely solid (2) ?Echogenicity: isoechoic (1) ?*Given size (>/= 1.5 - 2.4 cm) and appearance, a follow-up ?ultrasound in 1 year should be considered based on TI-RADS criteria. ?_________________________________________________________ ?  ?IMPRESSION: ?1. Findings suggestive of multinodular goiter. ?2. Nodule #7 meets imaging criteria to recommend a 1 year follow-up ?as indicated. ?3. None of the remaining thyroid nodules meet imaging criteria to ?recommend percutaneous sampling or continued dedicated follow-up. ? ? ? ? ? ? ? ?Thyroid U/S (08/24/2021): ?Parenchymal Echotexture: Moderately heterogenous ?Isthmus: 0.4 cm, previously 0.3 cm ?Right lobe: 5.0 x 1.4 x 1.3 cm, previously 4.3 x 1.6 x 1.1 cm  ?Left lobe: 4.8 x 1.5 x 1.2 cm, previously 4.8 x 1.8 x 1.2 cm   ?_________________________________________________________ ?  ?Estimated total number of nodules >/= 1  cm: 4 ?_________________________________________________________ ?  ?Similar appearance of multifocal spongiform and cystic nodules in ?the right hemi thyroid including right superior (labeled 1, 1.1 cm, ?previously 1.0 cm), right superior/mid (labeled 2, 0.8 cm, ?previously 1.0 cm), right mid (labeled 3, 1.7 cm, previously 1.1 ?cm), and right inferior (0.5 cm, previously 0.5 cm). Each of these ?appear benign and do not warrant additional follow-up. ?  ?Interval enlargement of cystic nodule in the left superior thyroid ?(labeled 1, 1.9 x 1.4 x 1.6 cm, previously 1.4 x 0.6 x 1.1 cm). ?  ?The remaining scattered left-sided thyroid nodules measure less than ?1 cm and are spongiform, cystic, or solid isoechoic. Each of these ?nodules appears benign and does not warrant additional sound ?follow-up or tissue sampling. The previously visualized nodule ?labeled 7 is not conspicuous on this study. ?  ?No cervical lymphadenopathy. ?  ?IMPRESSION: ?1. Multinodular goiter. ?2. Interval enlargement of previously visualized left superior ?cystic thyroid nodule (labeled 1, 1.9 cm, previously 1.4 cm), which ?is almost certainly benign, not warranting imaging follow-up. If ?this nodule is symptomatic, it is amenable to ultrasound-guided ?aspiration and possible sclerotherapy by Interventional Radiology. ?3. The remaining visualized thyroid nodules appear benign and do not ?warrant additional follow-up or tissue sampling. ? ?Pt denies: ?- feeling nodules in neck ?- hoarseness ?- dysphagia ?- choking ?- SOB with lying down ?She does have globus sensation. ? ?I reviewed pt's thyroid tests: ?Lab Results  ?Component Value Date  ? TSH 0.45 08/17/2021  ? TSH 0.73 06/22/2020  ? TSH 0.91 12/15/2019  ? TSH 0.55 04/27/2017  ? TSH 0.64 10/08/2015  ? TSH 0.48 04/09/2014  ? TSH 0.783 05/15/2013  ? TSH 0.54 03/02/2010  ? FREET4 0.84 06/22/2020   ?  ?TPO antibodies were not elevated: ?Component ?    Latest Ref Rng & Units 06/22/2020  ?Thyroperoxidase Ab SerPl-aCnc ?    <9 IU/mL 1  ? ?She continues to have: ?- fatigue ?- anxiety/depression ?- Weight gain ?- Dry skin ?- Hair loss ? ?No FH of thyroid ds. No FH of thyroid cancer. No h/o radiation tx to head or neck. ?No recent contrast studies. + steroid use - every 3 month - spine. No herbal supplements. No Biotin supplements or Hair, Skin and Nails vitamins. On MVI. ? ?Pt also has a  history of diabetes (diagnosed around the time of her thyroid nodules), previously prediabetes. Her mother had diabetes and was insulin-dependent.  She saw nutrition and started to change her diet.  Latest HbA1c level was improved ?Lab Results  ?Component Value Date  ? HGBA1C 6.2 (H) 08/17/2021  ? ?On HRT by ObGyn. ? ?ROS: ?Constitutional: + See HPI  ? ?Past Medical History:  ?Diagnosis Date  ? Borderline diabetes 2022  ? Depression   ? Dysmenorrhea   ? Dyspepsia 08/2017  ? vs gastritis.  MUCH improved with PPI + getting anxiety controlled.  ? Fatty liver 08/2017  ? u/s  ? Fibroid uterus 08/2016  ? U/s at GYN  ? GAD (generalized anxiety disorder)   ? Gestational diabetes   ? History of adenomatous polyp of colon 07/2016  ? Recall 5 yrs (Dr. Havery Moros)  ? HSV infection   ? IBS (irritable bowel syndrome)   ? Insomnia   ? Menopausal symptoms   ? GYN started prometrium and vivelle 08/2016.  ? Menorrhagia   ? + dysmenorrhea and hx of fibroids: GYN eval 07/2017->FSH normal, saline infusion u/s showed normal uterus/endomet + R ovarian cystic nodule suspected to be hemorrhagic cyst  vs endometrioma.  CA 125 level normal.  Pt offered Mirena or hysterectomy and she declined both.  ? Mixed hyperlipidemia   ? Multinodular thyroid 06/2020  ? one cystic/nodule lesion requiring 1 yr f/u ultrasound.  Euthyroid as of 06/2020.  ? Neck pain, musculoskeletal 02/06/2011  ? 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 Mild  ? Postprandial bloating 08/2016   ? ? IBS.  GYN MD started Bentyl trial 08/2016.  ? 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 Seasonal allergic rhinitis   ? ?Past Surgical History:  ?Procedure Laterality Date  ? BREAST BIOPSY Right 2013  ? benign  ? [employer]

## 2021-11-01 ENCOUNTER — Ambulatory Visit (HOSPITAL_COMMUNITY): Admission: RE | Admit: 2021-11-01 | Payer: Commercial Managed Care - PPO | Source: Ambulatory Visit

## 2021-11-01 ENCOUNTER — Telehealth: Payer: Self-pay | Admitting: Student

## 2021-11-01 NOTE — Progress Notes (Signed)
Patient ID: Karen Oconnell, female   DOB: 1966/03/14, 56 y.o.   MRN: [id number]  Patient was scheduled for US thyroid FNA at 1 pm today, and it was found that the patient actually needs thyroid cyst aspiration and possible sclerotherapy per Dr. Serafina Royals.   Informed the patient that the procedure is new to our practice and not all IR radiologist are familiar with it. Informed the patient that patient is reschedule for the procedure on Friday 5/12 at 1 pm at [facility].  Informed the patient that she will NOT need sedation for the procedure, asked to come to [facility] front desk by 12:30 pm on 5/12.  Patient verbalized understanding, states that the appointment works for her.   Please call [facility] IR at [phone] for questions regarding your appointment.   Armando Gang Sayde Lish PA-C 11/01/2021 4:01 PM

## 2021-11-11 ENCOUNTER — Other Ambulatory Visit: Payer: Self-pay | Admitting: Internal Medicine

## 2021-11-11 ENCOUNTER — Ambulatory Visit (HOSPITAL_COMMUNITY)
Admission: RE | Admit: 2021-11-11 | Discharge: 2021-11-11 | Disposition: A | Discharge status: Home / Self Care | Payer: Commercial Managed Care - PPO | Source: Ambulatory Visit | Attending: Internal Medicine | Admitting: Internal Medicine

## 2021-11-11 DIAGNOSIS — E042 Nontoxic multinodular goiter: Secondary | ICD-10-CM

## 2021-11-11 MED ORDER — LIDOCAINE HCL 1 % IJ SOLN
INTRAMUSCULAR | Status: AC
  Filled 2021-11-11: qty 20

## 2022-01-01 ENCOUNTER — Other Ambulatory Visit: Payer: Self-pay | Admitting: Family Medicine

## 2022-02-15 ENCOUNTER — Ambulatory Visit: Payer: Commercial Managed Care - PPO | Admitting: Family Medicine

## 2022-02-22 ENCOUNTER — Encounter: Payer: Self-pay | Admitting: Family Medicine

## 2022-02-22 ENCOUNTER — Ambulatory Visit (INDEPENDENT_AMBULATORY_CARE_PROVIDER_SITE_OTHER): Payer: BC Managed Care – PPO | Admitting: Family Medicine

## 2022-02-22 VITALS — BP 130/84 | HR 85 | Temp 98.0°F | Ht 63.0 in | Wt 136.6 lb

## 2022-02-22 DIAGNOSIS — F411 Generalized anxiety disorder: Secondary | ICD-10-CM

## 2022-02-22 DIAGNOSIS — R7303 Prediabetes: Secondary | ICD-10-CM | POA: Diagnosis not present

## 2022-02-22 DIAGNOSIS — F5101 Primary insomnia: Secondary | ICD-10-CM | POA: Diagnosis not present

## 2022-02-22 DIAGNOSIS — E042 Nontoxic multinodular goiter: Secondary | ICD-10-CM

## 2022-02-22 DIAGNOSIS — K219 Gastro-esophageal reflux disease without esophagitis: Secondary | ICD-10-CM

## 2022-02-22 LAB — POCT GLYCOSYLATED HEMOGLOBIN (HGB A1C)
HbA1c POC (<> result, manual entry): 6.1 % (ref 4.0–5.6)
HbA1c, POC (controlled diabetic range): 6.1 % (ref 0.0–7.0)
HbA1c, POC (prediabetic range): 6.1 % (ref 5.7–6.4)
Hemoglobin A1C: 6.1 % — AB (ref 4.0–5.6)

## 2022-02-22 MED ORDER — ZOLPIDEM TARTRATE 10 MG PO TABS: 10.0000 mg | ORAL_TABLET | Freq: Every evening | ORAL | 1 refills | Status: DC | PRN

## 2022-02-22 MED ORDER — ALPRAZOLAM 0.5 MG PO TABS: ORAL_TABLET | ORAL | 5 refills | Status: DC

## 2022-02-22 MED ORDER — DULOXETINE HCL 60 MG PO CPEP: 60.0000 mg | ORAL_CAPSULE | Freq: Every day | ORAL | 3 refills | Status: DC

## 2022-02-22 MED ORDER — OMEPRAZOLE 40 MG PO CPDR: 40.0000 mg | DELAYED_RELEASE_CAPSULE | Freq: Every day | ORAL | 3 refills | Status: DC

## 2022-02-22 NOTE — Progress Notes (Signed)
OFFICE VISIT  02/22/2022  CC:  Chief Complaint  Patient presents with   Diabetes    Pt is not fasting   Insomnia   Patient is a 56 y.o. female who presents for 65-monthfollow-up diabetes, insomnia, chronic anxiety (high risk med use), multinodular goiter.  INTERIM HX: RDwyane Luois doing well. [place] like a lot of pressure has been off her lately. She takes her alprazolam anywhere from 1-2 times a day. This helps a lot.  She saw Dr. GCruzita Ledererfor follow-up 10/24/2021.  Thyroid nodule/cyst aspiration was discussed and this was attempted but essentially her cyst had collapsed at that time and there was nothing to aspirate.  She has had no shortness of breath or swallowing dysfunction or anterior neck pain.  She continues to eat a healthy diet.  ROS as above, plus--> no fevers, no CP, no SOB, no wheezing, no cough, no dizziness, no HAs, no rashes, no melena/hematochezia.  No polyuria or polydipsia.  No myalgias or arthralgias.  No focal weakness, paresthesias, or tremors.  No acute vision or hearing abnormalities.  No dysuria or unusual/new urinary urgency or frequency.  No recent changes in lower legs. No n/v/d or abd pain.  No palpitations.     Past Medical History:  Diagnosis Date   Borderline diabetes 2022   Depression    Dysmenorrhea    Dyspepsia 08/2017   vs gastritis.  MUCH improved with PPI + getting anxiety controlled.   Fatty liver 08/2017   u/s   Fibroid uterus 08/2016   U/s at GYN   GAD (generalized anxiety disorder)    Gestational diabetes    History of adenomatous polyp of colon 07/2016   Recall 5 yrs (Dr. AHavery Moros   HSV infection    IBS (irritable bowel syndrome)    Insomnia    Menopausal symptoms    GYN started prometrium and vivelle 08/2016.   Menorrhagia    + dysmenorrhea and hx of fibroids: GYN eval 07/2017->FSH normal, saline infusion u/s showed normal uterus/endomet + R ovarian cystic nodule suspected to be hemorrhagic cyst  vs endometrioma.  CA 125 level normal.  Pt offered Mirena or hysterectomy and she declined both.   Mixed hyperlipidemia    Multinodular thyroid 06/2020   one cystic/nodule lesion requiring 1 yr f/u ultrasound.  Euthyroid as of 06/2020.   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 IBS.  GYN MD started Bentyl trial 08/2016.   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  +Diverticulosis L colon.   cyst removed from back of right leg  12/2010   ENDOMETRIAL ABLATION  2010   HER OPTION ABLATION FOR MENORRHAGIA   EYE SURGERY  age 56  right eye for strabismus    Outpatient Medications Prior to Visit  Medication Sig Dispense Refill   ESTROGEL 0.75 MG/1.25 GM (0.06%) topical gel 1.25 g daily. 1 pump     progesterone (PROMETRIUM) 100 MG capsule Take 100 mg by mouth at bedtime.     ALPRAZolam (XANAX) 0.5 MG tablet TAKE 1 TABLET BY MOUTH 3 TIMES  A DAY AS NEEDED FOR SEVERE ANXIETY 60 tablet 5   DULoxetine (CYMBALTA) 60 MG capsule TAKE 1 CAPSULE BY MOUTH EVERY DAY 90 capsule 0   fluticasone (FLONASE) 50 MCG/ACT nasal spray Place 1 spray into both nostrils daily as needed for allergies. (Patient not taking: Reported on 02/22/2022)     omeprazole (PRILOSEC) 40 MG capsule TAKE 1 CAPSULE BY MOUTH EVERY DAY 90 capsule 0   zolpidem (AMBIEN) 10 MG tablet Take 1 tablet (10 mg total) by mouth at bedtime as needed. (Patient taking differently: Take 10 mg by mouth at bedtime.) 90 tablet 1   No facility-administered medications prior to visit.    No Known Allergies  ROS As per HPI  PE:    02/22/2022   11:03 AM 10/24/2021    1:39 PM 08/17/2021    1:58 PM  Vitals with BMI   Height '5\' 3"'$  '5\' 3"'$  '5\' 3"'$   Weight 136 lbs 10 oz 136 lbs 3 oz 137 lbs 10 oz  BMI 24.2 57.26 20.35  Systolic 597 416 384  Diastolic 84 80 83  Pulse 85 93 96     Physical Exam  Gen: Alert, well appearing.  Patient is oriented to person, place, time, and situation.. AFFECT: pleasant, lucid thought and speech. No further exam today.  LABS:  Last CBC Lab Results  Component Value Date   WBC 7.0 08/17/2021   HGB 13.9 08/17/2021   HCT 42.0 08/17/2021   MCV 88.1 08/17/2021   MCH 29.1 08/17/2021   RDW 13.0 08/17/2021   PLT 426 (H) 53/64/6803   Last metabolic panel Lab Results  Component Value Date   GLUCOSE 117 (H) 08/17/2021   NA 143 08/17/2021   K 4.3 08/17/2021   CL 105 08/17/2021   CO2 27 08/17/2021   BUN 13 08/17/2021   CREATININE 1.04 (H) 08/17/2021   GFRNONAA >60 09/10/2017   CALCIUM 9.6 08/17/2021   PROT 6.7 08/17/2021   ALBUMIN 4.4 12/15/2019   BILITOT 0.3 08/17/2021   ALKPHOS 51 12/15/2019   AST 16 08/17/2021   ALT 20 08/17/2021   ANIONGAP 8 09/10/2017   Last lipids Lab Results  Component Value Date   CHOL 243 (H) 08/17/2021   HDL 73 08/17/2021   LDLCALC 140 (H) 08/17/2021   TRIG 163 (H) 08/17/2021   CHOLHDL 3.3 08/17/2021   Last hemoglobin A1c Lab Results  Component Value Date   HGBA1C 6.1 (A) 02/22/2022   HGBA1C 6.1 02/22/2022   HGBA1C 6.1 02/22/2022   HGBA1C 6.1 02/22/2022   Last thyroid functions Lab Results  Component Value Date   TSH 0.45 08/17/2021   T3TOTAL 84 06/22/2020   IMPRESSION AND PLAN:  1 Borderline type 2 diabetes, diet controlled. POC Hba1c today is 6.1%.  2 generalized anxiety disorder and insomnia. Doing well on Cymbalta 60 mg a day and alprazolam 0.5 mg 1-2 times a day. Additionally, she is getting great response with use of Ambien 10 mg every night. Refilled both today.  #3 multinodular goiter. Followed by Dr. Cruzita Lederer and endocrinology. Asymptomatic at this time.  She has been euthyroid.  #4  GERD.  Responding  well to omeprazole 40 mg a day. Refilled today.  An After Visit Summary was printed and given to the patient.  FOLLOW UP: Return in about 6 months (around 08/25/2022) for annual CPE (fasting). Cpe 74moSigned:  PCrissie Sickles MD           02/22/2022

## 2022-02-28 ENCOUNTER — Telehealth: Payer: Self-pay

## 2022-02-28 NOTE — Telephone Encounter (Signed)
Please review and advise   [facility] - Client TELEPHONE ADVICE RECORD [facility] Patient Name: Karen Oconnell Gender: Female DOB: 1965-10-23 Age: 56 Y 38 M 18 D Return Phone Number: [phone] (Primary) Address: City/ State/ Zip: [place] [address] Client [place] [facility] - Client Client Site [place] - Day Provider Crissie Sickles - MD Contact Type Call Who Is Calling Patient / Member / Family / Caregiver Call Type Triage / Clinical Relationship To Patient Self Return Phone Number [phone] (Primary) Chief Complaint Nasal Congestion Reason for Call Symptomatic / Request for [place] states she was in the office on Wednesday. She just tested positive for COVID today. She was wanting to know if she could get some medication for it. She is having allergies, her head is stopped up and body aches. Translation No Nurse Assessment Nurse: Ferdinand Lango, RN, Kenney Houseman Date/Time (Eastern Time): 02/25/2022 2:06:06 PM Confirm and document reason for call. If symptomatic, describe symptoms. ---Caller states she is COVID positive. S/S began yesterday. Sore throat, head congestion, sneezing, body aches, HA, cough that is non-productive, but denies fever or any other issues. Covid vaccinated w/ 1 booster Does the patient have any new or worsening symptoms? ---Yes Will a triage be completed? ---Yes Related visit to physician within the last 2 weeks? ---No Does the PT have any chronic conditions? (i.e. diabetes, asthma, this includes High risk factors for pregnancy, etc.) ---Yes List chronic conditions. ---Menopause, Insomnia, Depression, Anxiety Is this a behavioral health or substance abuse call? ---No Guidelines Guideline Title Affirmed Question Affirmed Notes Nurse Date/Time (Eastern Time) COVID-19 - Diagnosed or Suspected [1] AQTMA-26 diagnosed by positive lab test (e.g., PCR,  rapid self-test kit) AND [2] mild symptoms Kelle Darting, Kenney Houseman 02/25/2022 2:09:14 PM PLEASE NOTE: All timestamps contained within this report are represented as [place] Standard Time. CONFIDENTIALTY NOTICE: This fax transmission is intended only for the addressee. It contains information that is legally privileged, confidential or otherwise protected from use or disclosure. If you are not the intended recipient, you are strictly prohibited from reviewing, disclosing, copying using or disseminating any of this information or taking any action in reliance on or regarding this information. If you have received this fax in error, please notify us immediately by telephone so that we can arrange for its return to [place]. Phone: [phone], Toll-Free: [phone], Fax: [phone] Page: 2 of 2 Call Id: [id number] Guidelines Guideline Title Affirmed Question Affirmed Notes Nurse Date/Time Eilene Ghazi Time) (e.g., cough, fever, others) AND [3] no complications or SOB Disp. Time Eilene Ghazi Time) Disposition Final User 02/25/2022 2:31:59 PM Home Care Yes Ferdinand Lango, RN, Kenney Houseman Final Disposition 02/25/2022 2:31:59 PM Home Care Yes Ferdinand Lango, RN, [place] Disagree/Comply Comply Caller Understands Yes PreDisposition Did not know what to do Care Advice Given Per Guideline HOME CARE: * You should be able to treat this at home. REASSURANCE AND EDUCATION - POSITIVE COVID-19 LAB TEST AND MILD SYMPTOMS: * You had a recent lab test for COVID-19 and it came back positive. GENERAL CARE ADVICE FOR COVID-19 SYMPTOMS: * The symptoms are generally treated the same whether you have COVID-19, influenza or some other respiratory virus. * Cough: Use cough drops. * Sore throat: Try throat lozenges, hard candy or warm chicken broth. * Feeling dehydrated: Drink extra liquids. If the air in your home is dry, use a humidifier. * Muscle aches, headache, and other pains: Often this comes and goes with the fever. Take acetaminophen every  4 to 6 hours (Adults 650 mg) OR ibuprofen every 6 to 8 hours (Adults 400 mg). Before taking any medicine, read all the instructions on the package. COUGH MEDICINES: * HOME REMEDY - HONEY: This old home remedy has been shown to help decrease coughing at night. The adult dosage is 2 teaspoons (10 ml) at bedtime. AVOID TOBACCO SMOKE: CALL BACK IF: * Fever over 103 F (39.4 C) * Chest pain or difficulty breathing occurs * You become worse CARE ADVICE given per COVID-19 - DIAGNOSED OR SUSPECTED (Adult) guideline. Comments User: Rayne Du, RN Date/Time Eilene Ghazi Time): 02/25/2022 2:29:38 PM BMI 24.3 REF CDC BMI Calculator 5'3" and wt 137lb

## 2022-03-01 NOTE — Telephone Encounter (Signed)
Can you get her on the schedule for 11:00 today?  Virtual is okay.

## 2022-03-01 NOTE — Telephone Encounter (Signed)
Patient is feeling better, she declined appt

## 2022-04-23 IMAGING — US US THYROID
1 series · 13 of 25 positions shown · non-contrast
Comparison: 06/29/2020

CLINICAL DATA: Goiter.

EXAM:
THYROID ULTRASOUND
TECHNIQUE: Ultrasound examination of the thyroid gland and adjacent soft
tissues was performed.

[Series 1: us thyroid · 13 of 78 slices shown]
[im 1/78]
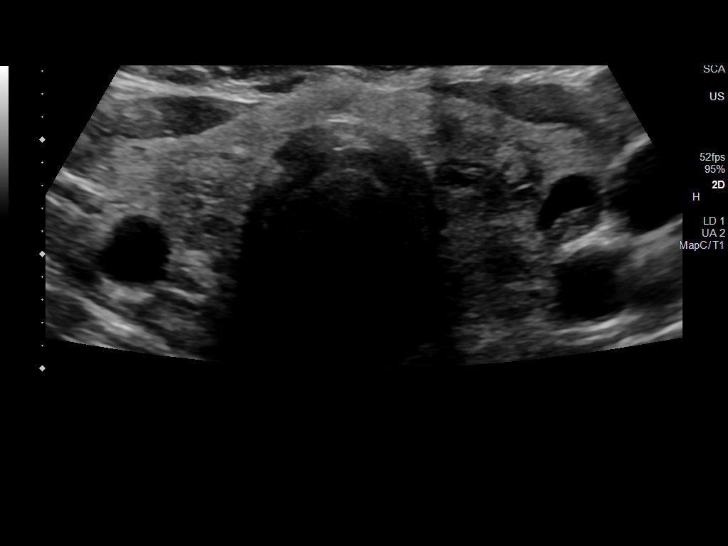
[im 7/78]
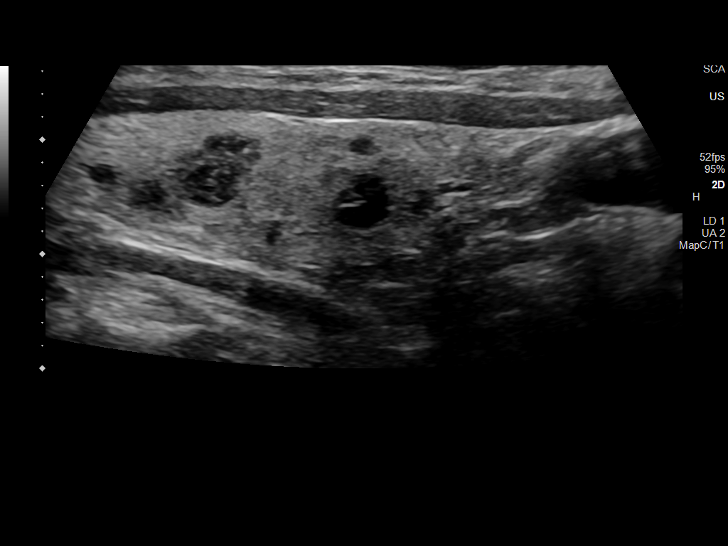
[im 13/78]
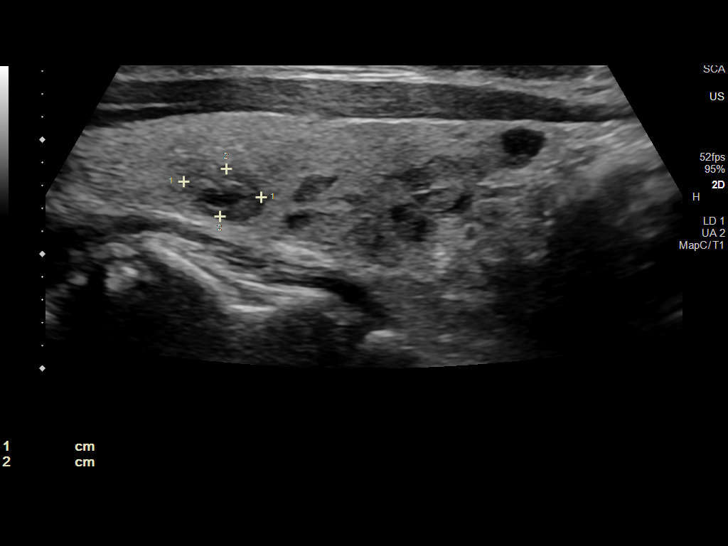
[im 20/78]
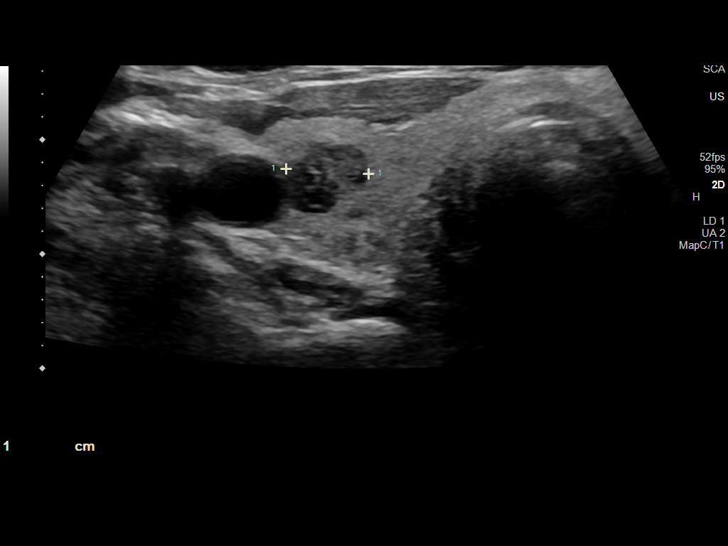
[im 26/78]
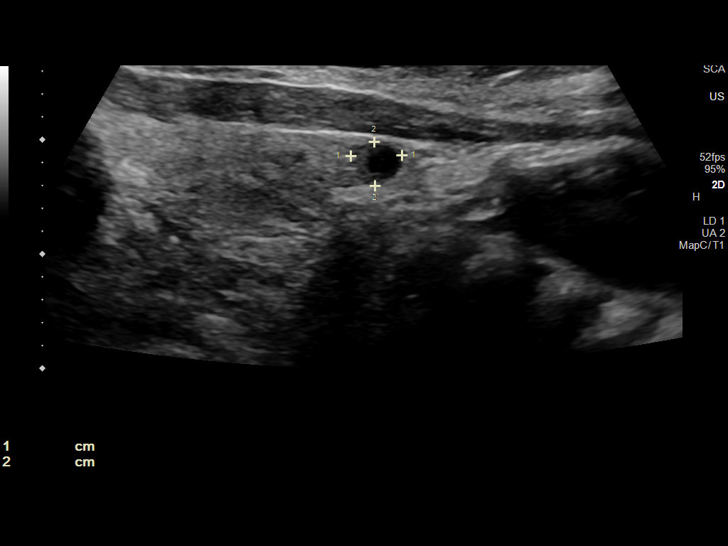
[im 33/78]
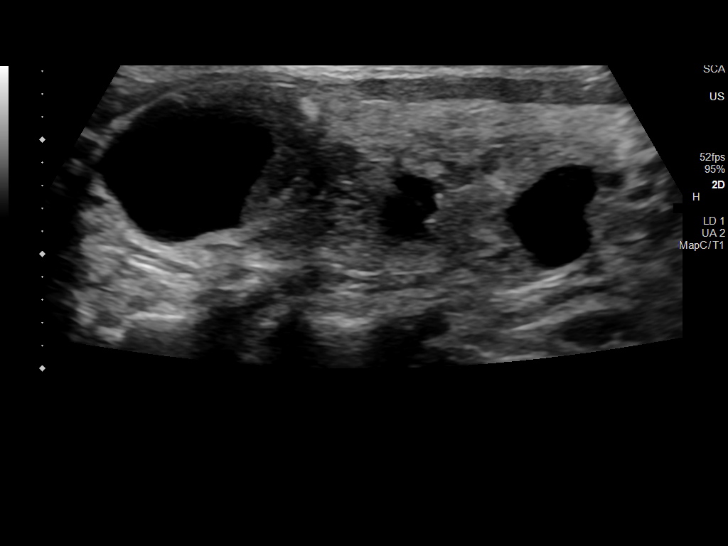
[im 39/78]
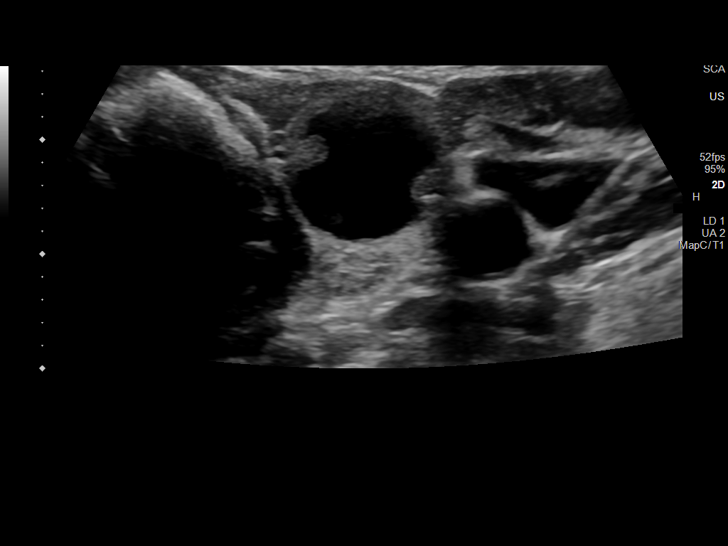
[im 45/78]
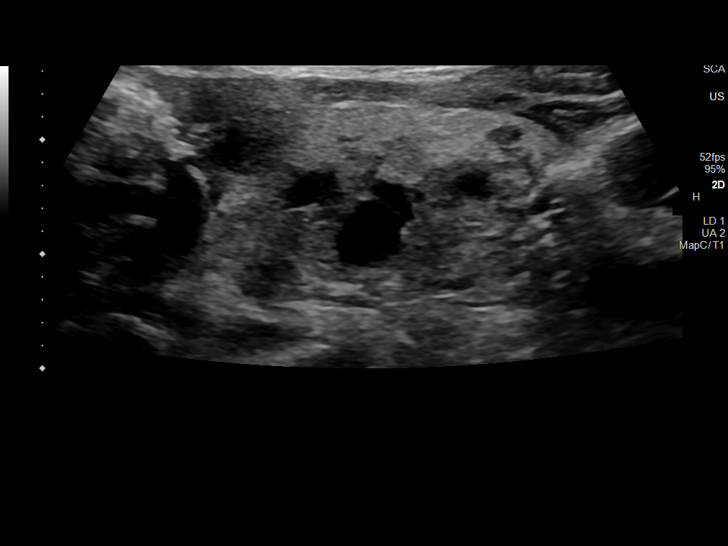
[im 52/78]
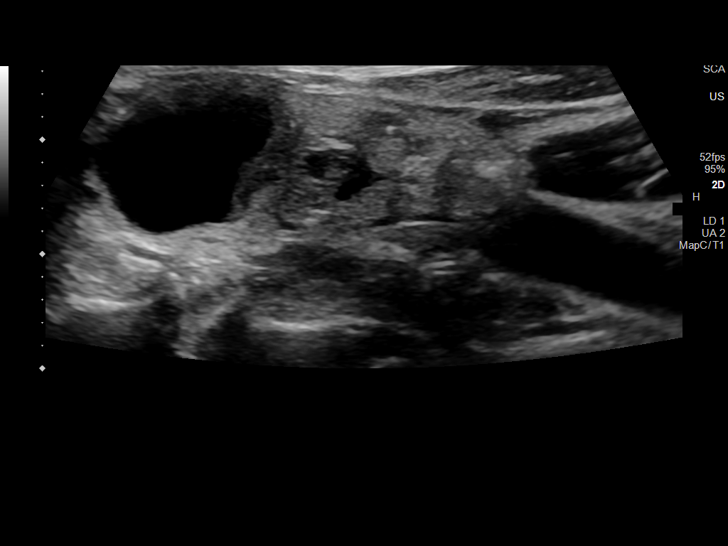
[im 58/78]
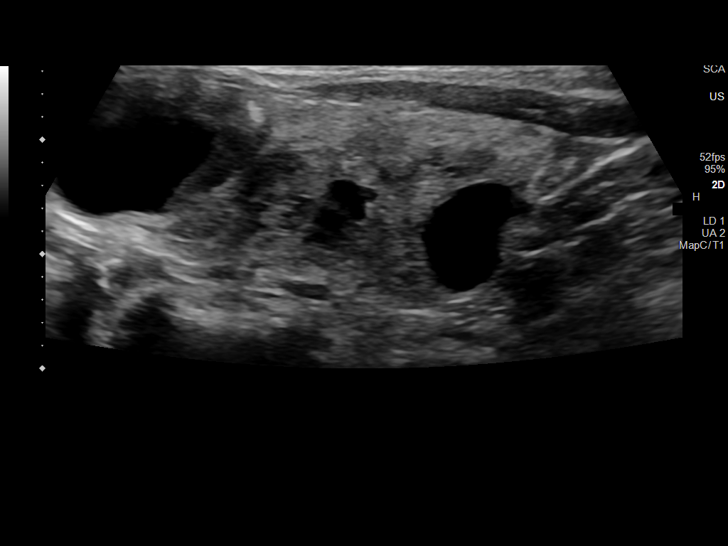
[im 65/78]
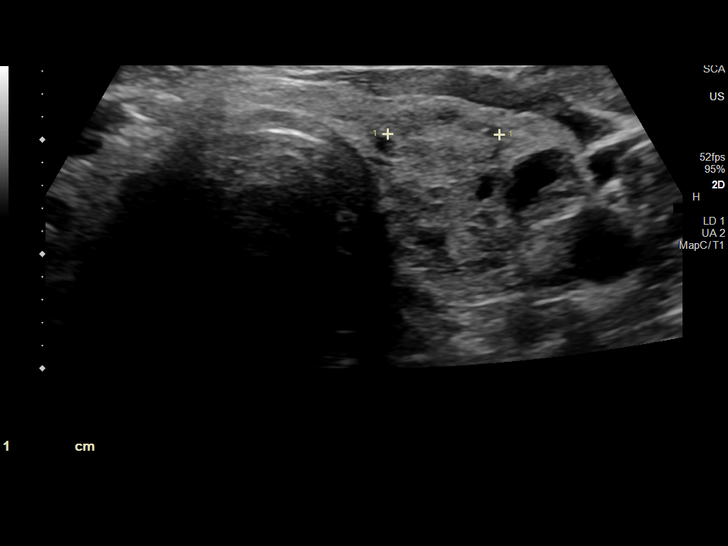
[im 71/78]
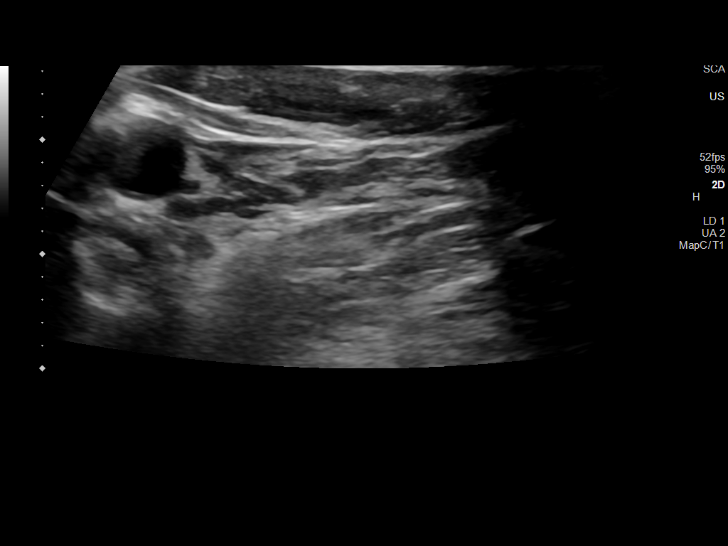
[im 78/78]
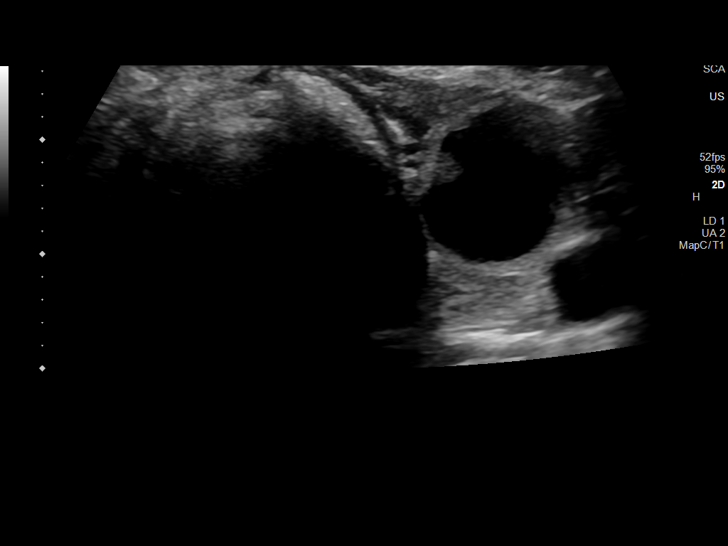

[13 of 25 positions shown; findings below may reference images not displayed]

FINDINGS: Parenchymal Echotexture: Moderately heterogenous

Isthmus: 0.4 cm, previously 0.3 cm

Right lobe: 5.0 x 1.4 x 1.3 cm, previously 4.3 x 1.6 x 1.1 cm

Left lobe: 4.8 x 1.5 x 1.2 cm, previously 4.8 x 1.8 x 1.2 cm

_________________________________________________________

Estimated total number of nodules >/= 1 cm: 4

Number of spongiform nodules >/=  2 cm not described below (TR1): 0

Number of mixed cystic and solid nodules >/= 1.5 cm not described
below (TR2): 0

_________________________________________________________

Similar appearance of multifocal spongiform and cystic nodules in
the right hemi thyroid including right superior (labeled 1, 1.1 cm,
previously 1.0 cm), right superior/mid (labeled 2, 0.8 cm,
previously 1.0 cm), right mid (labeled 3, 1.7 cm, previously
cm), and right inferior (0.5 cm, previously 0.5 cm). Each of these
appear benign and do not warrant additional follow-up.

Interval enlargement of cystic nodule in the left superior thyroid
(labeled 1, 1.9 x 1.4 x 1.6 cm, previously 1.4 x 0.6 x 1.1 cm).

The remaining scattered left-sided thyroid nodules measure less than
1 cm and are spongiform, cystic, or solid isoechoic. Each of these
nodules appears benign and does not warrant additional sound
follow-up or tissue sampling. The previously visualized nodule
labeled 7 is not conspicuous on this study.

No cervical lymphadenopathy.
IMPRESSION: 1. Multinodular goiter.
2. Interval enlargement of previously visualized left superior
cystic thyroid nodule (labeled 1, 1.9 cm, previously 1.4 cm), which
is almost certainly benign, not warranting imaging follow-up. If
this nodule is symptomatic, it is amenable to ultrasound-guided
aspiration and possible sclerotherapy by Interventional Radiology.
3. The remaining visualized thyroid nodules appear benign and do not
warrant additional follow-up or tissue sampling.

The above is in keeping with the ACR TI-RADS recommendations - [HOSPITAL] 6662;[DATE].

## 2022-06-28 NOTE — Progress Notes (Signed)
This encounter was created in error - please disregard.

## 2022-07-11 IMAGING — US US THYROID
1 series · 6 of 6 positions shown · non-contrast
Comparison: 08/24/2021, 06/29/2020

CLINICAL DATA: 56-year-old female with history of enlarging,
symptomatic left superior simple thyroid cyst measuring up to
cm. The patient presents for cyst aspiration.

EXAM:
THYROID ULTRASOUND
TECHNIQUE: Ultrasound examination of the thyroid gland and adjacent soft
tissues was performed.

[Series 1: us fna bx thyroid 1st lesion afirma · 6 of 6 slices shown]
[im 1/6]
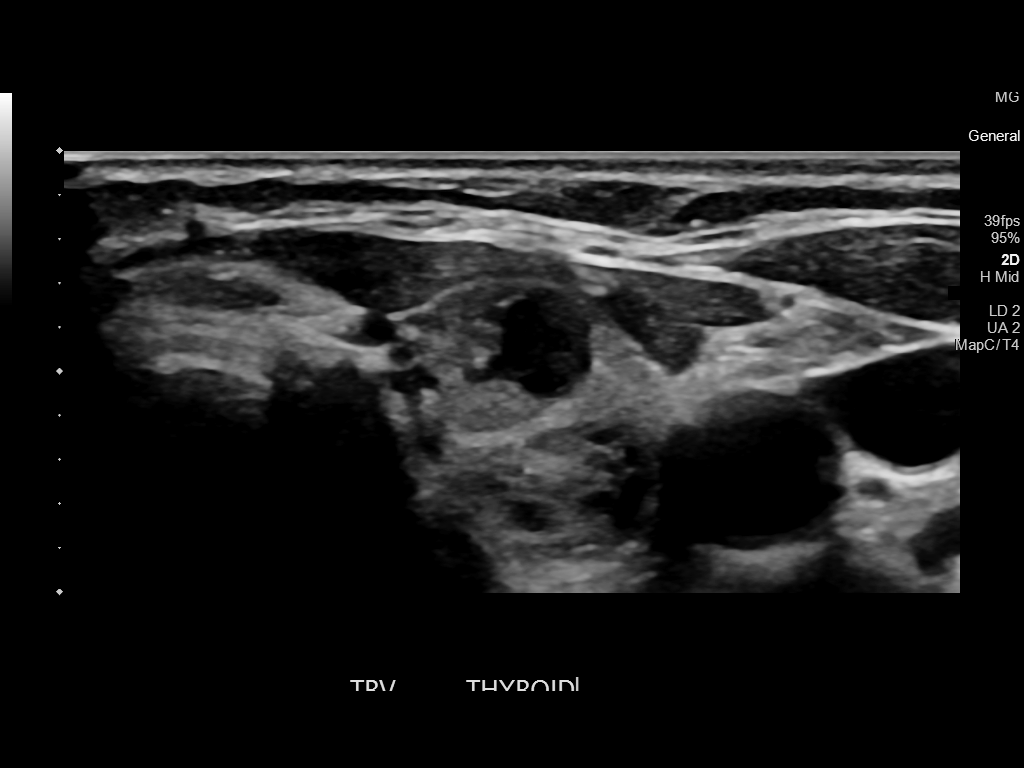
[im 2/6]
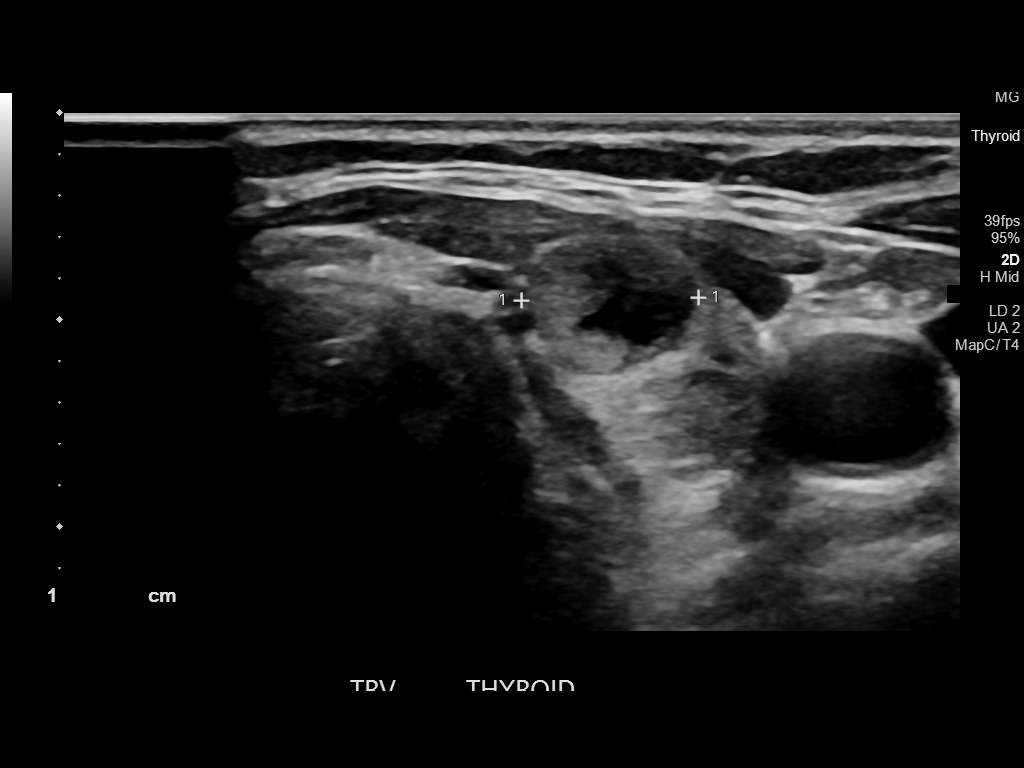
[im 3/6]
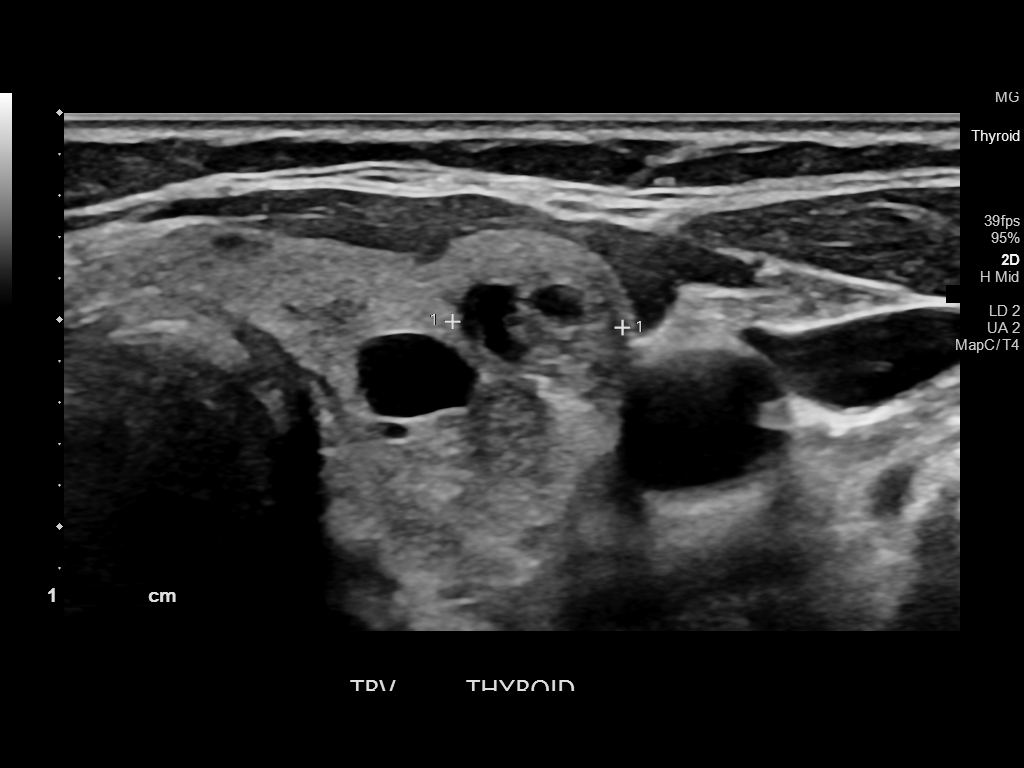
[im 4/6]
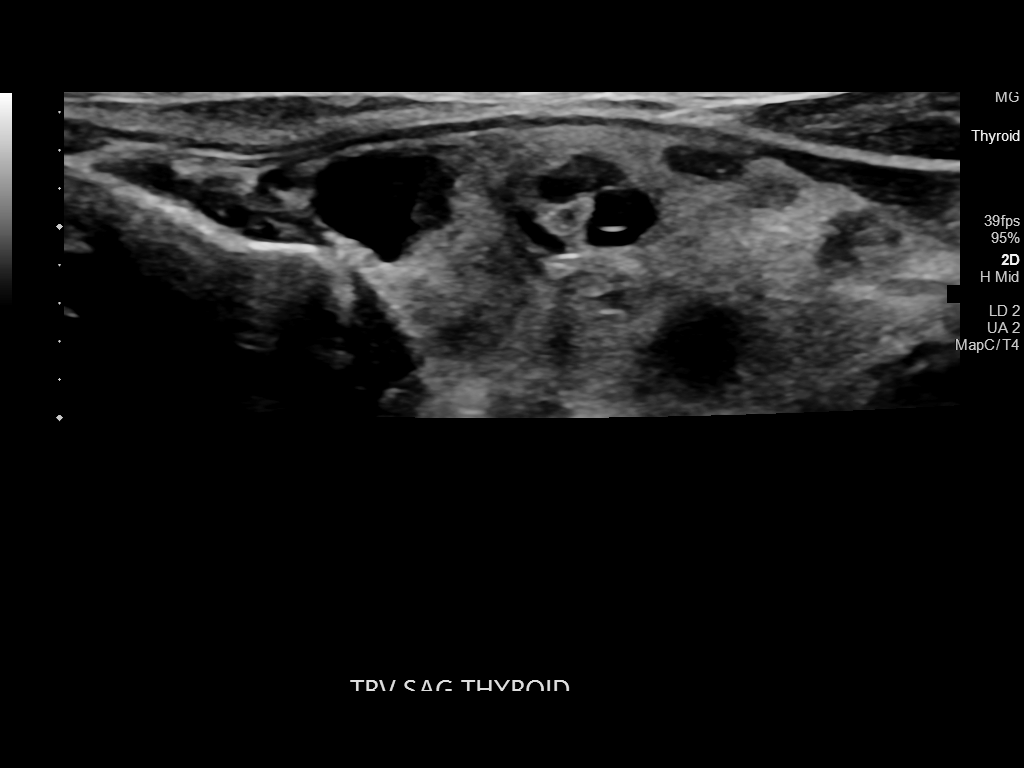
[im 5/6]
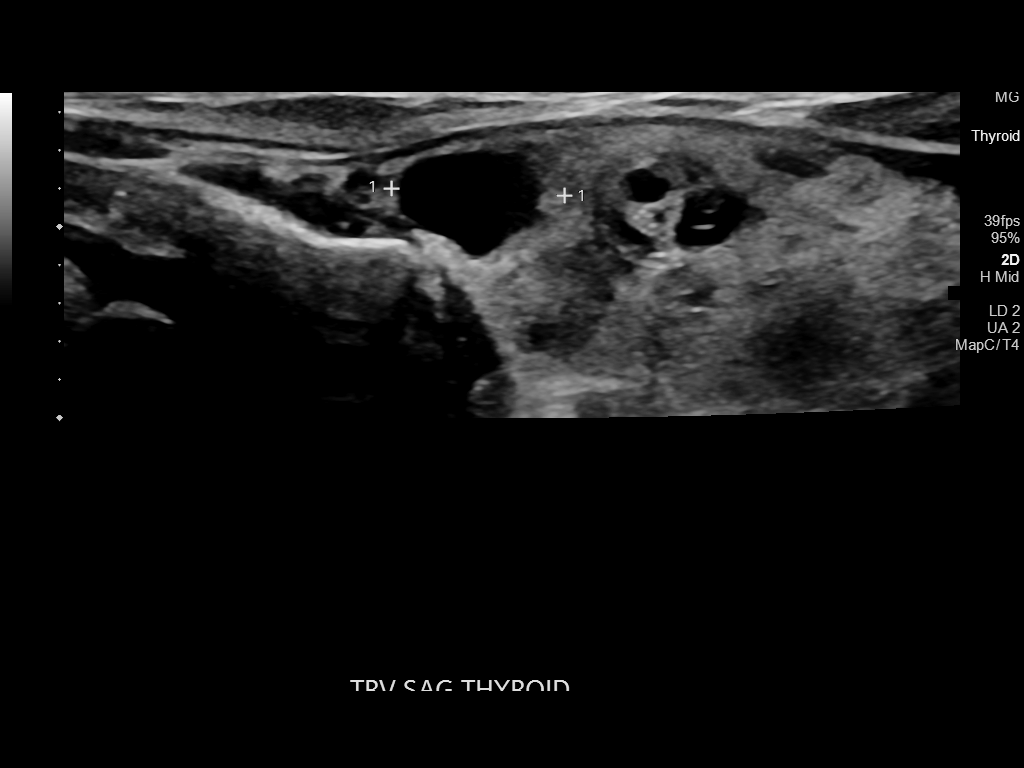
[im 6/6]
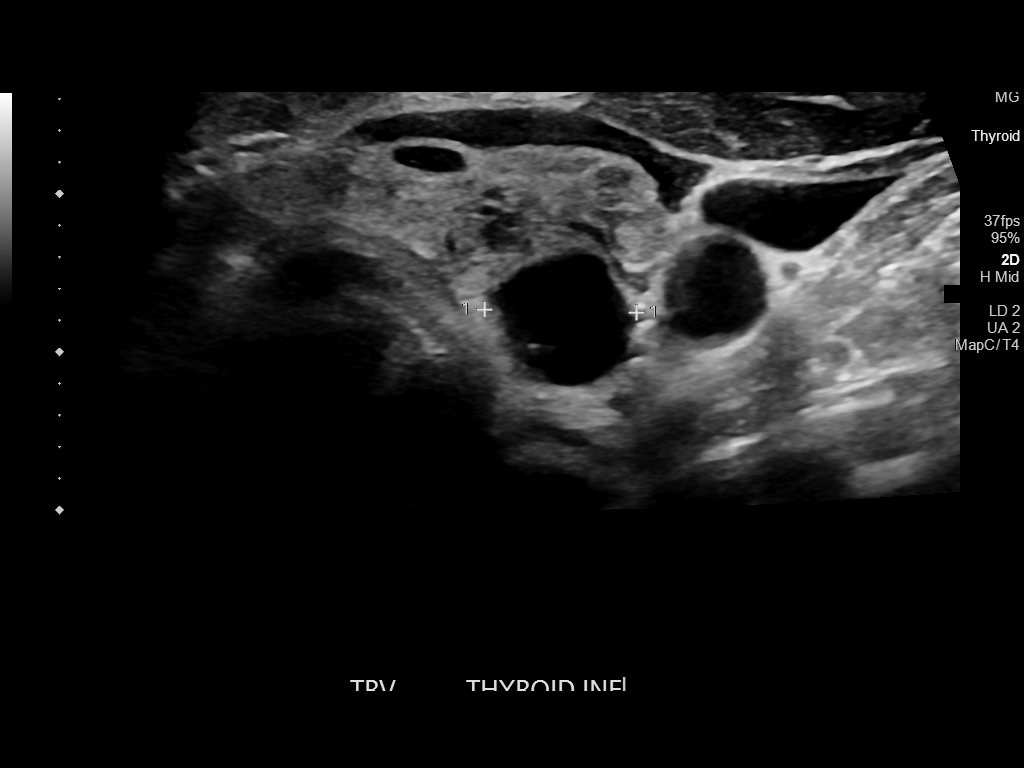

[6 of 6 positions shown; findings below may reference images not displayed]

FINDINGS: Ultrasound evaluation of the left hemi thyroid demonstrates moderate
heterogeneity of the thyroid parenchyma. There are multifocal solid
cystic and cystic nodules in the left thyroid lobe. The left
superior thyroid cyst is now significantly decreased in size
measuring up to 0.9 cm, previously 1.9 cm.

Given significant decreased size of previously visualized left
superior thyroid cyst, the decision was Jamaliel Saltar of the patient
to defer cyst aspiration at this time, particularly in light of
decreased reported symptoms.
IMPRESSION: Significantly decreased size of left superior cystic thyroid nodule,
now measuring up to 0.9 cm (previously 1.9 cm).

The above is in keeping with the ACR TI-RADS recommendations - [HOSPITAL] 6804;[DATE].

PLAN:
Recommend follow-up ultrasound in 3 months. If symptoms recur
sooner, ultrasound evaluation of possible cyst aspiration can be
reconsidered.

## 2022-08-25 ENCOUNTER — Encounter: Payer: Self-pay | Admitting: Family Medicine

## 2022-08-25 ENCOUNTER — Ambulatory Visit (INDEPENDENT_AMBULATORY_CARE_PROVIDER_SITE_OTHER): Payer: BC Managed Care – PPO | Admitting: Family Medicine

## 2022-08-25 VITALS — BP 139/80 | HR 81 | Temp 97.7°F | Ht 63.0 in | Wt 134.4 lb

## 2022-08-25 DIAGNOSIS — Z23 Encounter for immunization: Secondary | ICD-10-CM | POA: Diagnosis not present

## 2022-08-25 DIAGNOSIS — F5104 Psychophysiologic insomnia: Secondary | ICD-10-CM

## 2022-08-25 DIAGNOSIS — E7800 Pure hypercholesterolemia, unspecified: Secondary | ICD-10-CM

## 2022-08-25 DIAGNOSIS — Z79899 Other long term (current) drug therapy: Secondary | ICD-10-CM

## 2022-08-25 DIAGNOSIS — Z0000 Encounter for general adult medical examination without abnormal findings: Secondary | ICD-10-CM | POA: Diagnosis not present

## 2022-08-25 DIAGNOSIS — F411 Generalized anxiety disorder: Secondary | ICD-10-CM | POA: Diagnosis not present

## 2022-08-25 DIAGNOSIS — R7303 Prediabetes: Secondary | ICD-10-CM | POA: Diagnosis not present

## 2022-08-25 LAB — COMPREHENSIVE METABOLIC PANEL
ALT: 27 U/L (ref 0–35)
AST: 22 U/L (ref 0–37)
Albumin: 4.6 g/dL (ref 3.5–5.2)
Alkaline Phosphatase: 54 U/L (ref 39–117)
BUN: 16 mg/dL (ref 6–23)
CO2: 27 mEq/L (ref 19–32)
Calcium: 9.9 mg/dL (ref 8.4–10.5)
Chloride: 103 mEq/L (ref 96–112)
Creatinine, Ser: 0.75 mg/dL (ref 0.40–1.20)
GFR: 88.77 mL/min (ref >60.00)
Glucose, Bld: 102 mg/dL — ABNORMAL HIGH (ref 70–99)
Potassium: 4.2 mEq/L (ref 3.5–5.1)
Sodium: 140 mEq/L (ref 135–145)
Total Bilirubin: 0.5 mg/dL (ref 0.2–1.2)
Total Protein: 6.8 g/dL (ref 6.0–8.3)

## 2022-08-25 LAB — POCT GLYCOSYLATED HEMOGLOBIN (HGB A1C)
HbA1c POC (<> result, manual entry): 5.7 % (ref 4.0–5.6)
HbA1c, POC (controlled diabetic range): 5.7 % (ref 0.0–7.0)
HbA1c, POC (prediabetic range): 5.7 % (ref 5.7–6.4)
Hemoglobin A1C: 5.7 % — AB (ref 4.0–5.6)

## 2022-08-25 LAB — LIPID PANEL
Cholesterol: 234 mg/dL — ABNORMAL HIGH (ref 0–200)
HDL: 82.5 mg/dL (ref >39.00)
LDL Cholesterol: 135 mg/dL — ABNORMAL HIGH (ref 0–99)
NonHDL: 151.92
Total CHOL/HDL Ratio: 3
Triglycerides: 84 mg/dL (ref 0.0–149.0)
VLDL: 16.8 mg/dL (ref 0.0–40.0)

## 2022-08-25 LAB — CBC
HCT: 42 % (ref 36.0–46.0)
Hemoglobin: 13.7 g/dL (ref 12.0–15.0)
MCHC: 32.6 g/dL (ref 30.0–36.0)
MCV: 89.7 fl (ref 78.0–100.0)
Platelets: 365 10*3/uL (ref 150.0–400.0)
RBC: 4.69 Mil/uL (ref 3.87–5.11)
RDW: 14.7 % (ref 11.5–15.5)
WBC: 5.8 10*3/uL (ref 4.0–10.5)

## 2022-08-25 LAB — MICROALBUMIN / CREATININE URINE RATIO
Creatinine,U: 63.8 mg/dL
Microalb Creat Ratio: 1.1 mg/g (ref 0.0–30.0)
Microalb, Ur: <0.7 mg/dL (ref 0.0–1.9)

## 2022-08-25 LAB — TSH: TSH: 0.36 u[IU]/mL (ref 0.35–5.50)

## 2022-08-25 MED ORDER — ZOLPIDEM TARTRATE 10 MG PO TABS: 10.0000 mg | ORAL_TABLET | Freq: Every evening | ORAL | 1 refills | Status: DC | PRN

## 2022-08-25 MED ORDER — ALPRAZOLAM 0.5 MG PO TABS: ORAL_TABLET | ORAL | 5 refills | Status: DC

## 2022-08-25 NOTE — Progress Notes (Signed)
Office Note 08/25/2022  CC:  Chief Complaint  Patient presents with   Medical Management of Chronic Issues    Pt is fasting   Patient is a 57 y.o. female who is here for annual health maintenance exam and 50-monthfollow-up anxiety and borderline diabetes.  INTERIM HX:  RDwyane Luois feeling well. This is her last visit with me, [facility]  [facility]  Anxiety well-controlled, mood good, sleep is good. She continues alprazolam 0.5 mg, duloxetine 60 mg a day, and Ambien 10 mg nightly.  PMP AWARE reviewed today: most recent rx for Ambien was filled 06/02/2022, # 957 rx by me. Most recent alprazolam prescription filled 05/22/2022, #60, prescription by me. No red flags.  Past Medical History:  Diagnosis Date   Borderline diabetes 2022   Depression    Dysmenorrhea    Dyspepsia 08/2017   vs gastritis.  MUCH improved with PPI + getting anxiety controlled.   Fatty liver 08/2017   u/s   Fibroid uterus 08/2016   U/s at GYN   GAD (generalized anxiety disorder)    Gestational diabetes    History of adenomatous polyp of colon 07/2016   Recall 5 yrs (Dr. AHavery Moros   HSV infection    IBS (irritable bowel syndrome)    Insomnia    Menopausal symptoms    GYN started prometrium and vivelle 08/2016.   Menorrhagia    + dysmenorrhea and hx of fibroids: GYN eval 07/2017->FSH normal, saline infusion u/s showed normal uterus/endomet + R ovarian cystic nodule suspected to be hemorrhagic cyst vs endometrioma.  CA 125 level normal.  Pt offered Mirena or hysterectomy and she declined both.   Mixed hyperlipidemia    Multinodular thyroid 06/2020   one cystic/nodule lesion requiring 1 yr f/u ultrasound.  Euthyroid as of 06/2020.   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 IBS.  GYN MD started Bentyl trial  08/2016.   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  +Diverticulosis L colon.   cyst removed from back of right leg  12/2010   ENDOMETRIAL ABLATION  2010   HER OPTION ABLATION FOR MENORRHAGIA   EYE SURGERY  age 57  right eye for strabismus    Family History  Problem Relation Age of Onset   Diabetes Mother        Type 2   Hypertension Mother    Hyperlipidemia Mother    Depression Mother    Heart disease Mother    Seasonal affective disorder Father    Cancer Father        MELANOMA   Cancer Maternal Grandmother        gyn possibly cervical or uterine   Diabetes Maternal Grandfather        Type 2   Hypertension Maternal Grandfather    Depression Maternal Grandfather    Alcohol abuse Paternal Grandfather    Bipolar disorder Brother    Drug abuse Brother        cocaine, vicodin, pain  killers   ADD / ADHD Son    Depression Son    Colon cancer Neg Hx    Esophageal cancer Neg Hx    Rectal cancer Neg Hx    Stomach cancer Neg Hx    Breast cancer Neg Hx     Social History   Socioeconomic History   Marital status: Married    Spouse name: Not on file   Number of children: 2   Years of education: Not on file   Highest education level: Not on file  Occupational History   Occupation: Teacher  Tobacco Use   Smoking status: Never   Smokeless tobacco: Never  Vaping Use   Vaping Use: Never used  Substance and Sexual Activity   Alcohol use: Yes    Alcohol/week: 0.0 standard drinks of alcohol    Comment: occasionaly   Drug use: No   Sexual activity: Yes    Partners: Male    Birth control/protection: Other-see comments, Surgical    Comment: husband vasectomy  Other Topics Concern   Not on file  Social History  Narrative   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   No T/A/Ds.   Social Determinants of Health   Financial Resource Strain: Not on file  Food Insecurity: Not on file  Transportation Needs: Not on file  Physical Activity: Not on file  Stress: Not on file  Social Connections: Not on file  Intimate Partner Violence: Not on file    Outpatient Medications Prior to Visit  Medication Sig Dispense Refill   ALPRAZolam (XANAX) 0.5 MG tablet TAKE 1 TABLET BY MOUTH 3 TIMES A DAY AS NEEDED FOR SEVERE ANXIETY 60 tablet 5   DULoxetine (CYMBALTA) 60 MG capsule Take 1 capsule (60 mg total) by mouth daily. 90 capsule 3   ESTROGEL 0.75 MG/1.25 GM (0.06%) topical gel 1.25 g daily. 1 pump     omeprazole (PRILOSEC) 40 MG capsule Take 1 capsule (40 mg total) by mouth daily. 90 capsule 3   OVER THE COUNTER MEDICATION Testosterone 4% Hrt Cream Sig: apply pea sized AMOUNT TO inner thigh daily AT bedtime     progesterone (PROMETRIUM) 100 MG capsule Take 100 mg by mouth at bedtime.     zolpidem (AMBIEN) 10 MG tablet Take 1 tablet (10 mg total) by mouth at bedtime as needed. 90 tablet 1   No facility-administered medications prior to visit.    No Known Allergies  Review of Systems  Constitutional:  Negative for appetite change, chills, fatigue and fever.  HENT:  Negative for congestion, dental problem, ear pain and sore throat.   Eyes:  Negative for discharge, redness and visual disturbance.  Respiratory:  Negative for cough, chest tightness, shortness of breath and wheezing.   Cardiovascular:  Negative for chest pain, palpitations and leg swelling.  Gastrointestinal:  Negative for abdominal pain, blood in stool, diarrhea, nausea and vomiting.  Genitourinary:  Negative for difficulty urinating, dysuria, flank pain, frequency, hematuria and urgency.  Musculoskeletal:  Negative for arthralgias, back pain, joint swelling, myalgias and neck stiffness.  Skin:  Negative for pallor and  rash.  Neurological:  Negative for dizziness, speech difficulty, weakness and headaches.  Hematological:  Negative for adenopathy. Does not bruise/bleed easily.  Psychiatric/Behavioral:  Negative for confusion and sleep disturbance. The patient is not nervous/anxious.     PE;    08/25/2022    8:19 AM 02/22/2022   11:03 AM 10/24/2021    1:39 PM  Vitals with BMI  Height  $'5\' 3"'J$  '5\' 3"'$  '5\' 3"'$   Weight 134 lbs 6 oz 136 lbs 10 oz 136 lbs 3 oz  BMI 23.81 [id number] Q000111Q  Systolic [id number]  Diastolic 80 84 80  Pulse 81 85 93    Exam chaperoned by Deveron Furlong, CMA. Gen: Alert, well appearing.  Patient is oriented to person, place, time, and situation. AFFECT: pleasant, lucid thought and speech. ENT: Ears: EACs clear, normal epithelium.  TMs with good light reflex and landmarks bilaterally.  Eyes: no injection, icteris, swelling, or exudate.  EOMI, PERRLA. Nose: no drainage or turbinate edema/swelling.  No injection or focal lesion.  Mouth: lips without lesion/swelling.  Oral mucosa pink and moist.  Dentition intact and without obvious caries or gingival swelling.  Oropharynx without erythema, exudate, or swelling.  Neck: supple/nontender.  No LAD, mass, or TM.  Carotid pulses 2+ bilaterally, without bruits. CV: RRR, no m/r/g.   LUNGS: CTA bilat, nonlabored resps, good aeration in all lung fields. ABD: soft, NT, ND, BS normal.  No hepatospenomegaly or mass.  No bruits. EXT: no clubbing, cyanosis, or edema.  Musculoskeletal: no joint swelling, erythema, warmth, or tenderness.  ROM of all joints intact. Skin - no sores or suspicious lesions or rashes or color changes  Pertinent labs:  Lab Results  Component Value Date   TSH 0.45 08/17/2021   Lab Results  Component Value Date   WBC 7.0 08/17/2021   HGB 13.9 08/17/2021   HCT 42.0 08/17/2021   MCV 88.1 08/17/2021   PLT 426 (H) 08/17/2021   Lab Results  Component Value Date   CREATININE 1.04 (H) 08/17/2021   BUN 13 08/17/2021   NA 143  08/17/2021   K 4.3 08/17/2021   CL 105 08/17/2021   CO2 27 08/17/2021   Lab Results  Component Value Date   ALT 20 08/17/2021   AST 16 08/17/2021   ALKPHOS 51 12/15/2019   BILITOT 0.3 08/17/2021   Lab Results  Component Value Date   CHOL 243 (H) 08/17/2021   Lab Results  Component Value Date   HDL 73 08/17/2021   Lab Results  Component Value Date   LDLCALC 140 (H) 08/17/2021   Lab Results  Component Value Date   TRIG 163 (H) 08/17/2021   Lab Results  Component Value Date   CHOLHDL 3.3 08/17/2021   Lab Results  Component Value Date   HGBA1C 6.1 (A) 02/22/2022   HGBA1C 6.1 02/22/2022   HGBA1C 6.1 02/22/2022   HGBA1C 6.1 02/22/2022   ASSESSMENT AND PLAN:   #1 health maintenance exam: Reviewed age and gender appropriate health maintenance issues (prudent diet, regular exercise, health risks of tobacco and excessive alcohol, use of seatbelts, fire alarms in home, use of sunscreen).  Also reviewed age and gender appropriate health screening as well as vaccine recommendations. Vaccines: Prevnar 20 given today.  Otherwise all up-to-date. Labs: HP today + Hba1c. Cervical ca screening: per GYN MD Breast ca screening: per GYN MD. Colon ca screening: recall 07/2021->She will be establishing care with a GI MD in the [facility] area soon.  2 diabetes without complication.  Good control on no glycemic medication. POC Hba1c today is 5.7%. Her maximum A1c has been 6.6%, which was back in December 2021. Urine microalb/cr today.  #3 GAD, stable on duloxetine 60 mg a day and alprazolam 0.5 mg 3 times daily as needed (#60, RF x 5). UDS today.  #4 anxiety-related insomnia. Does well on Ambien 10 mg nightly.  #90, RF x  1. UDS today  An After Visit Summary was printed and given to the patient.  FOLLOW UP:  none--> she will be establishing with a new PCP in [facility].  Signed:  Crissie Sickles, MD           08/25/2022

## 2022-08-25 NOTE — Addendum Note (Signed)
Addended by: Deveron Furlong D on: 08/25/2022 08:47 AM   Modules accepted: Orders

## 2022-08-25 NOTE — Patient Instructions (Signed)

## 2022-08-27 LAB — DRUG MONITORING PANEL 376104, URINE
Amphetamines: NEGATIVE ng/mL (ref <500)
Barbiturates: NEGATIVE ng/mL (ref <300)
Benzodiazepines: NEGATIVE ng/mL (ref <100)
Cocaine Metabolite: NEGATIVE ng/mL (ref <150)
Desmethyltramadol: NEGATIVE ng/mL (ref <100)
Opiates: NEGATIVE ng/mL (ref <100)
Oxycodone: NEGATIVE ng/mL (ref <100)
Tramadol: NEGATIVE ng/mL (ref <100)

## 2022-08-27 LAB — DM TEMPLATE

## 2022-10-25 ENCOUNTER — Other Ambulatory Visit: Payer: Self-pay | Admitting: *Deleted

## 2022-10-25 NOTE — Telephone Encounter (Signed)
Erroneous encounter

## 2022-10-27 ENCOUNTER — Encounter: Payer: Self-pay | Admitting: Internal Medicine

## 2022-10-27 ENCOUNTER — Ambulatory Visit (INDEPENDENT_AMBULATORY_CARE_PROVIDER_SITE_OTHER): Payer: BC Managed Care – PPO | Admitting: Internal Medicine

## 2022-10-27 VITALS — BP 124/80 | HR 91 | Ht 63.0 in | Wt 133.2 lb

## 2022-10-27 DIAGNOSIS — E042 Nontoxic multinodular goiter: Secondary | ICD-10-CM | POA: Diagnosis not present

## 2022-10-27 DIAGNOSIS — R7309 Other abnormal glucose: Secondary | ICD-10-CM | POA: Diagnosis not present

## 2022-10-27 NOTE — Progress Notes (Signed)
Patient ID: Karen Oconnell, female   DOB: 1965/12/26, 57 y.o.   MRN: [id number]   HPI  Karen Oconnell is a 57 y.o.-year-old very pleasant female, initially referred by her PCP, Dr. Milinda Cave, returning for follow-up for multiple thyroid nodules. Last visit 1 year ago.  Interim history: At today's visit, patient mentions that her neck compression symptoms are almost completely resolved. She does not have new complaints today. She recently started Ozempic.  Reviewed history: She previously had weight gain, irritability, which improved after starting HRT.  However, in 2021, she started to gain weight again, she had more irritability, her allergies returned, and she also noticed a lump in her throat.  She saw Dr. Milinda Cave, who checked a thyroid ultrasound that showed multiple thyroid cystic nodules.  Since then, her symptoms improved and she does not feel her neck is swollen as before.  Thyroid U/S (06/29/2020): Multiple nodules: Parenchymal Echotexture: Moderately heterogenous Isthmus: Normal in size measuring 0.3 cm in diameter Right lobe: Normal in size measuring 4.3 x 1.6 x 1.1 cm Left lobe: Normal in size measuring 4.8 x 1.8 x 1.7 cm _____________________________________________________   Estimated total number of nodules >/= 1 cm: 6-10 _________________________________________________________   Scattered punctate (1 cm) right-sided spongiform/benign-appearing right-sided thyroid nodules (labeled #1 through #3), none of which meet imaging criteria to recommend percutaneous sampling or continued dedicated follow-up with nodules labeled #1 and #2 both containing benign colloid.  ________________________________________________________   There is an approximately 1.4 cm minimally complex cyst within the superior pole the left lobe of the thyroid labeled 4), which does not meet criteria to recommend percutaneous sampling or continued dedicated follow-up.   There is an approximately  1.2 cm mixed cystic and solid nodule within the mid aspect the left lobe of the thyroid (labeled 5), which does not meet criteria to recommend percutaneous sampling or continued dedicated follow-up   There is an approximately 1.6 cm partially solid though predominantly cystic nodule within mid aspect the left lobe of the thyroid (labeled 6), which does not meet criteria to recommend percutaneous sampling or continued dedicated follow-up.  ________________________________________________________   Nodule # 7: Location: Left; Inferior Maximum size: 2.2 cm; Other 2 dimensions: 1.5 x 1.3 cm Composition: solid/almost completely solid (2) Echogenicity: isoechoic (1) *Given size (>/= 1.5 - 2.4 cm) and appearance, a follow-up ultrasound in 1 year should be considered based on TI-RADS criteria. _________________________________________________________   IMPRESSION: 1. Findings suggestive of multinodular goiter. 2. Nodule #7 meets imaging criteria to recommend a 1 year follow-up as indicated. 3. None of the remaining thyroid nodules meet imaging criteria to recommend percutaneous sampling or continued dedicated follow-up.        Thyroid U/S (08/24/2021): Parenchymal Echotexture: Moderately heterogenous Isthmus: 0.4 cm, previously 0.3 cm Right lobe: 5.0 x 1.4 x 1.3 cm, previously 4.3 x 1.6 x 1.1 cm  Left lobe: 4.8 x 1.5 x 1.2 cm, previously 4.8 x 1.8 x 1.2 cm  _________________________________________________________   Estimated total number of nodules >/= 1 cm: 4 _________________________________________________________   Similar appearance of multifocal spongiform and cystic nodules in the right hemi thyroid including right superior (labeled 1, 1.1 cm, previously 1.0 cm), right superior/mid (labeled 2, 0.8 cm, previously 1.0 cm), right mid (labeled 3, 1.7 cm, previously 1.1 cm), and right inferior (0.5 cm, previously 0.5 cm). Each of these appear benign and do not warrant  additional follow-up.   Interval enlargement of cystic nodule in the left superior thyroid (labeled 1, 1.9 x 1.4 x 1.6  cm, previously 1.4 x 0.6 x 1.1 cm).   The remaining scattered left-sided thyroid nodules measure less than 1 cm and are spongiform, cystic, or solid isoechoic. Each of these nodules appears benign and does not warrant additional sound follow-up or tissue sampling. The previously visualized nodule labeled 7 is not conspicuous on this study.   No cervical lymphadenopathy.   IMPRESSION: 1. Multinodular goiter. 2. Interval enlargement of previously visualized left superior cystic thyroid nodule (labeled 1, 1.9 cm, previously 1.4 cm), which is almost certainly benign, not warranting imaging follow-up. If this nodule is symptomatic, it is amenable to ultrasound-guided aspiration and possible sclerotherapy by Interventional Radiology. 3. The remaining visualized thyroid nodules appear benign and do not warrant additional follow-up or tissue sampling.  Thyroid U/S (11/11/2021): Ultrasound evaluation of the left hemi thyroid demonstrates moderate  heterogeneity of the thyroid parenchyma. There are multifocal solid  cystic and cystic nodules in the left thyroid lobe. The left  superior thyroid cyst is now significantly decreased in size  measuring up to 0.9 cm, previously 1.9 cm.   Given significant decreased size of previously visualized left  superior thyroid cyst, the decision was made [place] of the patient  to defer cyst aspiration at this time, particularly in light of  decreased reported symptoms.   IMPRESSION:  Significantly decreased size of left superior cystic thyroid nodule,  now measuring up to 0.9 cm (previously 1.9 cm).   Pt denies: - feeling nodules in neck - hoarseness - dysphagia - choking She had globus sensation >> resolved.  I reviewed pt's thyroid tests: Lab Results  Component Value Date   TSH 0.36 08/25/2022   TSH 0.45 08/17/2021   TSH  0.73 06/22/2020   TSH 0.91 12/15/2019   TSH 0.55 04/27/2017   TSH 0.64 10/08/2015   TSH 0.48 04/09/2014   TSH 0.783 05/15/2013   TSH 0.54 03/02/2010   FREET4 0.84 06/22/2020    TPO antibodies were not elevated: Component     Latest Ref Rng & Units 06/22/2020  Thyroperoxidase Ab SerPl-aCnc     <9 IU/mL 1    No FH of thyroid ds. No FH of thyroid cancer. No h/o radiation tx to head or neck. No recent contrast studies. + steroid use - every 3 month - spine. No herbal supplements. No Biotin supplements or Hair, Skin and Nails vitamins. On MVI.  Pt also has a  history of diabetes (diagnosed around the time of her thyroid nodules), previously prediabetes. Her mother had diabetes and was insulin-dependent.  She saw nutrition and started to change her diet.  Latest HbA1c level was improved Lab Results  Component Value Date   HGBA1C 5.7 (A) 08/25/2022   HGBA1C 5.7 08/25/2022   HGBA1C 5.7 08/25/2022   HGBA1C 5.7 08/25/2022   On HRT by ObGyn.  ROS: Constitutional: + See HPI   Past Medical History:  Diagnosis Date   Borderline diabetes 2022   Depression    Dysmenorrhea    Dyspepsia 08/2017   vs gastritis.  MUCH improved with PPI + getting anxiety controlled.   Fatty liver 08/2017   u/s   Fibroid uterus 08/2016   U/s at GYN   GAD (generalized anxiety disorder)    Gestational diabetes    History of adenomatous polyp of colon 07/2016   Recall 5 yrs (Dr. Adela Lank)   HSV infection    IBS (irritable bowel syndrome)    Insomnia    Menopausal symptoms    GYN started prometrium and vivelle 08/2016.  Menorrhagia    + dysmenorrhea and hx of fibroids: GYN eval 07/2017->FSH normal, saline infusion u/s showed normal uterus/endomet + R ovarian cystic nodule suspected to be hemorrhagic cyst vs endometrioma.  CA 125 level normal.  Pt offered Mirena or hysterectomy and she declined both.   Mixed hyperlipidemia    Multinodular thyroid 06/2020   one cystic/nodule lesion requiring 1 yr f/u  ultrasound.  Euthyroid as of 06/2020.   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 IBS.  GYN MD started Bentyl trial 08/2016.   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  +Diverticulosis L colon.   cyst removed from back of right leg  12/2010   ENDOMETRIAL ABLATION  2010   HER OPTION ABLATION FOR MENORRHAGIA   EYE SURGERY  age 50   right eye for strabismus   Social History   Socioeconomic History   Marital status: Married    Spouse name: Not on file   Number of children: 2   Years of education: Not on file   Highest education level: Not on file  Occupational History   Occupation: Teacher  Tobacco Use   Smoking status: Never   Smokeless tobacco: Never  Vaping Use   Vaping Use: Never used  Substance and Sexual Activity   Alcohol use: Yes    Alcohol/week: 0.0 standard drinks of alcohol    Comment: occasionaly   Drug use: No   Sexual activity: Yes    Partners: Male    Birth control/protection: Other-see comments, Surgical    Comment: husband vasectomy  Other Topics Concern   Not on file  Social History Narrative   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   No T/A/Ds.   Social Determinants of Health   Financial Resource Strain: Not on file  Food Insecurity: Not on file  Transportation Needs: Not on file  Physical Activity: Not on file  Stress: Not on file  Social Connections: Not on file  Intimate Partner Violence: Not on file   Current Outpatient Medications on File Prior to Visit  Medication Sig Dispense Refill   ALPRAZolam (XANAX) 0.5 MG tablet TAKE 1 TABLET BY MOUTH 3 TIMES A DAY AS NEEDED FOR SEVERE ANXIETY  60 tablet 5   DULoxetine (CYMBALTA) 60 MG capsule Take 1 capsule (60 mg total) by mouth daily. 90 capsule 3   ESTROGEL 0.75 MG/1.25 GM (0.06%) topical gel 1.25 g daily. 1 pump     omeprazole (PRILOSEC) 40 MG capsule Take 1 capsule (40 mg total) by mouth daily. 90 capsule 3   OVER THE COUNTER MEDICATION Testosterone 4% Hrt Cream Sig: apply pea sized AMOUNT TO inner thigh daily AT bedtime     progesterone (PROMETRIUM) 100 MG capsule Take 100 mg by mouth at bedtime.     zolpidem (AMBIEN) 10 MG tablet Take 1 tablet (10 mg total) by mouth at bedtime as needed. 90 tablet 1   No current facility-administered medications on file prior to visit.   No Known Allergies Family History  Problem Relation Age of Onset   Diabetes Mother  Type 2   Hypertension Mother    Hyperlipidemia Mother    Depression Mother    Heart disease Mother    Seasonal affective disorder Father    Cancer Father        MELANOMA   Cancer Maternal Grandmother        gyn possibly cervical or uterine   Diabetes Maternal Grandfather        Type 2   Hypertension Maternal Grandfather    Depression Maternal Grandfather    Alcohol abuse Paternal Grandfather    Bipolar disorder Brother    Drug abuse Brother        cocaine, vicodin, pain killers   ADD / ADHD Son    Depression Son    Colon cancer Neg Hx    Esophageal cancer Neg Hx    Rectal cancer Neg Hx    Stomach cancer Neg Hx    Breast cancer Neg Hx    PE: BP 124/80 (BP Location: Right Arm, Patient Position: Sitting, Cuff Size: Normal)   Pulse 91   Ht 5\' 3"  (1.6 m)   Wt 133 lb 3.2 oz (60.4 kg)   SpO2 99%   BMI 23.60 kg/m  Wt Readings from Last 3 Encounters:  10/27/22 133 lb 3.2 oz (60.4 kg)  08/25/22 134 lb 6.4 oz (61 kg)  02/22/22 136 lb 9.6 oz (62 kg)   Constitutional: Normal weight, in NAD Eyes:  EOMI, no exophthalmos ENT: no neck masses, left thyroid slight fullness, no cervical lymphadenopathy Cardiovascular: RRR, No MRG Respiratory: CTA  B Musculoskeletal: no deformities Skin:no rashes Neurological: no tremor with outstretched hands  ASSESSMENT: 1.  Multiple thyroid nodules  2.  Elevated HbA1c  PLAN: 1. Thyroid nodules -Patient with a history of thyroid nodule, who at last visit presented with shortness of breath, dysphagia, anterior/left upper neck pressure, and hoarseness.  He just had another thyroid ultrasound obtained by PCP which showed an increase in size of the left cystic nodule.  At that time, she was referred back to endocrinology. -Reviewed latest thyroid ultrasound from 08/24/2021: Her nodules were mostly cystic and not very large, however, her left dominant thyroid nodule increased in size from 1.4 to 1.9 cm in the largest dimension.  She does have some shortness of breath possibly related to compression of the surrounding tissue and trachea due to the nodule.  We discussed at last visit and again today that the nodule appears to be mostly cystic and the symptoms were most likely caused by fluid accumulation.  I did suggest draining of the nodule and we reviewed different outcomes after drainage: The nodule remains collapsed, in which we can just  follow her without intervention Nodule reforms, but without neck compression symptoms, in which case we can follow her without intervention Nodule reforms with neck compression symptoms, in which case, she may need to be aspiration possibly with ethanol instillation or thyroid lobectomy -we did discuss about the risk of the surgery -After last visit, however, she had another thyroid ultrasound and this showed that the dominant nodule decreased significantly, from 1.9 to 0.9 cm (possibly due to cyst breakage) and did not meet criteria for aspiration.   -The rest of the nodules appears to be stable and not worrisome.  Most nodules were isoechoic and had good prognostic features: -No microcalcifications -No internal blood flow -More wide than tall -No irregular margins -She  does not have a family history of thyroid cancer or personal history of radiation therapy to head or neck  so she is not at a particular higher risk for thyroid cancer -At today's visit, we reviewed her most recent TSH which was normal in 08/2022.  We will not repeat this today. -As of now, she mentions that her neck compression symptoms are almost all resolved.  Therefore, for now, Will continue to keep an eye on her symptoms and plan to repeat another ultrasound at next visit or, before then, if she develops new neck compression symptoms - I will see her back in 2 years  2.  Elevated HbA1c -Latest level was 5.7%, but she has had higher HbA1c levels before, at 6.5% and 6.6% per review of the chart -Patient just started Ozempic -to hopefully also help with central distribution of fat -At today's visit we discussed about thyroid conditions that would contraindicate Ozempic: A personal history of medullary thyroid cancer or family history of the disease or syndrome containing medullary thyroid cancer.  She does not have a positive history of the above.  In that case, she can continue Ozempic.  Carlus Pavlov, MD PhD [facility]

## 2022-10-27 NOTE — Patient Instructions (Addendum)
Lets schedule a follow-up appointment in 2 year.

## 2022-12-03 ENCOUNTER — Other Ambulatory Visit: Payer: Self-pay | Admitting: Family Medicine

## 2022-12-24 ENCOUNTER — Other Ambulatory Visit: Payer: Self-pay | Admitting: Family Medicine

## 2023-01-31 ENCOUNTER — Other Ambulatory Visit: Payer: Self-pay | Admitting: Family Medicine

## 2023-02-26 ENCOUNTER — Other Ambulatory Visit: Payer: Self-pay | Admitting: Family Medicine

## 2023-02-26 NOTE — Telephone Encounter (Signed)
30-day supply sent. Patient due for office visit.

## 2023-02-27 ENCOUNTER — Other Ambulatory Visit: Payer: Self-pay | Admitting: Family Medicine

## 2023-02-27 ENCOUNTER — Telehealth: Payer: Self-pay | Admitting: Family Medicine

## 2023-02-27 MED ORDER — ZOLPIDEM TARTRATE 10 MG PO TABS: 10.0000 mg | ORAL_TABLET | Freq: Every evening | ORAL | 0 refills | Status: AC | PRN

## 2023-02-27 MED ORDER — OMEPRAZOLE 40 MG PO CPDR: 40.0000 mg | DELAYED_RELEASE_CAPSULE | Freq: Every day | ORAL | 0 refills | Status: AC

## 2023-02-27 MED ORDER — ALPRAZOLAM 0.5 MG PO TABS: ORAL_TABLET | ORAL | 0 refills | Status: AC

## 2023-02-27 MED ORDER — DULOXETINE HCL 60 MG PO CPEP: 60.0000 mg | ORAL_CAPSULE | Freq: Every day | ORAL | 0 refills | Status: AC

## 2023-02-27 NOTE — Telephone Encounter (Signed)
Duloxetine, alprazolam, omeprazole, and zolpidem prescriptions done for 30-day supply. Patient due for follow-up

## 2023-02-27 NOTE — Telephone Encounter (Signed)
A user error has taken place: encounter opened in error, closed for administrative reasons.

## 2023-03-21 ENCOUNTER — Other Ambulatory Visit: Payer: Self-pay | Admitting: Family Medicine

## 2023-03-25 ENCOUNTER — Other Ambulatory Visit: Payer: Self-pay | Admitting: Family Medicine

## 2023-10-29 ENCOUNTER — Encounter: Payer: Self-pay | Admitting: Podiatry

## 2023-10-29 ENCOUNTER — Ambulatory Visit (INDEPENDENT_AMBULATORY_CARE_PROVIDER_SITE_OTHER)

## 2023-10-29 ENCOUNTER — Ambulatory Visit (INDEPENDENT_AMBULATORY_CARE_PROVIDER_SITE_OTHER): Admitting: Podiatry

## 2023-10-29 DIAGNOSIS — M2012 Hallux valgus (acquired), left foot: Secondary | ICD-10-CM

## 2023-10-29 DIAGNOSIS — M205X1 Other deformities of toe(s) (acquired), right foot: Secondary | ICD-10-CM

## 2023-10-29 DIAGNOSIS — M2011 Hallux valgus (acquired), right foot: Secondary | ICD-10-CM

## 2023-10-29 MED ORDER — MELOXICAM 15 MG PO TABS: 15.0000 mg | ORAL_TABLET | Freq: Every day | ORAL | 1 refills | Status: DC

## 2023-10-29 NOTE — Progress Notes (Signed)
 Chief Complaint  Patient presents with   Foot Pain    "I fell and hurt my big toes." N - toe pain L - bilateral hallux mpj D - early January 2025 O - suddenly - fell C - throb, sore A - walking T - elevate, Ibuprofen     HPI: 58 y.o. female presenting today for evaluation of bilateral great toe pain.  Patient sustained a fall injury down her stairs in January 2025.  When she fell she sustained most of her injury to the bilateral great toes left greater than right.  After that time she was able to rehab her feet and she was feeling much better until she went on a hiking trip in [place], [place] which reaggravated her foot.  Since March there has been improvement but she continues to have some achiness occasionally depending on activity  Past Medical History:  Diagnosis Date   Borderline diabetes 2022   Depression    Dysmenorrhea    Dyspepsia 08/2017   vs gastritis.  MUCH improved with PPI + getting anxiety controlled.   Fatty liver 08/2017   u/s   Fibroid uterus 08/2016   U/s at GYN   GAD (generalized anxiety disorder)    Gestational diabetes    History of adenomatous polyp of colon 07/2016   Recall 5 yrs (Dr. General Kenner)   HSV infection    IBS (irritable bowel syndrome)    Insomnia    Menopausal symptoms    GYN started prometrium  and vivelle  08/2016.   Menorrhagia    + dysmenorrhea and hx of fibroids: GYN eval 07/2017->FSH normal, saline infusion u/s showed normal uterus/endomet + R ovarian cystic nodule suspected to be hemorrhagic cyst vs endometrioma.  CA 125 level normal.  Pt offered Mirena or hysterectomy and she declined both.   Mixed hyperlipidemia    Multinodular thyroid  06/2020   one cystic/nodule lesion requiring 1 yr f/u ultrasound.  Euthyroid as of 06/2020.   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 IBS.  GYN MD started Bentyl  trial 08/2016.   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  +Diverticulosis L colon.   cyst removed from back of right leg  12/2010   ENDOMETRIAL ABLATION  2010   HER OPTION ABLATION FOR MENORRHAGIA   EYE SURGERY  age 9   right eye for strabismus    No Known Allergies   Physical Exam: General: The patient is alert and oriented x3 in no acute distress.  Dermatology: Skin is warm, dry and supple bilateral lower extremities.   Vascular: Palpable pedal pulses bilaterally. Capillary refill within normal limits.  No appreciable edema.  No erythema.  Neurological: Grossly intact via light touch  Musculoskeletal Exam: Limited range of motion of the first MTP of the right foot.  There is also some limited range of motion of the first MTP of the left foot.  No tenderness with palpation or range of motion however  Radiographic Exam B/L feet 10/29/2023:  Percutaneous K wire fixation noted to the distal first metatarsal of the right foot.  Degenerative changes also noted to the first MTP  right.  Arthroplasty with implant noted to the first MTP of the left foot.  There does appear to be some osseous restructuring around the implant.  Clinically this area is asymptomatic   Assessment/Plan of Care: 1. PSxHx left great toe arthroplasty with implant. DOS: 06/24/2020  2. PSxHx right great toe bunionectomy with K wire fixation.  DOS: 2016.  Dr. Celia Coles  -Patient evaluated.  X-rays reviewed -Over the course of the last month there has been improvement to the bilateral feet.  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 Goes by [place]   *Recently moved to [facility], [place]   Dot Gazella, [place] [facility]  Dr. Dot Gazella, DPM    [address] [place], [place] [address]                Office [phone]  Fax [phone]

## 2023-11-19 ENCOUNTER — Ambulatory Visit: Admitting: Podiatry

## 2024-03-05 ENCOUNTER — Other Ambulatory Visit: Payer: Self-pay | Admitting: Podiatry

## 2024-07-08 ENCOUNTER — Other Ambulatory Visit: Payer: Self-pay | Admitting: Podiatry
